# Patient Record
Sex: Male | Born: 1970 | Race: White | Hispanic: No | Marital: Single | Smoking: Never smoker
Health system: Southern US, Community
[De-identification: ages and names within clinical notes are randomized; demographics above are authoritative.]

## PROBLEM LIST (undated history)

## (undated) DIAGNOSIS — E559 Vitamin D deficiency, unspecified: Secondary | ICD-10-CM

## (undated) DIAGNOSIS — T4145XA Adverse effect of unspecified anesthetic, initial encounter: Secondary | ICD-10-CM

## (undated) DIAGNOSIS — R351 Nocturia: Secondary | ICD-10-CM

## (undated) DIAGNOSIS — M1990 Unspecified osteoarthritis, unspecified site: Secondary | ICD-10-CM

## (undated) DIAGNOSIS — G4700 Insomnia, unspecified: Secondary | ICD-10-CM

## (undated) DIAGNOSIS — K589 Irritable bowel syndrome without diarrhea: Secondary | ICD-10-CM

## (undated) DIAGNOSIS — F54 Psychological and behavioral factors associated with disorders or diseases classified elsewhere: Secondary | ICD-10-CM

## (undated) DIAGNOSIS — I10 Essential (primary) hypertension: Secondary | ICD-10-CM

## (undated) DIAGNOSIS — F329 Major depressive disorder, single episode, unspecified: Secondary | ICD-10-CM

## (undated) DIAGNOSIS — Z9109 Other allergy status, other than to drugs and biological substances: Secondary | ICD-10-CM

## (undated) DIAGNOSIS — T8859XA Other complications of anesthesia, initial encounter: Secondary | ICD-10-CM

## (undated) DIAGNOSIS — F32A Depression, unspecified: Secondary | ICD-10-CM

## (undated) DIAGNOSIS — R631 Polydipsia: Secondary | ICD-10-CM

## (undated) DIAGNOSIS — R51 Headache: Secondary | ICD-10-CM

## (undated) DIAGNOSIS — R531 Weakness: Secondary | ICD-10-CM

## (undated) HISTORY — DX: Psychological and behavioral factors associated with disorders or diseases classified elsewhere: F54

## (undated) HISTORY — PX: COLONOSCOPY: SHX174

## (undated) HISTORY — DX: Polydipsia: R63.1

## (undated) HISTORY — DX: Irritable bowel syndrome, unspecified: K58.9

---

## 1999-08-12 HISTORY — PX: WISDOM TOOTH EXTRACTION: SHX21

## 2001-08-24 ENCOUNTER — Emergency Department (HOSPITAL_COMMUNITY): Admission: EM | Admit: 2001-08-24 | Discharge: 2001-08-24 | Payer: Self-pay | Admitting: Emergency Medicine

## 2005-08-18 ENCOUNTER — Ambulatory Visit: Payer: Self-pay | Admitting: Internal Medicine

## 2005-08-31 ENCOUNTER — Ambulatory Visit: Payer: Self-pay | Admitting: Gastroenterology

## 2005-09-28 ENCOUNTER — Ambulatory Visit: Payer: Self-pay | Admitting: Gastroenterology

## 2006-03-21 ENCOUNTER — Ambulatory Visit: Payer: Self-pay | Admitting: Internal Medicine

## 2006-11-01 ENCOUNTER — Emergency Department (HOSPITAL_COMMUNITY): Admission: EM | Admit: 2006-11-01 | Discharge: 2006-11-01 | Payer: Self-pay | Admitting: Emergency Medicine

## 2007-12-27 ENCOUNTER — Ambulatory Visit: Payer: Self-pay | Admitting: Internal Medicine

## 2008-01-01 LAB — CONVERTED CEMR LAB
Basophils Absolute: 0 10*3/uL (ref 0.0–0.1)
Bilirubin, Direct: 0.1 mg/dL (ref 0.0–0.3)
CO2: 29 meq/L (ref 19–32)
Creatinine, Ser: 0.9 mg/dL (ref 0.4–1.5)
Eosinophils Absolute: 0.1 10*3/uL (ref 0.0–0.6)
Eosinophils Relative: 1.9 % (ref 0.0–5.0)
GFR calc Af Amer: 123 mL/min
Glucose, Bld: 106 mg/dL — ABNORMAL HIGH (ref 70–99)
HCT: 42.1 % (ref 39.0–52.0)
Hemoglobin: 15.1 g/dL (ref 13.0–17.0)
Iron: 96 ug/dL (ref 42–165)
Lipase: 22 units/L (ref 11.0–59.0)
Lymphocytes Relative: 32.1 % (ref 12.0–46.0)
MCHC: 36 g/dL (ref 30.0–36.0)
MCV: 90.7 fL (ref 78.0–100.0)
Monocytes Absolute: 0.6 10*3/uL (ref 0.2–0.7)
Neutro Abs: 3 10*3/uL (ref 1.4–7.7)
Neutrophils Relative %: 54.9 % (ref 43.0–77.0)
Potassium: 4.6 meq/L (ref 3.5–5.1)
Total Bilirubin: 0.7 mg/dL (ref 0.3–1.2)
Total Protein: 7.4 g/dL (ref 6.0–8.3)

## 2008-03-03 ENCOUNTER — Encounter: Payer: Self-pay | Admitting: Internal Medicine

## 2010-05-11 ENCOUNTER — Ambulatory Visit: Payer: Self-pay | Admitting: Internal Medicine

## 2010-05-11 DIAGNOSIS — R350 Frequency of micturition: Secondary | ICD-10-CM | POA: Insufficient documentation

## 2010-05-11 DIAGNOSIS — R109 Unspecified abdominal pain: Secondary | ICD-10-CM | POA: Insufficient documentation

## 2010-05-11 LAB — CONVERTED CEMR LAB
Blood in Urine, dipstick: NEGATIVE
Ketones, urine, test strip: NEGATIVE
Nitrite: NEGATIVE
Specific Gravity, Urine: 1.01
Urobilinogen, UA: 0.2
WBC Urine, dipstick: NEGATIVE

## 2010-05-12 LAB — CONVERTED CEMR LAB
Basophils Absolute: 0 10*3/uL (ref 0.0–0.1)
Basophils Relative: 0.3 % (ref 0.0–3.0)
Eosinophils Absolute: 0.1 10*3/uL (ref 0.0–0.7)
Hemoglobin: 15.2 g/dL (ref 13.0–17.0)
Lymphocytes Relative: 27.3 % (ref 12.0–46.0)
MCHC: 34.6 g/dL (ref 30.0–36.0)
MCV: 92.8 fL (ref 78.0–100.0)
Monocytes Absolute: 0.6 10*3/uL (ref 0.1–1.0)
Neutro Abs: 5.4 10*3/uL (ref 1.4–7.7)
RBC: 4.75 M/uL (ref 4.22–5.81)
RDW: 13.4 % (ref 11.5–14.6)

## 2010-08-10 ENCOUNTER — Encounter (INDEPENDENT_AMBULATORY_CARE_PROVIDER_SITE_OTHER): Payer: Self-pay | Admitting: *Deleted

## 2010-09-05 ENCOUNTER — Ambulatory Visit: Payer: Self-pay | Admitting: Internal Medicine

## 2010-09-05 DIAGNOSIS — L255 Unspecified contact dermatitis due to plants, except food: Secondary | ICD-10-CM | POA: Insufficient documentation

## 2011-01-10 NOTE — Assessment & Plan Note (Signed)
Summary: DR PAZ PT---POISON IVY 2-3 WEEKS///SP[facility]   Vital Signs:  Patient profile:   40 year old male Height:      66 inches Weight:      198.4 pounds BMI:     32.14 Temp:     98.5 degrees F oral Pulse rate:   72 / minute Resp:     13 per minute BP sitting:   118 / 76  (left arm) Cuff size:   large  Vitals Entered By: Shonna Chock CMA (September 05, 2010 1:34 PM) CC: ? 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  History of Present Illness: Poison ivy since 2 weeks ago after yardwork LLE. Rash has progressed despite  topical hydrocortisone OTC & OTC "wash". Groin rash in past 3 days ; PMH of "jock itch". No PMH of DM.  Current Medications (verified): 1)  None  Allergies (verified): No Known Drug Allergies  Review of Systems General:  Denies chills, fever, and sweats. Resp:  Denies shortness of breath and wheezing.  Physical Exam  General:  in no acute distress; alert,appropriate and cooperative throughout examination Lungs:  Normal respiratory effort, chest expands symmetrically. Lungs are clear to auscultation, no crackles or wheezes. Heart:  Normal rate and regular rhythm. S1 and S2 normal without gallop, murmur, click, rub or other extra sounds. Pulses:  R and L dorsalis pedis and posterior tibial pulses are full and equal bilaterally Extremities:  No clubbing, cyanosis, edema. Skin:  13X 5 cm irregular rash LLE above & posterior to ankle . ?Candida  R groin . Scattered papules . Scattered folliculitis lesions over abdomen Cervical Nodes:  No lymphadenopathy noted Axillary Nodes:  No palpable lymphadenopathy Psych:  memory intact for recent and remote, normally interactive, and good eye contact.     Impression & Recommendations:  Problem # 1:  RHUS DERMATITIS (ICD-692.6)  LLE but folliculitis lesions abdomen & ? Candida R groin  His updated medication list for this problem includes:    Mometasone Furoate 0.1 % Oint (Mometasone furoate) .Marland Kitchen... Apply two times a day to rash   Prednisone (pak) 10 Mg Tabs (Prednisone) .Marland Kitchen... As per pack ( 6 day)  Complete Medication List: 1)  Hydroxyzine Pamoate 25 Mg Caps (Hydroxyzine pamoate) .Marland Kitchen.. 1 every 6 hrs as needed itching 2)  Mometasone Furoate 0.1 % Oint (Mometasone furoate) .... Apply two times a day to rash 3)  Prednisone (pak) 10 Mg Tabs (Prednisone) .... As per pack ( 6 day)  Patient Instructions: 1)  Use OTC  Lamisil two times a day & blow dry for groin rash. Prescriptions: PREDNISONE (PAK) 10 MG TABS (PREDNISONE) as per pack ( 6 day)  #1 x 0   Entered and Authorized by:   Marga Melnick MD   Signed by:   Marga Melnick MD on 09/05/2010   Method used:   Print then Give to Patient   RxID:   [id number] MOMETASONE FUROATE 0.1 % OINT (MOMETASONE FUROATE) apply two times a day to rash  #60 grams x 0   Entered and Authorized by:   Marga Melnick MD   Signed by:   Marga Melnick MD on 09/05/2010   Method used:   Print then Give to Patient   RxID:   [id number] HYDROXYZINE PAMOATE 25 MG CAPS (HYDROXYZINE PAMOATE) 1 every 6 hrs as needed itching  #30 x 0   Entered and Authorized by:   Marga Melnick MD   Signed by:   Marga Melnick MD on 09/05/2010  Method used:   Print then Give to Patient   RxID:   [id number]

## 2011-01-10 NOTE — Letter (Signed)
Summary: Colonoscopy Letter  [facility]  [address] [place], [place] [address]   Phone: [phone]  Fax: [phone]      August 10, 2010 MRN: [id number]   [facility] [address] [place], [place]  [address]   Dear Mr. Reamy,   According to your medical record, it is time for you to schedule a Colonoscopy. The American Cancer Society recommends this procedure as a method to detect early colon cancer. Patients with a family history of colon cancer, or a personal history of colon polyps or inflammatory bowel disease are at increased risk.  This letter has beeen generated based on the recommendations made at the time of your procedure. If you feel that in your particular situation this may no longer apply, please contact our office.  Please call our office at [phone] to schedule this appointment or to update your records at your earliest convenience.  Thank you for cooperating with [place] to provide you with the very best care possible.   Sincerely,  Judie Petit T. Russella Dar, M.D.  [facility] [phone]

## 2011-01-10 NOTE — Assessment & Plan Note (Signed)
Summary: possible kidney problems//lch   Vital Signs:  Patient profile:   40 year old male Weight:      200.4 pounds Temp:     97.9 degrees F oral Pulse rate:   68 / minute Resp:     16 per minute BP sitting:   130 / 88  (left arm) Cuff size:   large  Vitals Entered By: Shonna Chock (May 11, 2010 1:21 PM) CC: 1.) Frequent Urination  2.) IBS Concerns   3.) Pain in rib area, Abdominal pain Comments REVIEWED MED LIST, PATIENT AGREED DOSE AND INSTRUCTION CORRECT    CC:  1.) Frequent Urination  2.) IBS Concerns   3.) Pain in rib area and Abdominal pain.  History of Present Illness:  Abdominal Pain      This is a 40 year old man who presents with Abdominal pain, acute on background of IBS.  The patient reports  loose stool w/o frank diarrhea, but denies nausea, vomiting, constipation ( until he took Immodium), melena, hematochezia, anorexia, and hematemesis.  The location of the pain is left posterior flank  / sidet.  The pain is described as intermittent and dull.  The patient denies the following symptoms: fever, weight loss, dysuria, chest pain, jaundice, and dark urine.  The pain was ?  worse with fruit .  The pain is better with fasting. Probiotics have helped but he stopped due to cost. No constellation of headache, chest pain, flushing & diarrhea.  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 ENT:  Denies difficulty swallowing and hoarseness. GI:  Denies excessive appetite, gas, indigestion, and loss of appetite. GU:  Denies discharge, hematuria, incontinence, and urinary frequency; Nocturia 1x. MS:  Complains of low  back pain and mid back pain; He sees [facility] monthly. Derm:  Denies lesion(s) and rash. Neuro:  Denies brief paralysis, numbness, tingling, and weakness; No radicualr pain. Psych:  Complains of anxiety; denies depression, easily angered, easily tearful, and irritability; Job stresses; OTC helped.  Physical Exam  General:  in no acute distress; alert,appropriate and cooperative throughout examination Eyes:  No corneal or conjunctival inflammation noted.No icterus Mouth:  Oral mucosa and oropharynx without lesions or exudates.  Teeth in good repair. No pharyngeal erythema.   Lungs:  Normal respiratory effort, chest expands symmetrically. Lungs are clear to auscultation, no crackles or wheezes. Heart:  Normal rate and regular rhythm. S1 and S2 normal without gallop, murmur, click, rub or other extra sounds. Abdomen:  Bowel sounds positive,abdomen soft and non-tender without masses, organomegaly or hernias noted. Msk:  Slight L flank tenderness to percussion Extremities:  No clubbing, cyanosis, edema. Boss dorsum of R hand ("from abrasion repeatedly"). Neg SLR Neurologic:  alert & oriented X3, strength normal in all extremities, and DTRs symmetrical and normal.   Skin:  Intact without suspicious lesions or rashes Cervical Nodes:  No lymphadenopathy noted Axillary Nodes:  No palpable lymphadenopathy Psych:  memory intact for recent and remote, normally interactive, and good eye contact.     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 1 under tongue every 6 hrs as needed for flank or abdominal pain  Patient Instructions: 1)  Monitor for  possible Gluten intolerance. Continue Probiotic once daily until bowels are normal. Prescriptions: HYOSCYAMINE SULFATE 0.125 MG SUBL (HYOSCYAMINE SULFATE) 1 under tongue every 6 hrs as needed for flank or abdominal pain  #30 x 5   Entered and Authorized by:   Marga Melnick MD   Signed by:   Marga Melnick MD on 05/11/2010   Method used:   Print then Give to Patient   RxID:   [id number]   Laboratory Results   Urine Tests    Routine Urinalysis   Color: yellow Appearance: Clear Glucose: negative   (Normal Range: Negative) Bilirubin: negative   (Normal Range: Negative) Ketone: negative   (Normal Range: Negative) Spec. Gravity: 1.010   (Normal Range: 1.003-1.035) Blood: negative   (Normal Range: Negative) pH: 6.5   (Normal Range: 5.0-8.0) Protein: negative   (Normal Range: Negative) Urobilinogen: 0.2   (Normal Range: 0-1) Nitrite: negative   (Normal Range: Negative) Leukocyte Esterace: negative   (Normal Range: Negative)

## 2011-01-26 ENCOUNTER — Encounter: Payer: Self-pay | Admitting: Internal Medicine

## 2011-02-03 ENCOUNTER — Encounter: Payer: Self-pay | Admitting: Internal Medicine

## 2011-02-21 NOTE — Letter (Signed)
Summary: [facility]  [facility]   Imported By: Maryln Gottron 02/14/2011 14:08:37  _____________________________________________________________________  External Attachment:    Type:   Image     Comment:   External Document

## 2011-03-13 ENCOUNTER — Ambulatory Visit (INDEPENDENT_AMBULATORY_CARE_PROVIDER_SITE_OTHER): Payer: Self-pay | Admitting: Internal Medicine

## 2011-03-13 ENCOUNTER — Encounter: Payer: Self-pay | Admitting: Internal Medicine

## 2011-03-13 DIAGNOSIS — L259 Unspecified contact dermatitis, unspecified cause: Secondary | ICD-10-CM

## 2011-03-13 MED ORDER — PREDNISONE 10 MG PO TABS: ORAL_TABLET | ORAL | Status: AC

## 2011-03-13 MED ORDER — HYDROCORTISONE 2.5 % EX CREA: TOPICAL_CREAM | Freq: Two times a day (BID) | CUTANEOUS | Status: DC

## 2011-03-13 NOTE — Assessment & Plan Note (Signed)
Symptoms consistent with contact dermatitis, recommend to wash all his clothing and clean everything that was in contact with poison ivy. see prescriptions Will call if no better

## 2011-03-13 NOTE — Patient Instructions (Signed)
Call if no better in few days

## 2011-03-13 NOTE — Progress Notes (Signed)
  Subjective:    Patient ID: Edwin Vaughan, male    DOB: 01-12-1971, 40 y.o.   MRN: [id number]  HPI Was in contact  with poison ivy 4 weeks ago, since then having a  on and off pruritic rash Has used a steroid cream with some help.  Past Medical History  Diagnosis Date  . IBS (irritable bowel syndrome)    Past Surgical History  Procedure Date  . Wisdom tooth extraction    History   Social History  . Marital Status: Single    Spouse Name: N/A    Number of Children: 0  . Years of Education: N/A   Occupational History  . Not on file.   Social History Main Topics  . Smoking status: Never Smoker   . Smokeless tobacco: Not on file  . Alcohol Use: 0.0 oz/week     drinks rarely   . Drug Use: Not on file  . Sexually Active: Not on file   Other Topics Concern  . Not on file   Social History Narrative   No job at present,  Single,  No children       Review of Systems No fevers, nor trauma chest, feels well otherwise    Objective:   Physical Exam Alert oriented in no apparent distress Lungs, clear to auscultation bilaterally Cardiovascular, RRR with no murmur Skin, has several minute  lesions, some slightly elevated, red, not scaly, mostly in the abdomen, back and legs. There is no lesions between the fingers or in the wrists.       Assessment & Plan:

## 2011-04-14 ENCOUNTER — Other Ambulatory Visit: Payer: Self-pay | Admitting: *Deleted

## 2011-04-14 MED ORDER — HYDROCORTISONE 2.5 % EX CREA: TOPICAL_CREAM | Freq: Two times a day (BID) | CUTANEOUS | Status: AC

## 2011-04-14 NOTE — Telephone Encounter (Signed)
Per Dr.Paz.

## 2011-04-28 NOTE — Assessment & Plan Note (Signed)
[facility]                                   ON-CALL NOTE   NAME:Edwin Vaughan, Edwin Vaughan                         MRN:          [id number]  DATE:11/01/2006                            DOB:          1971-08-02    DATE OF INTERACTION:  November 01, 2006, at 7:42 a.m.   The caller was Joycelyn Schmid, phone number [phone].   OBJECTIVE:  Message on the machine was difficulty breathing.  Got no  answer with multiple attempts at the phone number.  Had a machine answer the  phone calls.  Left a message on the machine to call me back, which they  never did.  Primary care Richard Holz is Dr. Drue Novel, home office is [place].     Arta Silence, MD  Electronically Signed    RNS/MedQ  DD: 11/02/2006  DT: 11/02/2006  Job #: [id number]

## 2012-03-18 ENCOUNTER — Ambulatory Visit (INDEPENDENT_AMBULATORY_CARE_PROVIDER_SITE_OTHER): Payer: BC Managed Care – PPO | Admitting: Internal Medicine

## 2012-03-18 ENCOUNTER — Encounter: Payer: Self-pay | Admitting: Internal Medicine

## 2012-03-18 VITALS — BP 122/80 | HR 72 | Temp 98.6°F | Wt 183.8 lb

## 2012-03-18 DIAGNOSIS — L255 Unspecified contact dermatitis due to plants, except food: Secondary | ICD-10-CM

## 2012-03-18 DIAGNOSIS — L237 Allergic contact dermatitis due to plants, except food: Secondary | ICD-10-CM

## 2012-03-18 DIAGNOSIS — L719 Rosacea, unspecified: Secondary | ICD-10-CM

## 2012-03-18 MED ORDER — METRONIDAZOLE 0.75 % EX GEL: Freq: Two times a day (BID) | CUTANEOUS | Status: DC

## 2012-03-18 MED ORDER — MOMETASONE FUROATE 0.1 % EX OINT: TOPICAL_OINTMENT | Freq: Two times a day (BID) | CUTANEOUS | Status: DC

## 2012-03-18 NOTE — Progress Notes (Signed)
  Subjective:    Patient ID: Edwin Vaughan, male    DOB: 01-25-1971, 41 y.o.   MRN: [id number]  HPI He was weeding 7-10 days ago &  was exposed to poison ivy. It involved in his extremities and groin area. He's been using over-the-counter topical agents with marginal response    Review of Systems  His acne since his teens. He questions a possible relationship of that rash to irritable bowel syndrome. Probiotics have not helped this. He is using nutritional program focusing on avoiding dairy and sugars.     Objective:   Physical Exam   He is healthy and well-nourished  There is no lymphadenopathy about the neck or axilla  He appears to have classic acne rosacea across the nose and maxillary areas.  There is erythema of the scrotum but no significant visible lesions.  He is scattered contact dermatitis lesions over the extremities; the greatest collection is over the left biceps and right upper leg        Assessment & Plan:  #1 contact dermatitis from poison ivy  #2  acne rosacea  Plan: See orders and recommendations

## 2012-03-18 NOTE — Patient Instructions (Signed)
Bathe with  liquid hypoallergenic , not soap bar soap. Do not apply the steroid ointment to the face.

## 2012-03-22 ENCOUNTER — Telehealth: Payer: Self-pay | Admitting: *Deleted

## 2012-03-22 MED ORDER — PREDNISONE 20 MG PO TABS: 10.0000 mg | ORAL_TABLET | Freq: Three times a day (TID) | ORAL | Status: AC

## 2012-03-22 NOTE — Telephone Encounter (Signed)
Prednisone 20 mg one half 3 times a day dispense  # 9

## 2012-03-22 NOTE — Telephone Encounter (Signed)
Patient was seen in office 04.08.13 and given topical TX for poison ivy; states that topical will not work and request Oral Rx for itching as given in past. Please advise.

## 2012-03-22 NOTE — Telephone Encounter (Signed)
Discuss with patient, Rx sent. 

## 2012-07-01 ENCOUNTER — Ambulatory Visit (INDEPENDENT_AMBULATORY_CARE_PROVIDER_SITE_OTHER): Payer: BC Managed Care – PPO | Admitting: Internal Medicine

## 2012-07-01 VITALS — BP 118/76 | HR 69 | Temp 98.4°F | Wt 189.0 lb

## 2012-07-01 DIAGNOSIS — R221 Localized swelling, mass and lump, neck: Secondary | ICD-10-CM | POA: Insufficient documentation

## 2012-07-01 DIAGNOSIS — R220 Localized swelling, mass and lump, head: Secondary | ICD-10-CM

## 2012-07-01 NOTE — Assessment & Plan Note (Signed)
1.5 cm right-sided mass of the neck, most likely a benign  lymphadenopathy however this is going on for 6 months and is not  decreasing in size. Plan: ENT referral. Biopsy?

## 2012-07-01 NOTE — Progress Notes (Signed)
  Subjective:    Patient ID: Edwin Vaughan, male    DOB: Nov 19, 1971, 41 y.o.   MRN: [id number]  HPI Acute visit Found a mass at the right side of the neck few months ago, since then the mass has not changed in size. He is a slightly tender. No discharge.  Past medical history IBS Rosacea  Past surgical history Wisdom tooth extraction  Review of Systems Denies any fever, chills, weight loss or night sweats. No other "knots" found    Objective:   Physical Exam  Constitutional: He appears well-developed. No distress.  Neck:    Skin: He is not diaphoretic.   axillary area without mass or knots       Assessment & Plan:

## 2012-07-16 ENCOUNTER — Ambulatory Visit (INDEPENDENT_AMBULATORY_CARE_PROVIDER_SITE_OTHER): Payer: BC Managed Care – PPO

## 2012-07-16 DIAGNOSIS — Z23 Encounter for immunization: Secondary | ICD-10-CM

## 2012-08-29 ENCOUNTER — Encounter: Payer: Self-pay | Admitting: Gastroenterology

## 2012-12-11 LAB — HM COLONOSCOPY: HM COLON: NORMAL

## 2013-06-14 ENCOUNTER — Ambulatory Visit (INDEPENDENT_AMBULATORY_CARE_PROVIDER_SITE_OTHER): Payer: BC Managed Care – PPO | Admitting: Family Medicine

## 2013-06-14 VITALS — BP 142/94 | HR 101 | Temp 97.9°F | Resp 18 | Ht 66.0 in | Wt 182.0 lb

## 2013-06-14 DIAGNOSIS — R030 Elevated blood-pressure reading, without diagnosis of hypertension: Secondary | ICD-10-CM

## 2013-06-14 DIAGNOSIS — R42 Dizziness and giddiness: Secondary | ICD-10-CM

## 2013-06-14 DIAGNOSIS — R51 Headache: Secondary | ICD-10-CM

## 2013-06-14 DIAGNOSIS — R5381 Other malaise: Secondary | ICD-10-CM

## 2013-06-14 DIAGNOSIS — I10 Essential (primary) hypertension: Secondary | ICD-10-CM

## 2013-06-14 LAB — POCT CBC
HCT, POC: 50.1 % (ref 43.5–53.7)
Hemoglobin: 16.2 g/dL (ref 14.1–18.1)
Lymph, poc: 1.5 (ref 0.6–3.4)
MCH, POC: 31.5 pg — AB (ref 27–31.2)
MCHC: 32.3 g/dL (ref 31.8–35.4)
POC Granulocyte: 4.7 (ref 2–6.9)
POC LYMPH PERCENT: 22.3 %L (ref 10–50)
POC MID %: 6.7 %M (ref 0–12)
RDW, POC: 14.1 %
WBC: 6.6 10*3/uL (ref 4.6–10.2)

## 2013-06-14 LAB — POCT URINALYSIS DIPSTICK
Bilirubin, UA: NEGATIVE
Ketones, UA: NEGATIVE
Protein, UA: NEGATIVE
pH, UA: 6

## 2013-06-14 MED ORDER — HYDROCHLOROTHIAZIDE 12.5 MG PO CAPS: 12.5000 mg | ORAL_CAPSULE | Freq: Every day | ORAL | Status: DC

## 2013-06-14 NOTE — Patient Instructions (Signed)
Make sure you drink plenty of fluids, and eating enough calories as this may be contributing to dizziness. If not improved dizziness with new medication and increasing fluids and calories - Return to the clinic or follow up with your primary provider. Keep your appointment with your primary provider. You should receive a call or letter about your lab results within the next week to 10 days.  Return to the clinic or go to the nearest emergency room if any of your symptoms worsen or new symptoms occur.

## 2013-06-14 NOTE — Progress Notes (Signed)
Subjective:    Patient ID: Edwin Vaughan, male    DOB: 25-Apr-1971, 42 y.o.   MRN: [id number]  HPI Edwin Vaughan is a 42 y.o. male  Elevated blood pressure past few days. No personal hx of HTN, but mom has HTN and hx of CVA.  Noticed initially late last week - headaches (hx of these in past with neck/back pain in past).  Checked BP at [facility] 4 days - 127/93. Other outside BP's on his machine: 149/95, 159/96, 203/105, 146/94, 150/89, 163/102, 150/91, 144/93.    BP has been ok at primary office - Dr. Drue Novel. Has appt there in 3 days.  Feeling more tired, so here sooner. BP's on Epic - 118/76, 122/80, 138/74. Yoga at times, no other regular exercise.   Not eating much past few days - headaches at times and fatigue, sometimes dizzy - but has been eating less. Noted dizziness past few days.  No slurred speech. No focal weakness. Feels like balance a little off with dizziness, but again not eating much. Has been drinking fluids, but have been less with eating less. Ate some steak and cabbage yesterday. nothing today except water.  Hx of irritable bowel, so has to adjust his diet.  Fasts at times for this.  Has seen GI - did colonoscopy 2 years ago, ok by his report.  rx meds made sx's worse - unknown name.  Just treats with diet and supplements.  Garlic, oil of oregano, fish oil, quercetin - past year. Probiotic, enzymes for digestion.   Occasional upper L chest pain - past few years - no recent changes, once per month. No recent chest pain.  Hx of intermittent anxiety - relationship issues past few months - some increased sx's recently.  Slight depressed feeling with anxiety, no suicidal symptoms. On antidepressants in past - did not feel these were helpful.   Past Medical History  Diagnosis Date  . IBS (irritable bowel syndrome)   . Allergy    Past Surgical History  Procedure Laterality Date  . Wisdom tooth extraction     No Known Allergies Prior to Admission medications   Medication Sig  Start Date End Date Taking? Authorizing Provider  hydrochlorothiazide (MICROZIDE) 12.5 MG capsule Take 1 capsule (12.5 mg total) by mouth daily. 06/14/13   Shade Flood, MD   History   Social History  . Marital Status: Single    Spouse Name: N/A    Number of Children: 0  . Years of Education: N/A   Occupational History  . Not on file.   Social History Main Topics  . Smoking status: Never Smoker   . Smokeless tobacco: Not on file  . Alcohol Use: 0.0 oz/week     Comment: drinks rarely   . Drug Use: No  . Sexually Active: Not on file   Other Topics Concern  . Not on file   Social History Narrative   No job at present,  Single,  No children      Review of Systems  Constitutional: Negative for fatigue and unexpected weight change.  Eyes: Negative for visual disturbance.  Respiratory: Negative for cough, chest tightness and shortness of breath.   Cardiovascular: Positive for chest pain (in past.  none currently. ). Negative for palpitations and leg swelling.  Gastrointestinal: Negative for abdominal pain and blood in stool.  Skin: Negative for rash.  Neurological: Positive for dizziness, light-headedness and headaches. Negative for syncope and weakness.   Otherwise as above.  Objective:   Physical Exam  Vitals reviewed. Constitutional: He is oriented to person, place, and time. He appears well-developed and well-nourished.  HENT:  Head: Normocephalic and atraumatic.  Eyes: EOM are normal. Pupils are equal, round, and reactive to light.  Neck: No JVD present. Carotid bruit is not present. No thyromegaly present.  Cardiovascular: Normal rate, regular rhythm and normal heart sounds.   No murmur heard. Pulmonary/Chest: Effort normal and breath sounds normal. He has no rales.  Abdominal: Normal appearance and bowel sounds are normal. He exhibits no pulsatile midline mass. There is no tenderness. There is no CVA tenderness.  Musculoskeletal: He exhibits no edema.   Neurological: He is alert and oriented to person, place, and time. He has normal strength. He displays a negative Romberg sign. Coordination and gait normal.  Nonfocal, no pronator drift.   Skin: Skin is warm and dry.  Psychiatric: He has a normal mood and affect. His behavior is normal.    Manual BP with regular cuff R arm. 150/92.   EKG: SR, ? Early repol, no acute findings.   Results for orders placed in visit on 06/14/13  POCT CBC      Result Value Range   WBC 6.6  4.6 - 10.2 K/uL   Lymph, poc 1.5  0.6 - 3.4   POC LYMPH PERCENT 22.3  10 - 50 %L   MID (cbc) 0.4  0 - 0.9   POC MID % 6.7  0 - 12 %M   POC Granulocyte 4.7  2 - 6.9   Granulocyte percent 71.0  37 - 80 %G   RBC 5.15  4.69 - 6.13 M/uL   Hemoglobin 16.2  14.1 - 18.1 g/dL   HCT, POC 16.1  09.6 - 53.7 %   MCV 97.2 (*) 80 - 97 fL   MCH, POC 31.5 (*) 27 - 31.2 pg   MCHC 32.3  31.8 - 35.4 g/dL   RDW, POC 04.5     Platelet Count, POC 339  142 - 424 K/uL   MPV 8.2  0 - 99.8 fL  POCT URINALYSIS DIPSTICK      Result Value Range   Color, UA yellow     Clarity, UA clear     Glucose, UA neg     Bilirubin, UA neg     Ketones, UA neg     Spec Grav, UA 1.010     Blood, UA trace-lysed     pH, UA 6.0     Protein, UA neg     Urobilinogen, UA 0.2     Nitrite, UA neg     Leukocytes, UA Negative          Assessment & Plan:  Edwin Vaughan is a 42 y.o. male Dizziness, Other malaise and fatigue - Plan: POCT CBC, POCT urinalysis dipstick, Basic metabolic panel, TSH, EKG 12-Lead.  Reassuring EKG, CBC, and UA does not indicate volume depletion at present. May be multifactorial with HA and elevated BP's, nutritional with decreased food recently, and stress/anxiety component. Will check lytes, TSH, instructed to increase food intake, maintain hydration, rtc if not improving and to follow up with PCP. Also recommended discussing stress/anxiety/depression sx's with PCP, and effective coping strategies/stress reduction as did not find  antidepressants helpful in the past. CBT also option if he were interested   Headache, with new dx HTN (hypertension) - Plan: hydrochlorothiazide (MICROZIDE) 12.5 MG capsule.  reassuring exam, nonfocal neuro exam. Start hctz 12.5mg  qd, check outside BP's and follow  up with PCP.  TSH, BMP pending. rtc precautions discussed.   Meds ordered this encounter  Medications  . hydrochlorothiazide (MICROZIDE) 12.5 MG capsule    Sig: Take 1 capsule (12.5 mg total) by mouth daily.    Dispense:  30 capsule    Refill:  1    Patient Instructions  Make sure you drink plenty of fluids, and eating enough calories as this may be contributing to dizziness. If not improved dizziness with new medication and increasing fluids and calories - Return to the clinic or follow up with your primary provider. Keep your appointment with your primary provider. You should receive a call or letter about your lab results within the next week to 10 days.  Return to the clinic or go to the nearest emergency room if any of your symptoms worsen or new symptoms occur.

## 2013-06-15 LAB — BASIC METABOLIC PANEL
BUN: 7 mg/dL (ref 6–23)
CO2: 29 mEq/L (ref 19–32)
Calcium: 10 mg/dL (ref 8.4–10.5)
Chloride: 98 mEq/L (ref 96–112)
Creat: 0.81 mg/dL (ref 0.50–1.35)
Glucose, Bld: 96 mg/dL (ref 70–99)

## 2013-06-17 ENCOUNTER — Ambulatory Visit: Payer: BC Managed Care – PPO | Admitting: Internal Medicine

## 2013-06-17 ENCOUNTER — Encounter (HOSPITAL_COMMUNITY): Payer: Self-pay | Admitting: Physical Medicine and Rehabilitation

## 2013-06-17 ENCOUNTER — Emergency Department (HOSPITAL_COMMUNITY): Payer: BC Managed Care – PPO

## 2013-06-17 ENCOUNTER — Inpatient Hospital Stay (HOSPITAL_COMMUNITY)
Admission: EM | Admit: 2013-06-17 | Discharge: 2013-06-18 | DRG: 300 | Disposition: A | Discharge status: Home / Self Care | Payer: BC Managed Care – PPO | Attending: Internal Medicine | Admitting: Internal Medicine

## 2013-06-17 DIAGNOSIS — R824 Acetonuria: Secondary | ICD-10-CM | POA: Diagnosis present

## 2013-06-17 DIAGNOSIS — E871 Hypo-osmolality and hyponatremia: Secondary | ICD-10-CM | POA: Diagnosis present

## 2013-06-17 DIAGNOSIS — R51 Headache: Secondary | ICD-10-CM | POA: Diagnosis present

## 2013-06-17 DIAGNOSIS — E236 Other disorders of pituitary gland: Principal | ICD-10-CM | POA: Diagnosis present

## 2013-06-17 DIAGNOSIS — R4182 Altered mental status, unspecified: Secondary | ICD-10-CM | POA: Diagnosis present

## 2013-06-17 DIAGNOSIS — E861 Hypovolemia: Secondary | ICD-10-CM | POA: Diagnosis present

## 2013-06-17 DIAGNOSIS — R42 Dizziness and giddiness: Secondary | ICD-10-CM | POA: Diagnosis present

## 2013-06-17 DIAGNOSIS — G4700 Insomnia, unspecified: Secondary | ICD-10-CM | POA: Diagnosis present

## 2013-06-17 DIAGNOSIS — R5383 Other fatigue: Secondary | ICD-10-CM

## 2013-06-17 DIAGNOSIS — F411 Generalized anxiety disorder: Secondary | ICD-10-CM | POA: Diagnosis present

## 2013-06-17 DIAGNOSIS — M129 Arthropathy, unspecified: Secondary | ICD-10-CM | POA: Diagnosis present

## 2013-06-17 DIAGNOSIS — I10 Essential (primary) hypertension: Secondary | ICD-10-CM

## 2013-06-17 DIAGNOSIS — K589 Irritable bowel syndrome without diarrhea: Secondary | ICD-10-CM | POA: Diagnosis present

## 2013-06-17 HISTORY — DX: Unspecified osteoarthritis, unspecified site: M19.90

## 2013-06-17 HISTORY — DX: Headache: R51

## 2013-06-17 HISTORY — DX: Other allergy status, other than to drugs and biological substances: Z91.09

## 2013-06-17 HISTORY — DX: Essential (primary) hypertension: I10

## 2013-06-17 LAB — BASIC METABOLIC PANEL
CO2: 25 mEq/L (ref 19–32)
Calcium: 8.8 mg/dL (ref 8.4–10.5)
Chloride: 93 mEq/L — ABNORMAL LOW (ref 96–112)
Glucose, Bld: 96 mg/dL (ref 70–99)
Sodium: 127 mEq/L — ABNORMAL LOW (ref 135–145)

## 2013-06-17 LAB — CBC WITH DIFFERENTIAL/PLATELET
Basophils Absolute: 0 10*3/uL (ref 0.0–0.1)
Basophils Relative: 0 % (ref 0–1)
Eosinophils Absolute: 0 10*3/uL (ref 0.0–0.7)
Eosinophils Relative: 0 % (ref 0–5)
HCT: 42.9 % (ref 39.0–52.0)
MCH: 31.5 pg (ref 26.0–34.0)
MCHC: 36.8 g/dL — ABNORMAL HIGH (ref 30.0–36.0)
MCV: 85.5 fL (ref 78.0–100.0)
Monocytes Absolute: 0.7 10*3/uL (ref 0.1–1.0)
RDW: 12.5 % (ref 11.5–15.5)

## 2013-06-17 LAB — COMPREHENSIVE METABOLIC PANEL
AST: 26 U/L (ref 0–37)
Albumin: 4.4 g/dL (ref 3.5–5.2)
BUN: 8 mg/dL (ref 6–23)
Calcium: 9.6 mg/dL (ref 8.4–10.5)
Creatinine, Ser: 0.69 mg/dL (ref 0.50–1.35)

## 2013-06-17 LAB — POCT I-STAT, CHEM 8
BUN: 7 mg/dL (ref 6–23)
Calcium, Ion: 1.12 mmol/L (ref 1.12–1.23)
TCO2: 26 mmol/L (ref 0–100)

## 2013-06-17 LAB — URINALYSIS, ROUTINE W REFLEX MICROSCOPIC
Bilirubin Urine: NEGATIVE
Glucose, UA: NEGATIVE mg/dL
Hgb urine dipstick: NEGATIVE
Ketones, ur: 40 mg/dL — AB
Leukocytes, UA: NEGATIVE
Protein, ur: NEGATIVE mg/dL

## 2013-06-17 MED ORDER — SODIUM CHLORIDE 0.9 % IV SOLN
INTRAVENOUS | Status: DC
  Administered 2013-06-17 – 2013-06-18 (×3): via INTRAVENOUS

## 2013-06-17 MED ORDER — ACETAMINOPHEN 325 MG PO TABS: 650.0000 mg | ORAL_TABLET | Freq: Four times a day (QID) | ORAL | Status: DC | PRN

## 2013-06-17 MED ORDER — DIAZEPAM 5 MG PO TABS
5.0000 mg | ORAL_TABLET | Freq: Once | ORAL | Status: AC
  Administered 2013-06-17: 5 mg via ORAL
  Filled 2013-06-17: qty 1

## 2013-06-17 MED ORDER — ACETAMINOPHEN 650 MG RE SUPP: 650.0000 mg | Freq: Four times a day (QID) | RECTAL | Status: DC | PRN

## 2013-06-17 MED ORDER — ENOXAPARIN SODIUM 40 MG/0.4ML ~~LOC~~ SOLN
40.0000 mg | SUBCUTANEOUS | Status: DC
  Administered 2013-06-17: 40 mg via SUBCUTANEOUS
  Filled 2013-06-17 (×2): qty 0.4

## 2013-06-17 MED ORDER — SODIUM CHLORIDE 0.9 % IV BOLUS (SEPSIS)
500.0000 mL | Freq: Once | INTRAVENOUS | Status: AC
  Administered 2013-06-17: 500 mL via INTRAVENOUS

## 2013-06-17 NOTE — ED Notes (Signed)
Pt resting quietly at the time. States some relief from anxiety with valium. Vital signs stable. No signs of distress noted.

## 2013-06-17 NOTE — H&P (Signed)
Date: 06/17/2013               Patient Name:  Edwin Vaughan MRN: [id number]  DOB: [birth year] Age / Sex: 42 y.o., male   PCP: Wanda Plump, MD         Medical Service: Internal Medicine Teaching Service         Attending Physician: Dr. Rocco Serene, MD    First Contact: Dr. Darci Needle Pager: [phone]  Second Contact: Dr. Manson Passey Pager: [phone]       After Hours (After 5p/  First Contact Pager: [phone]  weekends / holidays): Second Contact Pager: [phone]   Chief Complaint: fatigue, hyponatremia  History of Present Illness:  This is a 42yo man with h/o IBS and newly diagnosed HTN who presents with c/o HA, lightheadedness, fatigue, and "brain fogginess" x 3 days. Patient c/o having an 8/10 intermittent bilateral posterior HA that he describes as "burning" x 2 weeks. He denies any associated symptoms such as photophobia, phonophobia, aura. He does not have history of similar HA. Patient states that he was having this HA on Saturday and had taken his BP at [facility], which was elevated to the 140s systolic, so he went to and urgent care center. At the urgent care center, patient's sodium was 134 and he was started on 12.5mg  HCTZ daily, he has taken three doses since this time. Patient reports that he has not slept well recently and notes he has had increasing fatigue, lightheadedness, and has had trouble keeping his balance over the past couple of days. He also feels as though he is more confused and that his brain feels "foggy." He reports having 1 episode of nonbloody emesis on Saturday night. Patient has been taking his blood pressure at home recently and BP has ranged from 140-160 systolic. He has had decreased PO over the past few days because he felt as though this would decrease his blood pressure.  In the ED, patient received CMP that revealed Na of 121 with repeat of 119, Cl 86 with repeat of 80; CBC was normal. UA showed ketones of 40, but was unremarkable otherwise. Head CT was wnl. He  received valium 5mg  once for anxiety.    Meds: Medications Prior to Admission  Medication Sig Dispense Refill  . GARLIC PO Take 1 capsule by mouth 2 (two) times daily.      . hydrochlorothiazide (MICROZIDE) 12.5 MG capsule Take 1 capsule (12.5 mg total) by mouth daily.  30 capsule  1  . OIL OF OREGANO PO Take 1 capsule by mouth 2 (two) times daily.      Marland Kitchen OVER THE COUNTER MEDICATION Take 1 tablet by mouth daily. Digest Spectrum for IBS      . OVER THE COUNTER MEDICATION Take 1 tablet by mouth daily. ACL with pepsin for IBS      . QUERCETIN PO Take 1 tablet by mouth daily.        Allergies: Allergies as of 06/17/2013 - Review Complete 06/17/2013  Allergen Reaction Noted  . Shellfish allergy Anaphylaxis 06/17/2013   Past Medical History  Diagnosis Date  . IBS (irritable bowel syndrome)   . Allergy    Past Surgical History  Procedure Laterality Date  . Wisdom tooth extraction     Family History  Problem Relation Age of Onset  . Hypertension Mother   . Stroke Mother   . Cancer Father    History   Social History  . Marital Status: Single  Spouse Name: N/A    Number of Children: 0  . Years of Education: N/A   Occupational History  . Not on file.   Social History Main Topics  . Smoking status: Never Smoker   . Smokeless tobacco: Not on file  . Alcohol Use: 0.0 oz/week     Comment: drinks rarely   . Drug Use: No  . Sexually Active: Not on file   Other Topics Concern  . Not on file   Social History Narrative   No job at present,  Single,  No children    Review of Systems: General: see HPI Skin: no rash HEENT: no HA currently, no blurry vision, hearing changes, sore throat Pulm: no dyspnea, coughing, wheezing CV: no chest pain, palpitations, shortness of breath Abd: no abdominal pain, nausea/vomiting, diarrhea/constipation GU: no dysuria, hematuria, polyuria Ext: no arthralgias, myalgias Neuro: See HPI  Physical Exam: Blood pressure 115/55, pulse 81,  temperature 98.2 F (36.8 C), temperature source Oral, resp. rate 20, weight 181 lb 3.5 oz (82.2 kg), SpO2 96.00%. General: alert, cooperative, appears anxious HEENT: pupils equal round and reactive to light, vision grossly intact, oropharynx clear and non-erythematous  Neck: supple, no lymphadenopathy Lungs: clear to ascultation bilaterally, normal work of respiration, no wheezes, rales, ronchi Heart: regular rate and rhythm, no murmurs, gallops, or rubs Abdomen: soft, non-tender, non-distended, normal bowel sounds Extremities:  no cyanosis, clubbing, or edema Neurologic: alert & oriented X3, cranial nerves II-XII intact, strength 5/5 throughout,  sensation intact to light touch, finger-nose intact  Lab results: Basic Metabolic Panel:  Recent Labs  28/41/32 1057 06/17/13 1559  NA 121* 119*  K 4.6 4.2  CL 86* 80*  CO2  --  24  GLUCOSE 116* 87  BUN 7 8  CREATININE 0.80 0.69  CALCIUM  --  9.6   Liver Function Tests:  Recent Labs  06/17/13 1559  AST 26  ALT 20  ALKPHOS 64  BILITOT 0.8  PROT 7.9  ALBUMIN 4.4   CBC:  Recent Labs  06/17/13 1057 06/17/13 1337  WBC  --  6.1  NEUTROABS  --  4.1  HGB 17.3* 15.8  HCT 51.0 42.9  MCV  --  85.5  PLT  --  292   Urinalysis:  Recent Labs  06/17/13 1145  COLORURINE YELLOW  LABSPEC 1.008  PHURINE 6.0  GLUCOSEU NEGATIVE  HGBUR NEGATIVE  BILIRUBINUR NEGATIVE  KETONESUR 40*  PROTEINUR NEGATIVE  UROBILINOGEN 0.2  NITRITE NEGATIVE  LEUKOCYTESUR NEGATIVE    Imaging results:  Ct Head Wo Contrast  06/17/2013   *RADIOLOGY REPORT*  Clinical Data: 42 year old male with persistent headache.  Pain radiating from the occiput to the frontal region with mild dizziness.  Hypertension.  CT HEAD WITHOUT CONTRAST  Technique:  Contiguous axial images were obtained from the base of the skull through the vertex without contrast.  Comparison: None.  Findings: Visualized paranasal sinuses and mastoids are clear.  No acute osseous  abnormality identified.  Visualized orbits and scalp soft tissues are within normal limits.  Normal cerebral volume.  No midline shift, ventriculomegaly, mass effect, evidence of mass lesion, intracranial hemorrhage or evidence of cortically based acute infarction.  Gray-white matter differentiation is within normal limits throughout the brain.  No suspicious intracranial vascular hyperdensity.  IMPRESSION: Normal noncontrast CT appearance of the brain.   Original Report Authenticated By: Erskine Speed, M.D.   Other results: EKG: no EKG done, but last EKG on 7/5 was normal.  Assessment & Plan by Problem:  This is a 42 yo with h/o IBS and newly diagnosed essential HTN started on HCTZ three days ago who presents with fatigue, confusion, decreased PO intake and hyponatremia.   # Hyponatremia: Patient has new onset of hyponatremia after starting HCTZ three days ago with increased fatigue, lightheadedness, and confusion. Patient has also had poor PO intake over the past few days; therefore, this is likely hypovolemic hyponatremia. Will initiate fluid resuscitation with the intent of slowly increasing patient's sodium level. Causes of hyponatremia include increase in water intake (primary polydipsia) or from increased release of ADH induced by reduced effective arterial blood volume, such as in SIADH, thiazide diuretics, adrenal insufficiency, or hypothyroidism. Patient is not hypotensive at this time, so do not favor adrenal insufficiency at this time He had a TSH checked on 7/5 which was 1.2 (wnl), so hypothyroidism is unlikely in this patient. SIADH is possible since patient appears euvolemic clinically, however, given the h/o decreased PO intake and recent use of HCTZ, will treat as hypovolemic hyponatremia initially.  - IVF- NS @ 125/hr, received 500cc NS bolus in ED-  NS should slowly raises the serum sodium by approximately 1 meq/L for every liter of fluid infused; this will remove the hypovolemia and  therefore decrease secretion of ADH, which is likely exacerbating the hyponatremia. -other options to consider if fluid resuscitation does not resolve the hyponatremia are: ADH antagonist, increased dietary salt or salt tablets -f/u CBC and BMet tomorrow AM  # Elevated anion gap: Patient's AG is 15, no acidosis present. Ketones present on UA. This likely represents a starvation ketoacidosis since the patient endorses decreased PO intake over the past few days.   # VTE: lovenox  # Diet: regular  Code status: Full  Dispo: Disposition is deferred at this time, awaiting improvement of current medical problems. Anticipated discharge in approximately 1-2 day(s).   The patient does have a current PCP Wanda Plump, MD) and does not need an [facility] hospital follow-up appointment after discharge.  The patient does not have transportation limitations that hinder transportation to clinic appointments.  Signed: Windell Hummingbird, MD 06/17/2013, 7:24 PM

## 2013-06-17 NOTE — ED Notes (Signed)
ED resident at bedside.

## 2013-06-17 NOTE — ED Provider Notes (Signed)
History    CSN: [id number] Arrival date & time 06/17/13  4098  First MD Initiated Contact with Patient 06/17/13 0901     Chief Complaint  Patient presents with  . Hypertension    HPI  Pt is a 42 yo Caucasian male with pmh of IBS and newly diagnosed essential hypertension who was recently started on HCTZ 3 days ago who presents with high blood pressure, headache, dizziness, and mental status changes. He reports starting HCTZ 12.5mg  daily on 7/5 and has taken 3 doses. His [facility] levels were 134 [facility].  He checks his bp at home and reports after starting medication it is still elevated in 140-160 systolic. He reports "brain fogginess"  and that he has not been feeling well since last night and did not sleep much. Also with aching 8/10 HA located at back of head without aura. One episode of NBNB vomiting on Saturday night.  No SOB, CP, extremity weakness, parasthesias, facial droop, slurring of speech, or vision changes.  Also reports fatigue, palpitations, decreased PO intake, and increased urination s/p diuretic use. Admits to consuming a lot of salty foods for past few weeks. He has had increased anxiety lately and is worried he has a stroke.           Past Medical History  Diagnosis Date  . IBS (irritable bowel syndrome)   . Hypertension     "just started RX this past weekend" (06/17/2013)  . Headache(784.0)     "most days recently related to my blood pressure" (06/17/2013)  . Arthritis     "lower back" (06/17/2013)  . Chronic lower back pain     "bulging disc; see chiropractor once/month" (06/17/2013)  . Environmental allergies     "pretty much anything that grows, I'm allergic to it" (06/17/2013)   Past Surgical History  Procedure Laterality Date  . Wisdom tooth extraction  2000's   Family History  Problem Relation Age of Onset  . Hypertension Mother   . Stroke Mother   . Cancer Father    History  Substance Use Topics  . Smoking status: Never Smoker   .  Smokeless tobacco: Never Used  . Alcohol Use: 0.0 oz/week     Comment: 06/17/2013 "1-2 drinks/yr"    Review of Systems  Constitutional: Positive for appetite change and fatigue. Negative for fever, chills, diaphoresis and unexpected weight change.  Eyes: Negative.   Respiratory: Negative.   Cardiovascular: Positive for palpitations. Negative for chest pain and leg swelling.  Gastrointestinal: Positive for vomiting (NBNB emesis x 1  3 days ago). Negative for diarrhea, constipation, abdominal distention and anal bleeding.       IBS  Endocrine: Negative.   Genitourinary:       Urine increased   Musculoskeletal:       Neck pain   Allergic/Immunologic: Positive for food allergies.  Neurological: Positive for dizziness and headaches. Negative for syncope.       Balance problem  Psychiatric/Behavioral: Positive for confusion. The patient is nervous/anxious.        Decreased sleep  All other systems reviewed and are negative.    Allergies  Shellfish allergy  Home Medications   No current outpatient prescriptions on file. BP 125/71  Pulse 80  Temp(Src) 97.4 F (36.3 C) (Oral)  Resp 18  Ht 5\' 6"  (1.676 m)  Wt 181 lb 3.5 oz (82.2 kg)  BMI 29.26 kg/m2  SpO2 100% Physical Exam  Constitutional: He is oriented to person,  place, and time. He appears well-developed and well-nourished. No distress.  anxious   HENT:  Head: Normocephalic and atraumatic.  Eyes: EOM are normal. Pupils are equal, round, and reactive to light.  Neck: Normal range of motion. Neck supple.  Cardiovascular: Regular rhythm and normal heart sounds.   Tachycardic   Pulmonary/Chest: Effort normal and breath sounds normal. No respiratory distress.  Abdominal: Soft. Bowel sounds are normal. He exhibits no distension. There is no tenderness.  Musculoskeletal: Normal range of motion.  Neurological: He is alert and oriented to person, place, and time. No cranial nerve deficit.  No speech disturbance Normal  extremity strength and sensation No facial droop  No pronator drift Normal gait   Skin: Skin is warm and dry. He is not diaphoretic. No pallor.    ED Course  Procedures (including critical care time) Labs Reviewed  URINALYSIS, ROUTINE W REFLEX MICROSCOPIC - Abnormal; Notable for the following:    Ketones, ur 40 (*)    All other components within normal limits  CBC WITH DIFFERENTIAL - Abnormal; Notable for the following:    MCHC 36.8 (*)    All other components within normal limits  COMPREHENSIVE METABOLIC PANEL - Abnormal; Notable for the following:    Sodium 119 (*)    Chloride 80 (*)    All other components within normal limits  BASIC METABOLIC PANEL - Abnormal; Notable for the following:    Sodium 127 (*)    Chloride 93 (*)    All other components within normal limits  POCT I-STAT, CHEM 8 - Abnormal; Notable for the following:    Sodium 121 (*)    Chloride 86 (*)    Glucose, Bld 116 (*)    Hemoglobin 17.3 (*)    All other components within normal limits  CBC  BASIC METABOLIC PANEL  OSMOLALITY, URINE  SODIUM, URINE, RANDOM  CREATININE, URINE, RANDOM  UREA NITROGEN, URINE  BASIC METABOLIC PANEL  BASIC METABOLIC PANEL   Ct Head Wo Contrast  06/17/2013   *RADIOLOGY REPORT*  Clinical Data: 42 year old male with persistent headache.  Pain radiating from the occiput to the frontal region with mild dizziness.  Hypertension.  CT HEAD WITHOUT CONTRAST  Technique:  Contiguous axial images were obtained from the base of the skull through the vertex without contrast.  Comparison: None.  Findings: Visualized paranasal sinuses and mastoids are clear.  No acute osseous abnormality identified.  Visualized orbits and scalp soft tissues are within normal limits.  Normal cerebral volume.  No midline shift, ventriculomegaly, mass effect, evidence of mass lesion, intracranial hemorrhage or evidence of cortically based acute infarction.  Gray-white matter differentiation is within normal limits  throughout the brain.  No suspicious intracranial vascular hyperdensity.  IMPRESSION: Normal noncontrast CT appearance of the brain.   Original Report Authenticated By: Erskine Speed, M.D.   1. Hyponatremia     MDM  Assessment: 42 yo Caucasian male with newly diagnosed essential hypertension started on HCTZ 3 days ago who presents with high blood pressure, headache, dizziness, and mental status changes.    Plan:  -Obtain chem-8 panel ---> hyponatremia (Na 121) ---> Administer 0.5L bolus NS and 148ml/hr NS to correct hypovolemia  -Obtain CBC w/ diff ----> WNL -Obtain UA---> ketonuria most likely due to decreased PO intake  -Obtain CT Head w/o contrast (pt very anxious and requested scan to r/o stroke)--> no evidence of mass effect, intracranial hemorrhage, or infarction -Administer PO Valium 5mg  for anxiety   Disposition: Inpatient medicine for treatment  and management of hyponatremia ---> hyponatremia most likely hypotonic hypovolemic hyponatremia due to diuretic use and decreased PO intake. No severe neurological deficits or seizures for indication of hypertonic saline bolus infusion. Given NS bolus and maintenance fluid to correct hypovolemia. Potassium WNL. Pt reports he is somewhat better after IV fluids and is currently in stable condition and ready for transfer.            Otis Brace, MD 06/17/13 1546  Otis Brace, MD 06/18/13 [id number]

## 2013-06-17 NOTE — ED Provider Notes (Signed)
I saw and evaluated the patient, reviewed the resident's note and I agree with the findings and plan.  Patient with increased blood pressure here as well as anxiety. Mild headache and some dizziness without focal findings.  Neurological exam stable. Heart regular lungs clear. Assessment plan patient to have a head CT in check renal function. Will give Valium  Toy Baker, MD 06/17/13 1018

## 2013-06-17 NOTE — Progress Notes (Signed)
06/17/2013 patient came from emergency room to 6700 at 1754, he is alert, oriented and ambulatory. Patient ambulated to bed without any difficulty. He stated fell at home while running across his yard, he feet gave way. Patient have a old bruise on left hip, sacrum is fine and red areas on right chest. Patient is not on telemetry. [facility] RN.

## 2013-06-17 NOTE — ED Notes (Signed)
Dr. Freida Busman at bedside. Updating patient on plan of care. Vital signs stable. No signs of distress noted. Denies pain at the time.

## 2013-06-17 NOTE — ED Notes (Signed)
Pt presents to department for evaluation of hypertension, dizziness and headache. Ongoing x2 weeks. States he was recently seen at [facility] and was prescribed HCTZ. Pt states he has been taking as directed, but continues to feel dizzy and lightheaded. Denies pain at the time. Respirations unlabored. Speaking complete sentences. Pt is alert and oriented x4.

## 2013-06-18 DIAGNOSIS — I10 Essential (primary) hypertension: Secondary | ICD-10-CM

## 2013-06-18 DIAGNOSIS — F411 Generalized anxiety disorder: Secondary | ICD-10-CM

## 2013-06-18 LAB — BASIC METABOLIC PANEL
BUN: 9 mg/dL (ref 6–23)
CO2: 26 mEq/L (ref 19–32)
GFR calc Af Amer: >90 mL/min (ref >90)
GFR calc non Af Amer: >90 mL/min (ref >90)
GFR calc non Af Amer: >90 mL/min (ref >90)
Glucose, Bld: 110 mg/dL — ABNORMAL HIGH (ref 70–99)
Glucose, Bld: 85 mg/dL (ref 70–99)
Potassium: 3.8 mEq/L (ref 3.5–5.1)
Potassium: 4.7 mEq/L (ref 3.5–5.1)
Sodium: 130 mEq/L — ABNORMAL LOW (ref 135–145)
Sodium: 131 mEq/L — ABNORMAL LOW (ref 135–145)

## 2013-06-18 LAB — SODIUM, URINE, RANDOM: Sodium, Ur: <10 mEq/L

## 2013-06-18 LAB — CBC
Hemoglobin: 14.4 g/dL (ref 13.0–17.0)
MCH: 31.6 pg (ref 26.0–34.0)
MCV: 87 fL (ref 78.0–100.0)
RBC: 4.55 MIL/uL (ref 4.22–5.81)
WBC: 5.1 10*3/uL (ref 4.0–10.5)

## 2013-06-18 LAB — CREATININE, URINE, RANDOM: Creatinine, Urine: 50.48 mg/dL

## 2013-06-18 MED ORDER — PAROXETINE HCL 20 MG PO TABS: 20.0000 mg | ORAL_TABLET | Freq: Every day | ORAL | Status: DC

## 2013-06-18 MED ORDER — PSYLLIUM 95 % PO PACK
1.0000 | PACK | Freq: Every day | ORAL | Status: DC
  Administered 2013-06-18: 1 via ORAL
  Filled 2013-06-18: qty 1

## 2013-06-18 MED ORDER — PAROXETINE HCL 20 MG PO TABS
20.0000 mg | ORAL_TABLET | Freq: Every day | ORAL | Status: DC
  Administered 2013-06-18: 20 mg via ORAL
  Filled 2013-06-18: qty 1

## 2013-06-18 NOTE — H&P (Signed)
I saw and evaluated the patient. I reviewed the resident's note and confirmed the resident's findings.  I agree with the assessment and plan as documented in the resident's note.  Briefly, [name] is a 42 yo man with a history of anxiety, irritable bowel syndrome, and possibly essential hypertension who presents with vague complaints of imbalance, fatigue, insomnia, and "brain feeling foggy" after starting HCTZ 12.5 mg PO QAM  for stage I hypertension.  He has also purposely decreased his oral intake in hopes of improving his blood pressure.  In the ED he was found to be hyponatremic with a sodium of 121.  He was admitted to the Internal Medicine Teaching Service for further assessment and care.  Evaluation suggested a hypovolemic hyponatremia secondary to the HCTZ and the decreased oral intake.  He was also found to be overly anxious, and when this was explored some it was discovered that he had been "depressed" as a teenager and treated with Prozac, had symptoms consisted with Panic Attacks on a few occasions, and had symptoms consistent with generalized anxiety.  His mild hypertension was not an issue during the hospitalization after receving therapy for his anxiety, despite discontinuing the HCTZ and hydrating with normal saline.  Examination is unremarkable.  I am concerned he has generalized anxiety and may benefit from SSRI therapy.  His blood pressures should be monitored after 3-4 weeks of SSRI therapy to see if they remain elevated.  If they do, HCTZ should be avoided as he has developed hypovolemic hyponatremia with this therapy (albiet in July during a 90+ degree heat wave).  If antihypertensive pharmacologic therapy is required in the future, he probably should be treated with an agent other than HCTZ.  His sodium is 130 this AM and his mental status is normal.  I agree with discharge home today with follow-up in Dr. Drue Novel' office to assess the success of the Paxil 20 mg PO QD in controlling his  generalized anxiety.

## 2013-06-18 NOTE — ED Provider Notes (Signed)
I saw and evaluated the patient, reviewed the resident's note and I agree with the findings and plan.  Toy Baker, MD 06/18/13 [phone]

## 2013-06-18 NOTE — Progress Notes (Signed)
Subjective: Patient reports that he slept poorly last night secondary to anxiety. Patient's sodium increased to 127 overnight (at 2200), so his NS infusion was decreased from 175cc/hr to 75cc/hr. The repeat Na level at 0600 this AM was found to be 130, so his NS infusion was stopped immediately. The patient has been having anxiety regarding his health recently and worries about his blood pressure, that me may have had a stroke or permanent brain damage from the hypertension. His blood pressure over night was 125-140/85-90. Counseled patient that his anxiety may be the cause or a least might be contributing to the increased blood pressure readings he has had at home. Discussed with patient that he might benefit from treatment with an SSRI for both his anxiety and his IBS. Patient is amenable to starting an SSRI and agrees to follow up with his PCP for this. Patient denies HA, but reports feeling a little "tingly all over."   Objective: Vital signs in last 24 hours: Filed Vitals:   06/17/13 1817 06/17/13 1819 06/17/13 2113 06/18/13 0459  BP: 128/85 125/90 140/90 125/71  Pulse: 83 89 87 80  Temp:   98.4 F (36.9 C) 97.4 F (36.3 C)  TempSrc:   Oral Oral  Resp: 20 20 18 18   Height:   5\' 6"  (1.676 m)   Weight:   181 lb 3.5 oz (82.2 kg)   SpO2: 98% 99% 100% 100%    Intake/Output Summary (Last 24 hours) at 06/18/13 1027 Last data filed at 06/18/13 0843  Gross per 24 hour  Intake    720 ml  Output      0 ml  Net    720 ml   Physical Exam:  Blood pressure 115/55, pulse 81, temperature 98.2 F (36.8 C), temperature source Oral, resp. rate 20, weight 181 lb 3.5 oz (82.2 kg), SpO2 96.00%.  General: alert, cooperative, appears anxious HEENT: pupils equal round and reactive to light, vision grossly intact, oropharynx clear and non-erythematous  Neck: supple, no lymphadenopathy Lungs: clear to ascultation bilaterally, normal work of respiration, no wheezes, rales, ronchi Heart: regular rate and  rhythm, no murmurs, gallops, or rubs Abdomen: soft, non-tender, non-distended, normal bowel sounds  Extremities: no cyanosis, clubbing, or edema Neurologic: alert & oriented X3, cranial nerves II-XII intact, strength 5/5 throughout, sensation intact to light touch  Lab Results: Basic Metabolic Panel:  Recent Labs Lab 06/17/13 2221 06/18/13 0610  NA 127* 130*  K 3.8 3.8  CL 93* 97  CO2 25 26  GLUCOSE 96 110*  BUN 12 9  CREATININE 0.72 0.78  CALCIUM 8.8 8.9   Liver Function Tests:  Recent Labs Lab 06/17/13 1559  AST 26  ALT 20  ALKPHOS 64  BILITOT 0.8  PROT 7.9  ALBUMIN 4.4   CBC:  Recent Labs Lab 06/17/13 1337 06/18/13 0610  WBC 6.1 5.1  NEUTROABS 4.1  --   HGB 15.8 14.4  HCT 42.9 39.6  MCV 85.5 87.0  PLT 292 247   Thyroid Function Tests:  Recent Labs Lab 06/14/13 1705  TSH 1.203   Urinalysis:  Recent Labs Lab 06/14/13 1706 06/17/13 1145  COLORURINE  --  YELLOW  LABSPEC  --  1.008  PHURINE  --  6.0  GLUCOSEU  --  NEGATIVE  HGBUR  --  NEGATIVE  BILIRUBINUR neg NEGATIVE  KETONESUR  --  40*  PROTEINUR  --  NEGATIVE  UROBILINOGEN 0.2 0.2  NITRITE neg NEGATIVE  LEUKOCYTESUR Negative NEGATIVE   Micro Results: No  results found for this or any previous visit (from the past 240 hour(s)). Studies/Results: Ct Head Wo Contrast  06/17/2013   *RADIOLOGY REPORT*  Clinical Data: 42 year old male with persistent headache.  Pain radiating from the occiput to the frontal region with mild dizziness.  Hypertension.  CT HEAD WITHOUT CONTRAST  Technique:  Contiguous axial images were obtained from the base of the skull through the vertex without contrast.  Comparison: None.  Findings: Visualized paranasal sinuses and mastoids are clear.  No acute osseous abnormality identified.  Visualized orbits and scalp soft tissues are within normal limits.  Normal cerebral volume.  No midline shift, ventriculomegaly, mass effect, evidence of mass lesion, intracranial hemorrhage  or evidence of cortically based acute infarction.  Gray-white matter differentiation is within normal limits throughout the brain.  No suspicious intracranial vascular hyperdensity.  IMPRESSION: Normal noncontrast CT appearance of the brain.   Original Report Authenticated By: Erskine Speed, M.D.   Medications: I have reviewed the patient's current medications. Scheduled Meds: . enoxaparin (LOVENOX) injection  40 mg Subcutaneous Q24H  . PARoxetine  20 mg Oral Daily  . psyllium  1 packet Oral Daily     PRN Meds:.acetaminophen, acetaminophen  Assessment/Plan:  This is a 42 yo with h/o IBS and newly diagnosed essential HTN started on HCTZ three days ago who presents with fatigue, confusion, decreased PO intake and hyponatremia.   # Hyponatremia: Patient has new onset of hyponatremia after starting HCTZ three days ago with increased fatigue, lightheadedness, and confusion. Patient has also had poor PO intake over the past few days; therefore, this is likely hypovolemic hyponatremia. Patient is asymptomatic this morning. - IVF- received 500cc NS bolus in ED- NS was originally started at 125 cc/hr, but the sodium decreased after a few hours of this intervention, so NS was subsequently increased to 175cc/hr; however, the sodium quickly increased from 119 to 127 last night, so the fluids were decreased to 75cc/hr by the night team. This morning, a repeat NA level was 130, so his NS infusion was stopped immediately.   # Anxiety- This morning the patient endorses having some chronic anxiety, which interferes with his sleep. He has had a few episodes of feeling panic and impending doom in the past. Patient's anxiety is likely contributing to the increase in blood pressure he has observed over the past few weeks. -add Paxil 20mg  daily today with close follow up with his PCP, Dr. Drue Novel   #HTN- Discontinued HCTZ. Patient's HTN might be exacerbated by his anxiety. Will initiate treatment for anxiety Patient will  follow up with PCP for management of HTN.  # Elevated anion gap: Patient's AG is 15, no acidosis present. Ketones present on UA. This likely represents a starvation ketoacidosis since the patient endorses decreased PO intake over the past few days.   # VTE: lovenox   # Diet: regular   Code status: Full  Dispo: Discharge to home today.  The patient does have a current PCP Wanda Plump, MD) and does not need an [facility] hospital follow-up appointment after discharge.  The patient does not have transportation limitations that hinder transportation to clinic appointments.  .Services Needed at time of discharge: Y = Yes, Blank = No PT:   OT:   RN:   Equipment:   Other:     LOS: 1 day   Windell Hummingbird, MD 06/18/2013, 10:27 AM

## 2013-06-18 NOTE — Progress Notes (Signed)
Pt. Got d/c orders and follow appointment.Prescription was call to the pharmacy.IV was removed.Pt. Ready to d/c home

## 2013-06-19 LAB — UREA NITROGEN, URINE: Urea Nitrogen, Ur: 225 mg/dL

## 2013-06-19 NOTE — Discharge Summary (Signed)
Name: Edwin Vaughan MRN: [id number] DOB: Jun 10, 1971 42 y.o. PCP: Edwin Plump, MD  Date of Admission: 06/17/2013  8:59 AM Date of Discharge: 06/19/2013 Attending Physician: Dr. Josem Kaufmann  Discharge Diagnosis: Principal Problem:   Hyponatremia- 2/2 volume depletion and HCTZ use Active Problems:   Hypertension   Anxiety  Discharge Medications:   Medication List    STOP taking these medications       hydrochlorothiazide 12.5 MG capsule  Commonly known as:  MICROZIDE      TAKE these medications       GARLIC PO  Take 1 capsule by mouth 2 (two) times daily.     OIL OF OREGANO PO  Take 1 capsule by mouth 2 (two) times daily.     OVER THE COUNTER MEDICATION  Take 1 tablet by mouth daily. Digest Spectrum for IBS     OVER THE COUNTER MEDICATION  Take 1 tablet by mouth daily. ACL with pepsin for IBS     PARoxetine 20 MG tablet  Commonly known as:  PAXIL  Take 1 tablet (20 mg total) by mouth daily.     psyllium 58.6 % powder  Commonly known as:  METAMUCIL  Take 1 packet by mouth 3 (three) times daily.     QUERCETIN PO  Take 1 tablet by mouth daily.       Disposition and follow-up:   Edwin Vaughan was discharged from [facility] in Stable condition.  At the hospital follow up visit please address:  1.  Essential hypertension- Patient was normotensive during this admission, so work up of this diagnosis would be appropriate. There is likely a component of anxiety that is contributing to his elevated blood pressure. Please avoid thiazide diuretics.  2. Anxiety- Patient endorses having significant anxiety. Discharged patient with Paxil. Please follow up on whether or not this medication is improving his anxiety.  2.  Labs / imaging needed at time of follow-up: none  3.  Pending labs/ test needing follow-up: none  Follow-up Appointments:     Follow-up Information   Follow up with Edwin Ora, MD On 07/09/2013. (11:00 AM)    Contact information:   [address] [employer] [address] [place] [address] [phone]       Discharge Instructions: Discharge Orders   Future Appointments Provider Department Dept Phone   07/09/2013 11:00 AM Edwin Plump, MD [facility] at  [place] [phone]   Future Orders Complete By Expires     Call MD for:  extreme fatigue  As directed     Call MD for:  persistant dizziness or light-headedness  As directed     Call MD for:  persistant nausea and vomiting  As directed     Call MD for:  severe uncontrolled pain  As directed     Diet - low sodium heart healthy  As directed     Increase activity slowly  As directed       Procedures Performed:  Ct Head Wo Contrast  06/17/2013   *RADIOLOGY REPORT*  Clinical Data: 42 year old male with persistent headache.  Pain radiating from the occiput to the frontal region with mild dizziness.  Hypertension.  CT HEAD WITHOUT CONTRAST  Technique:  Contiguous axial images were obtained from the base of the skull through the vertex without contrast.  Comparison: None.  Findings: Visualized paranasal sinuses and mastoids are clear.  No acute osseous abnormality identified.  Visualized orbits and scalp soft tissues are within  normal limits.  Normal cerebral volume.  No midline shift, ventriculomegaly, mass effect, evidence of mass lesion, intracranial hemorrhage or evidence of cortically based acute infarction.  Gray-white matter differentiation is within normal limits throughout the brain.  No suspicious intracranial vascular hyperdensity.  IMPRESSION: Normal noncontrast CT appearance of the brain.   Original Report Authenticated By: Erskine Speed, M.D.   Admission HPI: This is a 42yo man with h/o IBS and newly diagnosed essential HTN who presents with c/o HA, lightheadedness, fatigue, and "brain fogginess" x 3 days. Patient c/o having an 8/10 intermittent bilateral posterior HA that he describes as "burning" x 2 weeks. He denies any associated symptoms such as  photophobia, phonophobia, aura. He does not have history of similar HA. Patient states that he was having this HA on Saturday and had taken his BP at [facility], which was elevated to the 140s systolic, so he went to and urgent care center. At the urgent care center, patient's sodium was 134 and he was started on 12.5mg  HCTZ daily, he has taken three doses since this time. Patient reports that he has not slept well recently and notes he has had increasing fatigue, lightheadedness, and has had trouble keeping his balance over the past couple of days. He also feels as though he is more confused and that his brain feels "foggy." He reports having 1 episode of nonbloody emesis on Saturday night. Patient has been taking his blood pressure at home recently and BP has ranged from 140-160 systolic. He has had decreased PO over the past few days because he felt as though this would decrease his blood pressure. In the ED, patient received CMP that revealed Na of 121 with repeat of 119; CBC was normal. UA showed ketones of 40, but was unremarkable otherwise. Head CT was wnl. He received valium 5mg  once for anxiety.   Hospital Course by problem list:   1. Hyponatremia- Patient presented with symptoms of fatigue, confusion, N/V, unsteady gait, and HA with sodium level of 121 in the ED, thought to be hypovolemic hyponatremia 2/2 diuretic use and decreased PO intake. Neurological exam was completely normal. A 500cc normal saline bolus was given and NS infusion was started at 125cc/hr. Patient's repeat Na a few hours later was 119, so his NS infusion was increased to 175cc/hr. Repeat Na a few hours later showed Na of 127, so his fluids were decreased to 75cc/hr. A subsequent Na level the next morning was 130, so his fluids were stopped immediately. Patient's symptoms improved during his admission. At the time of discharge he continued to have a normal neurological exam. Patient was instructed to discontinue taking the HCTZ and to  follow up with his PCP for his blood pressure management.   2. Anxiety- Patient appeared anxious at time of admission and he admitted to having increased anxiety regarding his health recently. He has been taking his blood pressure at home (140s-160s systolic at home) and since the readings were elevated he was worried that he had permanent brain damage or a stroke. Patient also reported having some previous episodes of panic with the feeling of "impending doom" years ago. Discussed with patient that starting an SSRI could help his anxiety as well as his IBS symptoms. Patient was amenable to starting this medication and was discharged on paxil 20mg  daily. He will follow up with his PCP regarding this new medication.  Discharge Vitals:   BP 131/82  Pulse 85  Temp(Src) 98 F (36.7 C) (Oral)  Resp  18  Ht 5\' 6"  (1.676 m)  Wt 181 lb 3.5 oz (82.2 kg)  BMI 29.26 kg/m2  SpO2 100%  Discharge Labs:  Results for orders placed during the hospital encounter of 06/17/13 (from the past 24 hour(s))  BASIC METABOLIC PANEL     Status: Abnormal   Collection Time    06/18/13  3:52 PM      Result Value Range   Sodium 131 (*) 135 - 145 mEq/L   Potassium 4.7  3.5 - 5.1 mEq/L   Chloride 98  96 - 112 mEq/L   CO2 27  19 - 32 mEq/L   Glucose, Bld 85  70 - 99 mg/dL   BUN 11  6 - 23 mg/dL   Creatinine, Ser 8.29  0.50 - 1.35 mg/dL   Calcium 9.0  8.4 - 56.2 mg/dL   GFR calc non Af Amer >90  >90 mL/min   GFR calc Af Amer >90  >90 mL/min  OSMOLALITY, URINE     Status: Abnormal   Collection Time    06/18/13  6:45 PM      Result Value Range   Osmolality, Ur 195 (*) 390 - 1090 mOsm/kg  UREA NITROGEN, URINE     Status: None   Collection Time    06/18/13  6:45 PM      Result Value Range   Urea Nitrogen, Ur 225      Signed: Windell Hummingbird, MD 06/19/2013, 11:24 AM   Time Spent on Discharge: 45 minutes Services Ordered on Discharge: none Equipment Ordered on Discharge: none

## 2013-06-21 ENCOUNTER — Encounter (HOSPITAL_COMMUNITY): Payer: Self-pay | Admitting: Emergency Medicine

## 2013-06-21 ENCOUNTER — Encounter (HOSPITAL_COMMUNITY): Payer: Self-pay | Admitting: *Deleted

## 2013-06-21 ENCOUNTER — Emergency Department (HOSPITAL_COMMUNITY)
Admission: EM | Admit: 2013-06-21 | Discharge: 2013-06-21 | Disposition: A | Discharge status: Home / Self Care | Payer: BC Managed Care – PPO | Source: Home / Self Care | Attending: Emergency Medicine | Admitting: Emergency Medicine

## 2013-06-21 ENCOUNTER — Inpatient Hospital Stay (HOSPITAL_COMMUNITY)
Admission: EM | Admit: 2013-06-21 | Discharge: 2013-06-24 | DRG: 297 | Disposition: A | Discharge status: Home / Self Care | Payer: BC Managed Care – PPO | Attending: Emergency Medicine | Admitting: Emergency Medicine

## 2013-06-21 DIAGNOSIS — K589 Irritable bowel syndrome without diarrhea: Secondary | ICD-10-CM | POA: Diagnosis present

## 2013-06-21 DIAGNOSIS — Z8719 Personal history of other diseases of the digestive system: Secondary | ICD-10-CM

## 2013-06-21 DIAGNOSIS — I10 Essential (primary) hypertension: Secondary | ICD-10-CM | POA: Diagnosis present

## 2013-06-21 DIAGNOSIS — M545 Low back pain, unspecified: Secondary | ICD-10-CM | POA: Diagnosis present

## 2013-06-21 DIAGNOSIS — E871 Hypo-osmolality and hyponatremia: Secondary | ICD-10-CM

## 2013-06-21 DIAGNOSIS — F3289 Other specified depressive episodes: Secondary | ICD-10-CM | POA: Diagnosis present

## 2013-06-21 DIAGNOSIS — R631 Polydipsia: Secondary | ICD-10-CM | POA: Diagnosis present

## 2013-06-21 DIAGNOSIS — R221 Localized swelling, mass and lump, neck: Secondary | ICD-10-CM

## 2013-06-21 DIAGNOSIS — R4789 Other speech disturbances: Secondary | ICD-10-CM | POA: Diagnosis present

## 2013-06-21 DIAGNOSIS — R030 Elevated blood-pressure reading, without diagnosis of hypertension: Secondary | ICD-10-CM | POA: Diagnosis present

## 2013-06-21 DIAGNOSIS — M129 Arthropathy, unspecified: Secondary | ICD-10-CM | POA: Diagnosis present

## 2013-06-21 DIAGNOSIS — F419 Anxiety disorder, unspecified: Secondary | ICD-10-CM

## 2013-06-21 DIAGNOSIS — F489 Nonpsychotic mental disorder, unspecified: Secondary | ICD-10-CM | POA: Diagnosis present

## 2013-06-21 DIAGNOSIS — F329 Major depressive disorder, single episode, unspecified: Secondary | ICD-10-CM | POA: Diagnosis present

## 2013-06-21 DIAGNOSIS — G4700 Insomnia, unspecified: Secondary | ICD-10-CM | POA: Diagnosis present

## 2013-06-21 DIAGNOSIS — F54 Psychological and behavioral factors associated with disorders or diseases classified elsewhere: Secondary | ICD-10-CM

## 2013-06-21 DIAGNOSIS — G8929 Other chronic pain: Secondary | ICD-10-CM | POA: Diagnosis present

## 2013-06-21 DIAGNOSIS — R51 Headache: Secondary | ICD-10-CM | POA: Diagnosis present

## 2013-06-21 DIAGNOSIS — Z79899 Other long term (current) drug therapy: Secondary | ICD-10-CM

## 2013-06-21 DIAGNOSIS — F411 Generalized anxiety disorder: Secondary | ICD-10-CM

## 2013-06-21 DIAGNOSIS — L259 Unspecified contact dermatitis, unspecified cause: Secondary | ICD-10-CM

## 2013-06-21 LAB — CBC
HCT: 42 % (ref 39.0–52.0)
Hemoglobin: 15.5 g/dL (ref 13.0–17.0)
WBC: 8.2 10*3/uL (ref 4.0–10.5)

## 2013-06-21 LAB — RAPID URINE DRUG SCREEN, HOSP PERFORMED
Amphetamines: NOT DETECTED
Barbiturates: NOT DETECTED

## 2013-06-21 LAB — COMPREHENSIVE METABOLIC PANEL
ALT: 18 U/L (ref 0–53)
Albumin: 4.3 g/dL (ref 3.5–5.2)
Alkaline Phosphatase: 66 U/L (ref 39–117)
BUN: 6 mg/dL (ref 6–23)
Chloride: 85 mEq/L — ABNORMAL LOW (ref 96–112)
GFR calc Af Amer: >90 mL/min (ref >90)
Glucose, Bld: 117 mg/dL — ABNORMAL HIGH (ref 70–99)
Potassium: 3.5 mEq/L (ref 3.5–5.1)
Sodium: 121 mEq/L — ABNORMAL LOW (ref 135–145)
Total Bilirubin: 0.7 mg/dL (ref 0.3–1.2)

## 2013-06-21 LAB — POCT I-STAT, CHEM 8
Chloride: 93 mEq/L — ABNORMAL LOW (ref 96–112)
HCT: 48 % (ref 39.0–52.0)
Potassium: 3.5 mEq/L (ref 3.5–5.1)
Sodium: 129 mEq/L — ABNORMAL LOW (ref 135–145)

## 2013-06-21 LAB — ETHANOL: Alcohol, Ethyl (B): <11 mg/dL (ref 0–11)

## 2013-06-21 MED ORDER — ONDANSETRON HCL 4 MG PO TABS: 4.0000 mg | ORAL_TABLET | Freq: Four times a day (QID) | ORAL | Status: DC | PRN

## 2013-06-21 MED ORDER — SODIUM CHLORIDE 0.9 % IV SOLN: Freq: Once | INTRAVENOUS | Status: DC

## 2013-06-21 MED ORDER — ONDANSETRON HCL 4 MG/2ML IJ SOLN: 4.0000 mg | Freq: Three times a day (TID) | INTRAMUSCULAR | Status: DC | PRN

## 2013-06-21 MED ORDER — OXYCODONE HCL 5 MG PO TABS: 5.0000 mg | ORAL_TABLET | ORAL | Status: DC | PRN

## 2013-06-21 MED ORDER — HYDROMORPHONE HCL PF 1 MG/ML IJ SOLN: 0.5000 mg | INTRAMUSCULAR | Status: DC | PRN

## 2013-06-21 MED ORDER — ALUM & MAG HYDROXIDE-SIMETH 200-200-20 MG/5ML PO SUSP: 30.0000 mL | Freq: Four times a day (QID) | ORAL | Status: DC | PRN

## 2013-06-21 MED ORDER — SODIUM CHLORIDE 0.9 % IV BOLUS (SEPSIS)
1000.0000 mL | Freq: Once | INTRAVENOUS | Status: AC
  Administered 2013-06-21: 1000 mL via INTRAVENOUS

## 2013-06-21 MED ORDER — LORAZEPAM 1 MG PO TABS
1.0000 mg | ORAL_TABLET | Freq: Once | ORAL | Status: AC
  Administered 2013-06-21: 1 mg via ORAL
  Filled 2013-06-21: qty 1

## 2013-06-21 MED ORDER — SODIUM CHLORIDE 0.9 % IV SOLN: INTRAVENOUS | Status: DC

## 2013-06-21 MED ORDER — LORAZEPAM 2 MG/ML IJ SOLN: 1.0000 mg | Freq: Four times a day (QID) | INTRAMUSCULAR | Status: DC | PRN

## 2013-06-21 MED ORDER — ZOLPIDEM TARTRATE 5 MG PO TABS
5.0000 mg | ORAL_TABLET | Freq: Every evening | ORAL | Status: DC | PRN
  Administered 2013-06-22 – 2013-06-23 (×2): 5 mg via ORAL
  Filled 2013-06-21 (×2): qty 1

## 2013-06-21 MED ORDER — ONDANSETRON HCL 4 MG/2ML IJ SOLN: 4.0000 mg | Freq: Four times a day (QID) | INTRAMUSCULAR | Status: DC | PRN

## 2013-06-21 MED ORDER — ACETAMINOPHEN 325 MG PO TABS: 650.0000 mg | ORAL_TABLET | Freq: Four times a day (QID) | ORAL | Status: DC | PRN

## 2013-06-21 MED ORDER — LORAZEPAM 2 MG/ML IJ SOLN
1.0000 mg | Freq: Once | INTRAMUSCULAR | Status: AC
  Administered 2013-06-21: 1 mg via INTRAVENOUS
  Filled 2013-06-21: qty 1

## 2013-06-21 MED ORDER — ACETAMINOPHEN 650 MG RE SUPP: 650.0000 mg | Freq: Four times a day (QID) | RECTAL | Status: DC | PRN

## 2013-06-21 MED ORDER — DIAZEPAM 5 MG PO TABS: 5.0000 mg | ORAL_TABLET | Freq: Two times a day (BID) | ORAL | Status: DC | PRN

## 2013-06-21 NOTE — ED Notes (Addendum)
Pt ambulated to bathroom. Pt states that he still feels like he cannot sleep. Pt state that his heart does not feel like it is irregular anymore. EDP at bedside to discuss plan of care.

## 2013-06-21 NOTE — ED Provider Notes (Signed)
History    CSN: [id number] Arrival date & time 06/21/13  5621  First MD Initiated Contact with Patient 06/21/13 0258     Chief Complaint  Patient presents with  . Hypertension  . Irregular Heart Beat   HPI Edwin Vaughan is a 42 y.o. male with a past medical history significant for anxiety as well as hypertension was recently in the hospital for hyponatremia secondary to hydrochlorothiazide presents for rapid heartbeat and elevated blood pressures. Patient says he has not slept very well for last few days. He says he feels intermittently weak on the right side, he also had this complaint when he was admitted and it was achieved into the low sodium at the time. Patient was started on Paxil while in the hospital and is continued that however he's had no relief of his anxiety. Patient says "I feel tense all the time." He is most concerned about his occasional rapid heartbeat and elevated blood pressure, the symptoms and the constant, is not taking any other blood pressure medicines just Paxil. Denies any chest pain or shortness of breath, no abdominal pain, nausea, vomiting, diarrhea, rash, arthralgias or myalgias that are new.     Past Medical History  Diagnosis Date  . IBS (irritable bowel syndrome)   . Hypertension     "just started RX this past weekend" (06/17/2013)  . Headache(784.0)     "most days recently related to my blood pressure" (06/17/2013)  . Arthritis     "lower back" (06/17/2013)  . Chronic lower back pain     "bulging disc; see chiropractor once/month" (06/17/2013)  . Environmental allergies     "pretty much anything that grows, I'm allergic to it" (06/17/2013)   Past Surgical History  Procedure Laterality Date  . Wisdom tooth extraction  2000's   Family History  Problem Relation Age of Onset  . Hypertension Mother   . Stroke Mother   . Cancer Father    History  Substance Use Topics  . Smoking status: Never Smoker   . Smokeless tobacco: Never Used  . Alcohol Use: 0.0  oz/week     Comment: 06/17/2013 "1-2 drinks/yr"    Review of Systems At least 10pt or greater review of systems completed and are negative except where specified in the HPI.  Allergies  Shellfish allergy  Home Medications   Current Outpatient Rx  Name  Route  Sig  Dispense  Refill  . GARLIC PO   Oral   Take 1 capsule by mouth 2 (two) times daily.         . OIL OF OREGANO PO   Oral   Take 1 capsule by mouth 2 (two) times daily.         Marland Kitchen OVER THE COUNTER MEDICATION   Oral   Take 1 tablet by mouth daily. Digest Spectrum for IBS         . OVER THE COUNTER MEDICATION   Oral   Take 1 tablet by mouth daily. ACL with pepsin for IBS         . PARoxetine (PAXIL) 20 MG tablet   Oral   Take 1 tablet (20 mg total) by mouth daily.   30 tablet   1   . psyllium (METAMUCIL) 58.6 % powder   Oral   Take 1 packet by mouth 3 (three) times daily.         Marland Kitchen QUERCETIN PO   Oral   Take 1 tablet by mouth daily.  BP 144/100  Pulse 98  Temp(Src) 98 F (36.7 C) (Oral)  Resp 21  SpO2 100% Physical Exam  Nursing notes reviewed.  Electronic medical record reviewed. VITAL SIGNS:   Filed Vitals:   06/21/13 0246 06/21/13 0300 06/21/13 0400 06/21/13 0430  BP: 163/114 144/100 150/98 124/74  Pulse: 112 98 76 70  Temp: 98 F (36.7 C)     TempSrc: Oral     Resp: 16 21 22 12   SpO2: 99% 100% 99% 99%   CONSTITUTIONAL: Awake, oriented, appears non-toxic HENT: Atraumatic, normocephalic, oral mucosa pink and moist, airway patent. Nares patent without drainage. External ears normal. EYES: Conjunctiva clear, EOMI, PERRLA NECK: Trachea midline, non-tender, supple CARDIOVASCULAR: Tachycardia, Normal rhythm, No murmurs, rubs, gallops PULMONARY/CHEST: Clear to auscultation, no rhonchi, wheezes, or rales. Symmetrical breath sounds. Non-tender. ABDOMINAL: Non-distended, soft, non-tender - no rebound or guarding.  BS normal. NEUROLOGIC: Non-focal, moving all four extremities, no  gross sensory or motor deficits. EXTREMITIES: No clubbing, cyanosis, or edema SKIN: Warm, Dry, No erythema, No rash  ED Course  Procedures (including critical care time)  Date: 06/21/2013  Rate: 104  Rhythm: Sinus tachycardia  QRS Axis: normal  Intervals: normal  ST/T Wave abnormalities: normal  Conduction Disutrbances: none  Narrative Interpretation: Rate faster from prior tracing, sinus tachycardia   Labs Reviewed  POCT I-STAT, CHEM 8 - Abnormal; Notable for the following:    Sodium 129 (*)    Chloride 93 (*)    Glucose, Bld 118 (*)    Calcium, Ion 1.09 (*)    All other components within normal limits   No results found. 1. Anxiety   2. Hypertension   3. Hyponatremia     MDM  Patient with anxiety and insomnia. I do not think this patient has neurologic emergency at this time, do not think he's got acute coronary syndrome, checked EKG secondary to tachycardia and it showed sinus tachycardia, gave patient some Ativan and fluid bolus. Patient is mildly hyponatremic, he's not been eating very well secondary to his anxiety. Patient's only been on Paxil for a few days, we'll send him home with some Valium. Patient's blood pressure, heart rate improved dramatically with 1 mg of Ativan, in addition patient felt much better. Patient will follow up with Dr. York Grice, MD 06/21/13 [phone]

## 2013-06-21 NOTE — ED Notes (Signed)
Pt c/o anxiety and insomnia x 3 weeks. Pt c/o increased life stressors, seen and treated by doctor for same complaints. Pt denies relief from rx'd meds for anxiety and insomnia. Pt voiced SI w/o plan to EMS. Pt pleasant and cooperative in triage.

## 2013-06-21 NOTE — ED Notes (Addendum)
Pt states that he was seen on Sat 06/14/13 at urgent care for high BP. Pt states was given HCTZ for BP and on Tuesday was seen and admitting for hyponatremia. Pt states was released on Wed and has given paxil for his nerves but he has been unable to sleep due to anxiety and worried about  BP and is having heart palpations as well. Pt alert and oriented able to follow commands and move extremities no neuro deficits.

## 2013-06-21 NOTE — ED Provider Notes (Addendum)
History    CSN: [id number] Arrival date & time 06/21/13  1947  First MD Initiated Contact with Patient 06/21/13 2050     Chief Complaint  Patient presents with  . Medical Clearance   (Consider location/radiation/quality/duration/timing/severity/associated sxs/prior Treatment) HPI Comments: Patient presents with 3 weeks of anxiety and insomnia. He reports increased life stressors. A recent admission for hyponatremia. He was seen last night for the same symptoms. Reports taking Paxil and I am still not being able to sleep. He feels the Paxil and I'm not working he feels tense all the time. He denies any chest pain, shortness of breath abdominal pain, nausea, vomiting or diarrhea. He states he's been drinking a large amount of water. He denies any suicidal ideation contrary to triage note. He denies any homicidal ideation or hallucinations. His request is to "get some sleep".  The history is provided by the patient.   Past Medical History  Diagnosis Date  . IBS (irritable bowel syndrome)   . Hypertension     "just started RX this past weekend" (06/17/2013)  . Headache(784.0)     "most days recently related to my blood pressure" (06/17/2013)  . Arthritis     "lower back" (06/17/2013)  . Chronic lower back pain     "bulging disc; see chiropractor once/month" (06/17/2013)  . Environmental allergies     "pretty much anything that grows, I'm allergic to it" (06/17/2013)   Past Surgical History  Procedure Laterality Date  . Wisdom tooth extraction  2000's   Family History  Problem Relation Age of Onset  . Hypertension Mother   . Stroke Mother   . Cancer Father    History  Substance Use Topics  . Smoking status: Never Smoker   . Smokeless tobacco: Never Used  . Alcohol Use: 0.0 oz/week     Comment: 06/17/2013 "1-2 drinks/yr"    Review of Systems  Constitutional: Negative for fever, activity change and appetite change.  HENT: Negative for congestion and rhinorrhea.   Respiratory:  Negative for cough, chest tightness and shortness of breath.   Cardiovascular: Positive for palpitations. Negative for chest pain.  Gastrointestinal: Negative for nausea, vomiting and abdominal pain.  Genitourinary: Negative for dysuria.  Neurological: Negative for dizziness and headaches.  Psychiatric/Behavioral: Positive for sleep disturbance. Negative for suicidal ideas. The patient is nervous/anxious and is hyperactive.   A complete 10 system review of systems was obtained and all systems are negative except as noted in the HPI and PMH.    Allergies  Shellfish allergy  Home Medications   Current Outpatient Rx  Name  Route  Sig  Dispense  Refill  . diazepam (VALIUM) 5 MG tablet   Oral   Take 1 tablet (5 mg total) by mouth every 12 (twelve) hours as needed for anxiety or sleep.   17 tablet   0   . GARLIC PO   Oral   Take 1 capsule by mouth 2 (two) times daily.         . OIL OF OREGANO PO   Oral   Take 1 capsule by mouth 2 (two) times daily.         Marland Kitchen OVER THE COUNTER MEDICATION   Oral   Take 1 tablet by mouth 3 (three) times daily before meals. Digest Spectrum for IBS         . OVER THE COUNTER MEDICATION   Oral   Take 1 tablet by mouth 3 (three) times daily before meals. HCL with pepsin for  IBS         . PARoxetine (PAXIL) 20 MG tablet   Oral   Take 1 tablet (20 mg total) by mouth daily.   30 tablet   1   . psyllium (METAMUCIL) 58.6 % powder   Oral   Take 1 packet by mouth 3 (three) times daily before meals.          Marland Kitchen QUERCETIN PO   Oral   Take 1 tablet by mouth daily.          BP 151/92  Pulse 87  Temp(Src) 98.1 F (36.7 C) (Oral)  Resp 16  SpO2 100% Physical Exam  Constitutional: He is oriented to person, place, and time. He appears well-developed and well-nourished. No distress.  anxious  HENT:  Head: Normocephalic and atraumatic.  Mouth/Throat: Oropharynx is clear and moist. No oropharyngeal exudate.  Eyes: Conjunctivae and EOM are  normal. Pupils are equal, round, and reactive to light.  Neck: Normal range of motion. Neck supple.  Cardiovascular: Normal rate, regular rhythm and normal heart sounds.   No murmur heard. Pulmonary/Chest: Effort normal and breath sounds normal. No respiratory distress.  Abdominal: Soft. There is no tenderness. There is no rebound and no guarding.  Musculoskeletal: Normal range of motion. He exhibits no edema and no tenderness.  Neurological: He is alert and oriented to person, place, and time. No cranial nerve deficit. He exhibits normal muscle tone. Coordination normal.  CN 2-12 intact, no ataxia on finger to nose, no nystagmus, 5/5 strength throughout, no pronator drift, Romberg negative, normal gait.   Skin: Skin is warm.    ED Course  Procedures (including critical care time) Labs Reviewed  CBC - Abnormal; Notable for the following:    MCHC 36.9 (*)    All other components within normal limits  COMPREHENSIVE METABOLIC PANEL - Abnormal; Notable for the following:    Sodium 121 (*)    Chloride 85 (*)    Glucose, Bld 117 (*)    All other components within normal limits  ETHANOL  URINE RAPID DRUG SCREEN (HOSP PERFORMED)  TSH  T4, FREE  BASIC METABOLIC PANEL  CBC   No results found. 1. Hyponatremia     MDM  Patient complains of anxiety and insomnia. Recent admission for hyponatremia. Unrelieved by Paxil and Valium. Denies any suicidal ideation.  He appears anxious but has an otherwise normal neuro exam. Sodium today is 121. At 3 AM last night it was 129. Patient admits to increased by mouth intake. His hydrochlorothiazide diuretics were stopped during his last admission.  Denies any chest pain or shortness of breath.   Patient will need admission again for recurrent hyponatremia. Question psychogenic polydypsia as patient admits to drinking a lot of water.  Glynn Octave, MD 06/21/13 2317  Glynn Octave, MD 06/22/13 [phone]

## 2013-06-22 ENCOUNTER — Encounter (HOSPITAL_COMMUNITY): Payer: Self-pay | Admitting: Unknown Physician Specialty

## 2013-06-22 DIAGNOSIS — K589 Irritable bowel syndrome without diarrhea: Secondary | ICD-10-CM

## 2013-06-22 DIAGNOSIS — R030 Elevated blood-pressure reading, without diagnosis of hypertension: Secondary | ICD-10-CM

## 2013-06-22 DIAGNOSIS — F54 Psychological and behavioral factors associated with disorders or diseases classified elsewhere: Secondary | ICD-10-CM

## 2013-06-22 DIAGNOSIS — R631 Polydipsia: Secondary | ICD-10-CM

## 2013-06-22 HISTORY — DX: Polydipsia: R63.1

## 2013-06-22 HISTORY — DX: Psychological and behavioral factors associated with disorders or diseases classified elsewhere: F54

## 2013-06-22 LAB — CBC
HCT: 40.6 % (ref 39.0–52.0)
MCHC: 36.9 g/dL — ABNORMAL HIGH (ref 30.0–36.0)
Platelets: 315 10*3/uL (ref 150–400)
RDW: 12.6 % (ref 11.5–15.5)
WBC: 5.6 10*3/uL (ref 4.0–10.5)

## 2013-06-22 LAB — TSH: TSH: 1.465 u[IU]/mL (ref 0.350–4.500)

## 2013-06-22 LAB — BASIC METABOLIC PANEL
BUN: 6 mg/dL (ref 6–23)
CO2: 25 mEq/L (ref 19–32)
Calcium: 9.2 mg/dL (ref 8.4–10.5)
Chloride: 91 mEq/L — ABNORMAL LOW (ref 96–112)
Creatinine, Ser: 0.58 mg/dL (ref 0.50–1.35)
GFR calc Af Amer: >90 mL/min (ref >90)
GFR calc non Af Amer: >90 mL/min (ref >90)
Glucose, Bld: 119 mg/dL — ABNORMAL HIGH (ref 70–99)
Potassium: 5.8 mEq/L — ABNORMAL HIGH (ref 3.5–5.1)
Sodium: 124 mEq/L — ABNORMAL LOW (ref 135–145)

## 2013-06-22 LAB — NA AND K (SODIUM & POTASSIUM), RAND UR
Potassium Urine: 3 mEq/L
Sodium, Ur: 23 mEq/L

## 2013-06-22 LAB — OSMOLALITY, URINE: Osmolality, Ur: 101 mOsm/kg — ABNORMAL LOW (ref 390–1090)

## 2013-06-22 LAB — T4, FREE: Free T4: 1.27 ng/dL (ref 0.80–1.80)

## 2013-06-22 MED ORDER — SODIUM POLYSTYRENE SULFONATE 15 GM/60ML PO SUSP
30.0000 g | Freq: Once | ORAL | Status: AC
  Administered 2013-06-22: 30 g via ORAL
  Filled 2013-06-22: qty 120

## 2013-06-22 MED ORDER — BACID PO TABS
2.0000 | ORAL_TABLET | Freq: Three times a day (TID) | ORAL | Status: DC
  Administered 2013-06-22 (×3): 2 via ORAL
  Filled 2013-06-22 (×7): qty 2

## 2013-06-22 MED ORDER — HYDRALAZINE HCL 20 MG/ML IJ SOLN
5.0000 mg | Freq: Four times a day (QID) | INTRAMUSCULAR | Status: DC | PRN
  Administered 2013-06-22: 5 mg via INTRAVENOUS
  Filled 2013-06-22: qty 0.25

## 2013-06-22 MED ORDER — PSYLLIUM 95 % PO PACK
1.0000 | PACK | Freq: Every day | ORAL | Status: DC
  Administered 2013-06-22 – 2013-06-24 (×3): 1 via ORAL
  Filled 2013-06-22 (×3): qty 1

## 2013-06-22 NOTE — ED Notes (Signed)
Pt report has been called for tele psych and papers have been faxed.

## 2013-06-22 NOTE — [facility] Counselor (Signed)
Dr. Lanell Matar telepsych recommends discharge with follow up with community mental health provider. Copy of telepsych consult placed in pt's chart.  Evette Cristal, [place] Assessment Counselor

## 2013-06-22 NOTE — H&P (Addendum)
[facility] History and Physical  ZYEN TRIGGS ZOX:[id number] DOB: 12/30/1970 DOA: 06/21/2013  Referring physician:  EDP PCP: [name], MD  Specialists:   Chief Complaint:  Anxiety and Can not Sleep  HPI: Edwin Vaughan is a 42 y.o. male who presents to the ED with complaints of increased anxiety and difficulty sleeping for the past 3 weeks.  He reports increased stressors and a recent relationship breakup.   He also has had concern in regard to his blood pressures and was recently diagnosed with HTN and placed on HCTZ, which resulted in hyponatremia and dehydration in which he was hospitalized from 07/08 until 07/10  and the HCTZ was discontinued.   He was started on Paroxetine during his hospitalization for his anxiety depression and insomnia but he reports he did not have any relief, and he returned to the ED on 07/11 and was evaluated and given an Rx for Diazepam which he reports still has not given him any relief.   He returned this evening on 07/12 due to continued symptoms and he arrived by EMS, and on the survey of questions for interview he was asked if he was suicidal and he told the paramedics yes, as a fleeting thought, but he had no real plan.   So, he was evaluated for possible [facility] admission, but was found to have a sodium level of 121, whereas earlier in the day his sodium level had been 129.     When interviewed, the patient reports feeling increased thirst and states that he drinks 8 (0.5 liter)  Bottles of water daily or more because of his thirst.   He reports that water and coffee are the only liquids he can drink due to his IBS, and he reports that with his IBS, he has periods of diarrhea as well as periods of constipation.  He has a backpack full of  (0.5 liter) bottles of water.   He was referred for medical admission due to his hyponatremia.      Review of Systems: He reports having headaches, muscle weakness, diarrhea, constipation and polyuria.  The patient denies  anorexia, fever, chills, weight loss, vision loss, diplopia, dizziness, decreased hearing, rhinitis, hoarseness, chest pain, syncope, dyspnea on exertion, peripheral edema, balance deficits, cough, hemoptysis, abdominal pain, nausea, vomiting, n, hematemesis, melena, hematochezia, severe indigestion/heartburn, dysuria, hematuria, incontinence, suspicious skin lesions, transient blindness, difficulty walking, depression, unusual weight change, abnormal bleeding, enlarged lymph nodes, angioedema, and breast masses.    Past Medical History  Diagnosis Date  . IBS (irritable bowel syndrome)   . Hypertension     "just started RX this past weekend" (06/17/2013)  . Headache(784.0)     "most days recently related to my blood pressure" (06/17/2013)  . Arthritis     "lower back" (06/17/2013)  . Chronic lower back pain     "bulging disc; see chiropractor once/month" (06/17/2013)  . Environmental allergies     "pretty much anything that grows, I'm allergic to it" (06/17/2013)    Past Surgical History  Procedure Laterality Date  . Wisdom tooth extraction  2000's    Prior to Admission medications   Medication Sig Start Date End Date Taking? Authorizing Provider  diazepam (VALIUM) 5 MG tablet Take 1 tablet (5 mg total) by mouth every 12 (twelve) hours as needed for anxiety or sleep. 06/21/13  Yes John-Adam Bonk, MD  GARLIC PO Take 1 capsule by mouth 2 (two) times daily.   Yes Historical Provider, MD  OIL OF OREGANO PO  Take 1 capsule by mouth 2 (two) times daily.   Yes Historical Provider, MD  OVER THE COUNTER MEDICATION Take 1 tablet by mouth 3 (three) times daily before meals. Digest Spectrum for IBS   Yes Historical Provider, MD  OVER THE COUNTER MEDICATION Take 1 tablet by mouth 3 (three) times daily before meals. HCL with pepsin for IBS   Yes Historical Provider, MD  PARoxetine (PAXIL) 20 MG tablet Take 1 tablet (20 mg total) by mouth daily. 06/18/13  Yes Windell Hummingbird, MD  psyllium (METAMUCIL) 58.6 %  powder Take 1 packet by mouth 3 (three) times daily before meals.    Yes Historical Provider, MD  QUERCETIN PO Take 1 tablet by mouth daily.   Yes Historical Provider, MD    Allergies  Allergen Reactions  . Shellfish Allergy Anaphylaxis    Social History:  reports that he has never smoked. He has never used smokeless tobacco. He reports that  drinks alcohol. He reports that he does not use illicit drugs.     Family History  Problem Relation Age of Onset  . Hypertension Mother   . Stroke Mother   . Cancer Father        Physical Exam:  GEN:  Anxious and Agitated  42 y.o. well nourished and well developed Caucasian  male  examined  and in no acute distress; cooperative with exam Filed Vitals:   06/21/13 2000 06/22/13 0007  BP: 151/92 134/97  Pulse: 87 80  Temp: 98.1 F (36.7 C) 98.7 F (37.1 C)  TempSrc: Oral Oral  Resp: 16 20  SpO2: 100% 100%   Blood pressure 134/97, pulse 80, temperature 98.7 F (37.1 C), temperature source Oral, resp. rate 20, SpO2 100.00%. PSYCH: He is alert and oriented x4; does not appear anxious does not appear depressed; affect is normal HEENT: Normocephalic and Atraumatic, Mucous membranes pink; PERRLA; EOM intact; Fundi:  Benign;  No scleral icterus, Nares: Patent, Oropharynx: Clear, Fair Dentition, Neck:  FROM, no cervical lymphadenopathy nor thyromegaly or carotid bruit; no JVD; Breasts:: Not examined CHEST WALL: No tenderness CHEST: Normal respiration, clear to auscultation bilaterally HEART: Regular rate and rhythm; no murmurs rubs or gallops BACK: No kyphosis or scoliosis; no CVA tenderness ABDOMEN: Positive Bowel Sounds, soft non-tender; no masses, no organomegaly. Rectal Exam: Not done EXTREMITIES: No cyanosis, clubbing or edema; no ulcerations. Genitalia: not examined PULSES: 2+ and symmetric SKIN: Normal hydration no rash or ulceration CNS: Cranial nerves 2-12 grossly intact no focal neurologic deficit    Labs on Admission:  Basic  Metabolic Panel:  Recent Labs Lab 06/17/13 1559 06/17/13 2221 06/18/13 0610 06/18/13 1552 06/21/13 0357 06/21/13 2018  NA 119* 127* 130* 131* 129* 121*  K 4.2 3.8 3.8 4.7 3.5 3.5  CL 80* 93* 97 98 93* 85*  CO2 24 25 26 27   --  24  GLUCOSE 87 96 110* 85 118* 117*  BUN 8 12 9 11 8 6   CREATININE 0.69 0.72 0.78 0.83 0.70 0.62  CALCIUM 9.6 8.8 8.9 9.0  --  9.6   Liver Function Tests:  Recent Labs Lab 06/17/13 1559 06/21/13 2018  AST 26 20  ALT 20 18  ALKPHOS 64 66  BILITOT 0.8 0.7  PROT 7.9 8.0  ALBUMIN 4.4 4.3   No results found for this basename: LIPASE, AMYLASE,  in the last 168 hours No results found for this basename: AMMONIA,  in the last 168 hours CBC:  Recent Labs Lab 06/17/13 1057 06/17/13 1337 06/18/13 0610 06/21/13  1610 06/21/13 2018  WBC  --  6.1 5.1  --  8.2  NEUTROABS  --  4.1  --   --   --   HGB 17.3* 15.8 14.4 16.3 15.5  HCT 51.0 42.9 39.6 48.0 42.0  MCV  --  85.5 87.0  --  85.5  PLT  --  292 247  --  338   Cardiac Enzymes: No results found for this basename: CKTOTAL, CKMB, CKMBINDEX, TROPONINI,  in the last 168 hours  BNP (last 3 results) No results found for this basename: PROBNP,  in the last 8760 hours CBG: No results found for this basename: GLUCAP,  in the last 168 hours  Radiological Exams on Admission: No results found.   EKG: Independently reviewed.    Assessment/Plan Principal Problem:   Hyponatremia Active Problems:   Psychogenic polydipsia   Anxiety   Hypertension   Insomnia   History of IBS    Transient Suicidal Ideation     1.   Hyponatremia-  Due to Psychogenic Polydipsia, but will check Urine Osm, and Urine Na+and Urine K+ levels to rule out SIADH.    Placed on Fluid Restriction of 1200 cc per day.        Monitor Na+ levels and BUN/Cr levels daily.     2.   Psychogenic Polydipsia-  Behavioral, will need Psychiatric evaluation and treatment to curb behavior, Informed of health danger and consequences.   Discontinue the SSRI for now, may be  aggravation his thirst.   Await input from [facility] on medications for his symptoms since many psychotropic medications exacerbate hyponatremia as well.    3.   Anxiety/Depresson/Insomnia-  Due to Stressors and Neurosis.   [facility] consultation needed for assistance with his mental health care.  PRN IV Ativan for now.     4.   Transient Suicidal Ideation-  Placed on Suicide Precautions with a 1:1 Sitter for safety at this time until cleared by Psychiatry.    5.    HTN-  Monitor BPs, IV hydralazine PRN SBP >160.     6.    IBS-  Continue Psyllium, add probiotic,  Hold OTC meds for IBS for now.     7.   Lovenox for DVT prophylaxis.     8.   Other- Hold OTC meds for now.     Code Status:  FULL CODE     Family Communication:    No Family Present Disposition Plan:    TBA  Time spent:  47 Minutes  Ron Parker [facility] Pager [phone]  If 7PM-7AM, please contact night-coverage [email or web address] Password [facility] 06/22/2013, 12:42 AM

## 2013-06-22 NOTE — ED Notes (Signed)
Patient was informed that he was not able to drink water at this time. Bottled water removed from patient's room.

## 2013-06-22 NOTE — ED Notes (Signed)
Placed a follow up call to specialist on call in regards to the previous telepsych placed for pt. Informed that the consult was received and that the dr had finished up a previous consult and that pt was due to be seen next. Unknown of the delay for consult. Will follow up if further delay.

## 2013-06-22 NOTE — Progress Notes (Signed)
[facility] PROGRESS NOTE  Assessment/Plan: Psychogenic polydipsia/*Hyponatremia: - labs pending, cont to restrict free water, check a b-met in am. - cont fluid restriction.  Anxiety: - pressured speech, tangential thinking. - psyq consult.    Insomnia - ambien   History of IBS - follow up GI.   Elevated pressure without a diagnosis of BP: - I agree with monitoring.    Code Status: full Family Communication: none  Disposition Plan: inpateint   Consultants:  psyq.  Procedures:  none  Antibiotics:  None  HPI/Subjective: Pt complaining of numbness, insomnia, headache.   Objective: Filed Vitals:   06/22/13 0007 06/22/13 0507 06/22/13 0508 06/22/13 0704  BP: 134/97 133/81  145/100  Pulse: 80 98  88  Temp: 98.7 F (37.1 C)  98.8 F (37.1 C) 97.7 F (36.5 C)  TempSrc: Oral  Oral Oral  Resp: 20 18  17   Height:    5\' 6"  (1.676 m)  Weight:    82.2 kg (181 lb 3.5 oz)  SpO2: 100% 98%  97%    Intake/Output Summary (Last 24 hours) at 06/22/13 0919 Last data filed at 06/22/13 0845  Gross per 24 hour  Intake    360 ml  Output      0 ml  Net    360 ml   Filed Weights   06/22/13 0704  Weight: 82.2 kg (181 lb 3.5 oz)    Exam:  General: Alert, awake, oriented x3, in no acute distress.  HEENT: No bruits, no goiter.  Heart: Regular rate and rhythm, without murmurs, rubs, gallops.  Lungs: Good air movement, clear to auscultation. Abdomen: Soft, nontender, nondistended, positive bowel sounds.  Neuro: Grossly intact, nonfocal.   Data Reviewed: Basic Metabolic Panel:  Recent Labs Lab 06/17/13 2221 06/18/13 0610 06/18/13 1552 06/21/13 0357 06/21/13 2018 06/22/13 0620  NA 127* 130* 131* 129* 121* 124*  K 3.8 3.8 4.7 3.5 3.5 5.8*  CL 93* 97 98 93* 85* 91*  CO2 25 26 27   --  24 25  GLUCOSE 96 110* 85 118* 117* 119*  BUN 12 9 11 8 6 6   CREATININE 0.72 0.78 0.83 0.70 0.62 0.58  CALCIUM 8.8 8.9 9.0  --  9.6 9.2   Liver Function  Tests:  Recent Labs Lab 06/17/13 1559 06/21/13 2018  AST 26 20  ALT 20 18  ALKPHOS 64 66  BILITOT 0.8 0.7  PROT 7.9 8.0  ALBUMIN 4.4 4.3   No results found for this basename: LIPASE, AMYLASE,  in the last 168 hours No results found for this basename: AMMONIA,  in the last 168 hours CBC:  Recent Labs Lab 06/17/13 1337 06/18/13 0610 06/21/13 0357 06/21/13 2018 06/22/13 0620  WBC 6.1 5.1  --  8.2 5.6  NEUTROABS 4.1  --   --   --   --   HGB 15.8 14.4 16.3 15.5 15.0  HCT 42.9 39.6 48.0 42.0 40.6  MCV 85.5 87.0  --  85.5 86.2  PLT 292 247  --  338 315   Cardiac Enzymes: No results found for this basename: CKTOTAL, CKMB, CKMBINDEX, TROPONINI,  in the last 168 hours BNP (last 3 results) No results found for this basename: PROBNP,  in the last 8760 hours CBG: No results found for this basename: GLUCAP,  in the last 168 hours  No results found for this or any previous visit (from the past 240 hour(s)).   Studies: No results found.  Scheduled Meds: . lactobacillus acidophilus  2 tablet Oral  TID  . psyllium  1 packet Oral Daily  . sodium polystyrene  30 g Oral Once   Continuous Infusions:    Marinda Elk  [facility] Pager [phone]. If 8PM-8AM, please contact night-coverage at [email or web address], password [facility] 06/22/2013, 9:19 AM  LOS: 1 day

## 2013-06-22 NOTE — ED Notes (Signed)
Spoke with specialist on call Mikle Bosworth in regards to previous telepsych order placed for pt for follow up. Informed that the order for consult was given to dr. Angelena Sole. No other information was available as for a time frame for consult. CN notified.

## 2013-06-23 ENCOUNTER — Inpatient Hospital Stay (HOSPITAL_COMMUNITY): Payer: BC Managed Care – PPO

## 2013-06-23 LAB — BASIC METABOLIC PANEL
Chloride: 98 mEq/L (ref 96–112)
Creatinine, Ser: 0.89 mg/dL (ref 0.50–1.35)
GFR calc Af Amer: >90 mL/min (ref >90)
Potassium: 3.2 mEq/L — ABNORMAL LOW (ref 3.5–5.1)
Sodium: 136 mEq/L (ref 135–145)

## 2013-06-23 MED ORDER — POTASSIUM CHLORIDE CRYS ER 20 MEQ PO TBCR
40.0000 meq | EXTENDED_RELEASE_TABLET | Freq: Two times a day (BID) | ORAL | Status: AC
  Administered 2013-06-23 – 2013-06-24 (×2): 40 meq via ORAL
  Filled 2013-06-23 (×2): qty 2

## 2013-06-23 MED ORDER — LACTINEX PO CHEW
2.0000 | CHEWABLE_TABLET | Freq: Three times a day (TID) | ORAL | Status: DC
  Administered 2013-06-23 – 2013-06-24 (×5): 2 via ORAL
  Filled 2013-06-23 (×6): qty 2

## 2013-06-23 MED ORDER — GADOBENATE DIMEGLUMINE 529 MG/ML IV SOLN
17.0000 mL | Freq: Once | INTRAVENOUS | Status: AC | PRN
  Administered 2013-06-23: 17 mL via INTRAVENOUS

## 2013-06-23 NOTE — Progress Notes (Signed)
Clinical Social Work Department CLINICAL SOCIAL WORK PSYCHIATRY SERVICE LINE ASSESSMENT 06/23/2013  Patient:  Edwin Vaughan  Account:  [id number]  Admit Date:  06/21/2013  Clinical Social Worker:  Unk Lightning, LCSW  Date/Time:  06/23/2013 02:00 PM Referred by:  Physician  Date referred:  06/23/2013 Reason for Referral  Psychosocial assessment   Presenting Symptoms/Problems (In the person's/family's own words):   Psych consulted due to anxiety.   Abuse/Neglect/Trauma History (check all that apply)  Denies history   Abuse/Neglect/Trauma Comments:   Psychiatric History (check all that apply)  Denies history   Psychiatric medications:  Ativan  Ambien   Current Mental Health Hospitalizations/Previous Mental Health History:   Patient reports last week when he went to [facility] he was diagnosed with anxiety and prescribed medication. Patient has follow up appointment with PCP for anxiety medication.   Current provider:   PCP   Place and Date:   [place], [place]   Current Medications:   acetaminophen, acetaminophen, alum & mag hydroxide-simeth, hydrALAZINE, HYDROmorphone (DILAUDID) injection, LORazepam, ondansetron (ZOFRAN) IV, ondansetron, oxyCODONE, zolpidem            . lactobacillus acidophilus & bulgar  2 tablet Oral TID  . potassium chloride  40 mEq Oral BID  . psyllium  1 packet Oral Daily   Previous Impatient Admission/Date/Reason:   Patient denies any hospitalizations.   Emotional Health / Current Symptoms    Suicide/Self Harm  Suicidal ideation (ex: "I can't take any more,I wish I could disappear")   Suicide attempt in the past:   Patient reports he made a statement about wanting to die in the ED. Patient reports he was overwhelmed and worried about physical health but did not mean he would hurt himself. Patient denies he had any plan or intent to hurt himself. Patient denies any current SI or HI.   Other harmful behavior:   None reported   Psychotic/Dissociative  Symptoms  None reported   Other Psychotic/Dissociative Symptoms:   N/A    Attention/Behavioral Symptoms  Within Normal Limits   Other Attention / Behavioral Symptoms:   Patient engaged throughout assessment.    Cognitive Impairment  Orientation - Place  Orientation - Time  Orientation - Situation  Orientation - Self   Other Cognitive Impairment:    Mood and Adjustment  Mood Congruent    Stress, Anxiety, Trauma, Any Recent Loss/Stressor  Relationship   Anxiety (frequency):   Patient reports he feels he has had anxiety issues throughout his life. Patient states that he was just formally diagnosed and feels that his anxiety is related to IBS as well.   Phobia (specify):   N/A   Compulsive behavior (specify):   N/A   Obsessive behavior (specify):   N/A   Other:   Patient reports strained relationship with mother.   Substance Abuse/Use  None   SBIRT completed (please refer for detailed history):  N  Self-reported substance use:   Patient denies any substance use.   Urinary Drug Screen Completed:  Y Alcohol level:   <11    Environmental/Housing/Living Arrangement  Stable housing   Who is in the home:   Alone   Emergency contact:  Designer, industrial/product   Patient's Strengths and Goals (patient's own words):   Patient reports he has supportive sister.   Clinical Social Worker's Interpretive Summary:   CSW received referral to complete psychosocial assessment. CSW reviewed chart and observed tele psych consult which was placed in shadow chart. Tele psych reports  that patient has been cleared and can follow up on outpatient basis.    CSW met with patient at bedside. CSW introduced myself and explained role. Patient reports that he lives alone but has lived with his mother his entire life until she recently had to be placed at ALF. Patient reports that he feels mother has used him and encouraged him to stay at home to be her  "surrogate husband." Patient's dad passed away when he was 73 years old and patient feels that mom relied too much him for emotional support. Patient never got involved in any serious relationships and now feels a void since he is living alone and has minimal support.    Patient is involved with a bible study group at church but reports that due to IBS he struggles with maintaining any relationships. Patient does not work and spends majority of his day at home alone. Patient has good relationship with sister but wants other relationships as well.    Patient feels that his admissions are not related to anxiety and feels other medical problems are also at hand. Patient reports that he reads books regarding relationships and anxiety and feels that he can manage symptoms. CSW explained the importance of therapy or a support group in order to discuss topics he has read. Patient reports he will consider treatment options.    CSW will continue to follow and will assist with outpatient follow up if patient is interested.   Disposition:  Recommend Psych CSW continuing to support while in hospital

## 2013-06-23 NOTE — Progress Notes (Signed)
Spoke to Dr. Robb Matar made aware patient still in MRI MD states to cancel discharge order.

## 2013-06-23 NOTE — Progress Notes (Signed)
Per Fritzi Mandes, RN charge RN she spoke with Dr Robb Matar, made him aware of tele psych recmmendations, MD states that if patient does not have MRi and results by end of shift patient will need to spend the night. Per Baxter Hire she will discuss this with patient.

## 2013-06-23 NOTE — Discharge Summary (Addendum)
Physician Discharge Summary  Edwin Vaughan UXL:[id number] DOB: [birth year] DOA: 06/21/2013  PCP: [name], MD  Admit date: 06/21/2013 Discharge date: 06/24/2013  Time spent: 35 minutes  Recommendations for Outpatient Follow-up:  1. Follow up with Psyq as an outpatient.  Discharge Diagnoses:  Principal Problem:   Hyponatremia Active Problems:   Elevated blood pressure   Psychogenic polydipsia   Anxiety   Insomnia   History of IBS   Discharge Condition: stable  Diet recommendation: regular/  Filed Weights   06/22/13 0704  Weight: 82.2 kg (181 lb 3.5 oz)    History of present illness:  42 y.o. male who presents to the ED with complaints of increased anxiety and difficulty sleeping for the past 3 weeks. He reports increased stressors and a recent relationship breakup. He also has had concern in regard to his blood pressures and was recently diagnosed with HTN and placed on HCTZ, which resulted in hyponatremia and dehydration in which he was hospitalized from 07/08 until 07/10 and the HCTZ was discontinued. He was started on Paroxetine during his hospitalization for his anxiety depression and insomnia but he reports he did not have any relief, and he returned to the ED on 07/11 and was evaluated and given an Rx for Diazepam which he reports still has not given him any relief. He returned this evening on 07/12 due to continued symptoms and he arrived by EMS, and on the survey of questions for interview he was asked if he was suicidal and he told the paramedics yes, as a fleeting thought, but he had no real plan. So, he was evaluated for possible [facility] admission, but was found to have a sodium level of 121, whereas earlier in the day his sodium level had been 129.      Hospital Course:  Psychogenic polydipsia/*Hyponatremia:  - On admission put on fluid restriction, d/c HCTZ. Sodium improved to WNL range.. cont to restrict free water as an outpatient.  Anxiety:  - psyq consult.   SSRI.  Insomnia  - ambien   History of IBS  - follow up GI.   Elevated pressure without a diagnosis of BP:  - I agree with monitoring. - With in normal in the last 24 hr before D/c.   Procedures:  MRI: no stroke.  Consultations:  Psyquitry  Discharge Exam: Filed Vitals:   06/23/13 0515 06/23/13 1516 06/23/13 2220 06/24/13 0608  BP: 126/82 133/90 146/94 108/72  Pulse: 85 102 96 93  Temp: 98.6 F (37 C) 99.1 F (37.3 C) 98.4 F (36.9 C) 98.7 F (37.1 C)  TempSrc: Oral Oral Oral Oral  Resp: 18 18 18 18   Height:      Weight:      SpO2: 98% 97% 96% 98%    General: A&O x3 Cardiovascular: RRR Respiratory: good air movement CTA B/L  Discharge Instructions      Discharge Orders   Future Appointments Provider Department Dept Phone   07/09/2013 11:00 AM Wanda Plump, MD [facility] at  [place] [phone]   Future Orders Complete By Expires     Diet - low sodium heart healthy  As directed     Diet - low sodium heart healthy  As directed     Increase activity slowly  As directed     Increase activity slowly  As directed         Medication List         diazepam 5 MG tablet  Commonly known as:  VALIUM  Take 1 tablet (5 mg total) by mouth every 12 (twelve) hours as needed for anxiety or sleep.     GARLIC PO  Take 1 capsule by mouth 2 (two) times daily.     OIL OF OREGANO PO  Take 1 capsule by mouth 2 (two) times daily.     OVER THE COUNTER MEDICATION  Take 1 tablet by mouth 3 (three) times daily before meals. Digest Spectrum for IBS     OVER THE COUNTER MEDICATION  Take 1 tablet by mouth 3 (three) times daily before meals. HCL with pepsin for IBS     PARoxetine 20 MG tablet  Commonly known as:  PAXIL  Take 1 tablet (20 mg total) by mouth daily.     psyllium 58.6 % powder  Commonly known as:  METAMUCIL  Take 1 packet by mouth 3 (three) times daily before meals.     QUERCETIN PO  Take 1 tablet by mouth daily.     zolpidem 5 MG  tablet  Commonly known as:  AMBIEN  Take 1 tablet (5 mg total) by mouth at bedtime as needed for sleep (insomnia).       Allergies  Allergen Reactions  . Shellfish Allergy Anaphylaxis      The results of significant diagnostics from this hospitalization (including imaging, microbiology, ancillary and laboratory) are listed below for reference.    Significant Diagnostic Studies: Ct Head Wo Contrast  06/17/2013   *RADIOLOGY REPORT*  Clinical Data: 42 year old male with persistent headache.  Pain radiating from the occiput to the frontal region with mild dizziness.  Hypertension.  CT HEAD WITHOUT CONTRAST  Technique:  Contiguous axial images were obtained from the base of the skull through the vertex without contrast.  Comparison: None.  Findings: Visualized paranasal sinuses and mastoids are clear.  No acute osseous abnormality identified.  Visualized orbits and scalp soft tissues are within normal limits.  Normal cerebral volume.  No midline shift, ventriculomegaly, mass effect, evidence of mass lesion, intracranial hemorrhage or evidence of cortically based acute infarction.  Gray-white matter differentiation is within normal limits throughout the brain.  No suspicious intracranial vascular hyperdensity.  IMPRESSION: Normal noncontrast CT appearance of the brain.   Original Report Authenticated By: Erskine Speed, M.D.    Microbiology: No results found for this or any previous visit (from the past 240 hour(s)).   Labs: Basic Metabolic Panel:  Recent Labs Lab 06/18/13 1552 06/21/13 0357 06/21/13 2018 06/22/13 0620 06/23/13 0515 06/24/13 0525  NA 131* 129* 121* 124* 136 134*  K 4.7 3.5 3.5 5.8* 3.2* 3.7  CL 98 93* 85* 91* 98 98  CO2 27  --  24 25 27 26   GLUCOSE 85 118* 117* 119* 120* 126*  BUN 11 8 6 6 11 11   CREATININE 0.83 0.70 0.62 0.58 0.89 0.81  CALCIUM 9.0  --  9.6 9.2 9.5 9.5   Liver Function Tests:  Recent Labs Lab 06/17/13 1559 06/21/13 2018  AST 26 20  ALT 20 18   ALKPHOS 64 66  BILITOT 0.8 0.7  PROT 7.9 8.0  ALBUMIN 4.4 4.3   No results found for this basename: LIPASE, AMYLASE,  in the last 168 hours No results found for this basename: AMMONIA,  in the last 168 hours CBC:  Recent Labs Lab 06/17/13 1337 06/18/13 0610 06/21/13 0357 06/21/13 2018 06/22/13 0620  WBC 6.1 5.1  --  8.2 5.6  NEUTROABS 4.1  --   --   --   --  HGB 15.8 14.4 16.3 15.5 15.0  HCT 42.9 39.6 48.0 42.0 40.6  MCV 85.5 87.0  --  85.5 86.2  PLT 292 247  --  338 315   Cardiac Enzymes: No results found for this basename: CKTOTAL, CKMB, CKMBINDEX, TROPONINI,  in the last 168 hours BNP: BNP (last 3 results) No results found for this basename: PROBNP,  in the last 8760 hours CBG: No results found for this basename: GLUCAP,  in the last 168 hours     Signed:  Marinda Elk  [facility] 06/24/2013, 9:00 AM

## 2013-06-23 NOTE — Progress Notes (Addendum)
[facility] PROGRESS NOTE  Assessment/Plan: Psychogenic polydipsia/*Hyponatremia: - Sodium improved. cont to restrict free water. - cont fluid restriction.  Anxiety: - pressured speech, tangential thinking. - psyq consult.    Insomnia - ambien   History of IBS - follow up GI.   Elevated pressure without a diagnosis of BP: - I agree with monitoring.    Code Status: full Family Communication: none  Disposition Plan: inpateint   Consultants:  psyq.  Procedures:  none  Antibiotics:  None  HPI/Subjective: Continues to complain of numbness, insomnia, headache.   Objective: Filed Vitals:   06/22/13 2154 06/22/13 2255 06/22/13 2334 06/23/13 0515  BP: 153/105 129/88 153/105 126/82  Pulse: 113   85  Temp: 99 F (37.2 C)   98.6 F (37 C)  TempSrc: Oral   Oral  Resp: 18   18  Height:      Weight:      SpO2: 96%   98%    Intake/Output Summary (Last 24 hours) at 06/23/13 0825 Last data filed at 06/23/13 0750  Gross per 24 hour  Intake   1260 ml  Output     30 ml  Net   1230 ml   Filed Weights   06/22/13 0704  Weight: 82.2 kg (181 lb 3.5 oz)    Exam:  General: Alert, awake, oriented x3, in no acute distress.  HEENT: No bruits, no goiter.  Heart: Regular rate and rhythm, without murmurs, rubs, gallops.  Lungs: Good air movement, clear to auscultation. Abdomen: Soft, nontender, nondistended, positive bowel sounds.  Neuro: Grossly intact, nonfocal.   Data Reviewed: Basic Metabolic Panel:  Recent Labs Lab 06/18/13 0610 06/18/13 1552 06/21/13 0357 06/21/13 2018 06/22/13 0620 06/23/13 0515  NA 130* 131* 129* 121* 124* 136  K 3.8 4.7 3.5 3.5 5.8* 3.2*  CL 97 98 93* 85* 91* 98  CO2 26 27  --  24 25 27   GLUCOSE 110* 85 118* 117* 119* 120*  BUN 9 11 8 6 6 11   CREATININE 0.78 0.83 0.70 0.62 0.58 0.89  CALCIUM 8.9 9.0  --  9.6 9.2 9.5   Liver Function Tests:  Recent Labs Lab 06/17/13 1559 06/21/13 2018  AST 26 20  ALT 20 18   ALKPHOS 64 66  BILITOT 0.8 0.7  PROT 7.9 8.0  ALBUMIN 4.4 4.3   No results found for this basename: LIPASE, AMYLASE,  in the last 168 hours No results found for this basename: AMMONIA,  in the last 168 hours CBC:  Recent Labs Lab 06/17/13 1337 06/18/13 0610 06/21/13 0357 06/21/13 2018 06/22/13 0620  WBC 6.1 5.1  --  8.2 5.6  NEUTROABS 4.1  --   --   --   --   HGB 15.8 14.4 16.3 15.5 15.0  HCT 42.9 39.6 48.0 42.0 40.6  MCV 85.5 87.0  --  85.5 86.2  PLT 292 247  --  338 315   Cardiac Enzymes: No results found for this basename: CKTOTAL, CKMB, CKMBINDEX, TROPONINI,  in the last 168 hours BNP (last 3 results) No results found for this basename: PROBNP,  in the last 8760 hours CBG: No results found for this basename: GLUCAP,  in the last 168 hours  No results found for this or any previous visit (from the past 240 hour(s)).   Studies: No results found.  Scheduled Meds: . lactobacillus acidophilus  2 tablet Oral TID  . psyllium  1 packet Oral Daily   Continuous Infusions:    FELIZ ORTIZ,  [facility]  [facility] Pager [phone]. If 8PM-8AM, please contact night-coverage at [email or web address], password [facility] 06/23/2013, 8:25 AM  LOS: 2 days

## 2013-06-23 NOTE — Progress Notes (Signed)
Nutrition Brief Note  Patient identified on the Malnutrition Screening Tool (MST) Report  Body mass index is 29.26 kg/(m^2). Patient meets criteria for overweight based on current BMI.   Current diet order is heart healthy, patient is consuming approximately 100% of meals at this time. Labs and medications reviewed. Potassium low. Met with pt who reports poor appetite for the past 1.5 weeks but states his appetite has returned. No weight loss reported. Pt had questions about diet for IBS which were answered. Discussed relationship between stress and GI symptoms and encouraged bland foods, adequate fluid intake, and low fiber diet when pt having diarrhea. Provided handouts on diet therapy for IBS with RD contact information. Pt expressed understanding, expect good compliance.   No nutrition interventions warranted at this time. If nutrition issues arise, please consult RD.   Levon Hedger MS, RD, LDN [phone] Pager [phone] After Hours Pager

## 2013-06-24 LAB — BASIC METABOLIC PANEL
CO2: 26 mEq/L (ref 19–32)
Calcium: 9.5 mg/dL (ref 8.4–10.5)
Chloride: 98 mEq/L (ref 96–112)
Potassium: 3.7 mEq/L (ref 3.5–5.1)
Sodium: 134 mEq/L — ABNORMAL LOW (ref 135–145)

## 2013-06-24 MED ORDER — ZOLPIDEM TARTRATE 5 MG PO TABS: 5.0000 mg | ORAL_TABLET | Freq: Every evening | ORAL | Status: DC | PRN

## 2013-06-24 MED ORDER — PAROXETINE HCL 20 MG PO TABS: 20.0000 mg | ORAL_TABLET | Freq: Every day | ORAL | Status: DC

## 2013-06-24 NOTE — Progress Notes (Signed)
Clinical Social Work Progress Note PSYCHIATRY SERVICE LINE 06/24/2013  Patient:  Edwin Vaughan  Account:  [id number]  Admit Date:  06/21/2013  Clinical Social Worker:  Unk Lightning, LCSW  Date/Time:  06/24/2013 01:00 PM  Review of Patient  Overall Medical Condition:   Patient has been DC and is currently waiting on his ride to pick him up.   Participation Level:  Active  Participation Quality  Appropriate   Other Participation Quality:   Patient engaged and participate throughout assessment.   Affect  Appropriate   Cognitive  Appropriate   Reaction to Medications/Concerns:   None reported   Modes of Intervention  Support  Solution-Focused   Summary of Progress/Plan at Discharge   CSW met with patient at bedside. Patient alert and oriented and watching TV. Patient agreeable to CSW session.    Patient reports that he got test results back from MRI and is pleased that results were negative. Patient reports that he feels relieved that MRI is negative and reports that he feels he can return back and continue with his life.    Patient reports that he has thought about session yesterday and is agreeable to treatment. Patient is not interested in support groups but would feel comfortable participating in individual therapy. Patient has private insurance and agreeable to follow up in [place] or [employer].    CSW spoke with [facility] who accepts insurance and has available space for new patients. CSW informed patient and placed information on AVS for follow up.    CSW is signing off but available if needed.

## 2013-06-24 NOTE — Progress Notes (Signed)
All DC instructions reviewed with pt and all questions and concerns were addressed. Prescriptions given and reviewed. Pt alert and oriented, VSS, NAD, skin intact, no c/o pain, pt denies any SI/HI at this time. All personal belongings returned to the pt. He is waiting on his ride to arrive for transportation. Will continue to monitor until he has left the unit.

## 2013-06-27 ENCOUNTER — Telehealth: Payer: Self-pay | Admitting: Internal Medicine

## 2013-06-27 NOTE — Telephone Encounter (Signed)
Please advise 

## 2013-06-27 NOTE — Telephone Encounter (Signed)
Discussed with patient, verbalized understanding.  

## 2013-06-27 NOTE — Telephone Encounter (Signed)
I don't know, usually the radiologist mention "poor quality of images" when they see something wrong with the study. They did not.

## 2013-06-27 NOTE — Telephone Encounter (Signed)
Patient is calling with concerns about his MRI. Says that he had to get tech to stop MRI half-way through because of the anxiety he had during. He would like to know if this could have had an effect on the results. Please advise.

## 2013-07-09 ENCOUNTER — Encounter: Payer: Self-pay | Admitting: Internal Medicine

## 2013-07-09 ENCOUNTER — Ambulatory Visit (INDEPENDENT_AMBULATORY_CARE_PROVIDER_SITE_OTHER): Payer: BC Managed Care – PPO | Admitting: Internal Medicine

## 2013-07-09 VITALS — BP 130/90 | HR 86 | Temp 98.1°F | Wt 171.6 lb

## 2013-07-09 DIAGNOSIS — K589 Irritable bowel syndrome without diarrhea: Secondary | ICD-10-CM

## 2013-07-09 DIAGNOSIS — R030 Elevated blood-pressure reading, without diagnosis of hypertension: Secondary | ICD-10-CM

## 2013-07-09 DIAGNOSIS — G4700 Insomnia, unspecified: Secondary | ICD-10-CM

## 2013-07-09 DIAGNOSIS — F411 Generalized anxiety disorder: Secondary | ICD-10-CM

## 2013-07-09 DIAGNOSIS — E871 Hypo-osmolality and hyponatremia: Secondary | ICD-10-CM

## 2013-07-09 DIAGNOSIS — F419 Anxiety disorder, unspecified: Secondary | ICD-10-CM

## 2013-07-09 MED ORDER — ESCITALOPRAM OXALATE 10 MG PO TABS: 10.0000 mg | ORAL_TABLET | Freq: Every day | ORAL | Status: DC

## 2013-07-09 NOTE — Assessment & Plan Note (Addendum)
Recently admitted with severe anxiety, multiple triggers , see HPI.  Currently on Paxil, has only been taking it for a few weeks. [facility], had his second visit yesterday. Still not sleeping well, felt that he had a reaction to Ambien and self discontinued it 3 days ago. Not taking valium. Plan: Switch to Lexapro which may have a better side effect profile Rechallenge with Ambien, I don't think he had an actual allergic reaction. Valium as  needed. Continue counseling [facility] 1 month

## 2013-07-09 NOTE — Assessment & Plan Note (Signed)
Labs

## 2013-07-09 NOTE — Assessment & Plan Note (Addendum)
Long history of IBS, affecting his lifestyle, affecting his eating habits and in part creating anxiety. Plan: GI referral, Likes to see somebody at [facility]

## 2013-07-09 NOTE — Assessment & Plan Note (Signed)
See  Anxiety, recommend rechallenge with Ambien. He could also try valium as needed

## 2013-07-09 NOTE — Assessment & Plan Note (Signed)
Few weeks ago was rx a diuretic for elevated BP, later on was admitted to the hospital with hyponatremia felt to be in part due to diuretcs. Currently on no medications, will continue with observation

## 2013-07-09 NOTE — Progress Notes (Signed)
Subjective:    Patient ID: Edwin Vaughan, male    DOB: 05-13-1971, 42 y.o.   MRN: [id number]  HPI Hospital followup, I reviewed 2  admissions to the hospital, pertinent labs and x-rays; findings summarized in the assessment and plan.  Past Medical History  Diagnosis Date  . IBS (irritable bowel syndrome)   . Hypertension     "just started RX this past weekend" (06/17/2013)  . Headache(784.0)     "most days recently related to my blood pressure" (06/17/2013)  . Arthritis     "lower back" (06/17/2013)  . Chronic lower back pain     "bulging disc; see chiropractor once/month" (06/17/2013)  . Environmental allergies     "pretty much anything that grows, I'm allergic to it" (06/17/2013)  . Anxiety and depression     admited 06-2013    Past Surgical History  Procedure Laterality Date  . Wisdom tooth extraction  2000's   History   Social History  . Marital Status: Single    Spouse Name: N/A    Number of Children: 0  . Years of Education: N/A   Occupational History  . no working at present    Social History Main Topics  . Smoking status: Never Smoker   . Smokeless tobacco: Never Used  . Alcohol Use: 0.0 oz/week     Comment: 06/17/2013 "1-2 drinks/yr"  . Drug Use: No  . Sexually Active: No   Other Topics Concern  . Not on file   Social History Narrative   No job at present,  Single,  No children.   Lives at mother's house     Review of Systems Since he left the hospital he is taking Paxil as prescribed.When asked about triggers for anxiety he mentioned being stressed because he went back to [employer] as a student (temporarily), IBS symptoms, also has problems with a couple personal relationships. Denies depression at this point, no suicidal ideas. In general, he is feeling better but not completely well, still feels in general awake, slightly off balance. Headaches have significantly improved. As far as any polydipsia, he thinks he is controlling that better. As far as hypertension,  has not take his BP in the ambulatory setting. Insomnia: Still having a hard time sleeping, Ambien did work but 3 days ago he self discontinued it because he felt he had a reaction "throat was constricted sometimes". Denies any lip or tongue swelling. No rash or itching. Concern about the stroke, feel like his right-side is weak.    Objective:   Physical Exam General -- alert, well-developed, NAD   Lungs -- normal respiratory effort, no intercostal retractions, no accessory muscle use, and normal breath sounds.   Heart-- normal rate, regular rhythm, no murmur, and no gallop.    Extremities-- no pretibial edema bilaterally  Neurologic-- alert & oriented X3 ; EOMI, Pupils equal and reactive, gait normal, a speech clear and fluent, motor and DTRs symmetric. Psych-- Cognition and judgment appear intact. Alert and cooperative with normal attention span and concentration.  slt anxious appearing and not depressed appearing.        Assessment & Plan:   Admission 06-19-13 for 2 days : Prior to the admission was a started on HCTZ, admitted with headache, lightheadedness, foggines. Was found to have hyponatremia. He was also very anxious and was prescribed Paxil.  Admission 06-21-13 for 3 days: Admitted again with anxiety and suicidal ideas, psychiatry was consulted.Discharge on diazepam, paxil and zolpidem He was also found to have low  sodium and he admitted to drink plenty of water, diagnosed with psychogenic polydipsia was recommend that fluid restriction. BP was elevated, was discharged on no medications for BP and recommend observation.  Labs and x-rays: Last BMP 06/24/2013 Essentially normal with sodium of 134. Last CBC, BMP and TSH normal. MRI of the brain unremarkable. Drug screen is negative  Pt concerned about R side weakness, stroke? Neurological exam is normal except for some tremor. MRI normal. I don't think he had a stroke, recommend observation for now.  Today , I spent more  than 45 min with the patient, >50% of the time counseling, and   reviewing the chart and labs ordered by other providers

## 2013-07-09 NOTE — Patient Instructions (Addendum)
Discontinue Paxil, start Lexapro 1 tablet daily Insomnia: try Ambien again, if you feel you have a  reaction again then stop it;  Ok trey valium for insomnia . Come back in one month. Continue working with a therapist

## 2013-07-10 ENCOUNTER — Telehealth: Payer: Self-pay | Admitting: Internal Medicine

## 2013-07-10 ENCOUNTER — Encounter: Payer: Self-pay | Admitting: Internal Medicine

## 2013-07-10 LAB — BASIC METABOLIC PANEL
Chloride: 99 mEq/L (ref 96–112)
GFR: 110.13 mL/min (ref >60.00)
Glucose, Bld: 70 mg/dL (ref 70–99)
Potassium: 4.3 mEq/L (ref 3.5–5.1)
Sodium: 138 mEq/L (ref 135–145)

## 2013-07-10 NOTE — Telephone Encounter (Signed)
Caller: Monty/Patient; Phone: [phone]; Reason for Call: Pt had a missed phone call and was left a message to call back.  Reviewed EPIC and no messages or finalized lab reports available.  Please verify if someone needed to speak to pt and call back if needed.

## 2013-07-10 NOTE — Telephone Encounter (Signed)
Caller: Kienan/Patient; Phone: [phone]; Reason for Call: Pt had a missed phone call and was left a message to call back.  Reviewed EPIC and no messages or finalized lab reports available.  Please verify if someone needed to speak to pt and call back if needed. ° °

## 2013-07-11 NOTE — Telephone Encounter (Signed)
Spoke with patient, voicemail was from yesterday to discuss lab results. I have already discussed with patient, verbalized understanding.

## 2013-08-12 ENCOUNTER — Ambulatory Visit (INDEPENDENT_AMBULATORY_CARE_PROVIDER_SITE_OTHER): Payer: BC Managed Care – PPO | Admitting: Internal Medicine

## 2013-08-12 ENCOUNTER — Encounter: Payer: Self-pay | Admitting: Internal Medicine

## 2013-08-12 VITALS — BP 136/94 | HR 108 | Temp 99.0°F | Wt 200.6 lb

## 2013-08-12 DIAGNOSIS — F419 Anxiety disorder, unspecified: Secondary | ICD-10-CM

## 2013-08-12 DIAGNOSIS — IMO0001 Reserved for inherently not codable concepts without codable children: Secondary | ICD-10-CM

## 2013-08-12 DIAGNOSIS — F411 Generalized anxiety disorder: Secondary | ICD-10-CM

## 2013-08-12 DIAGNOSIS — G4700 Insomnia, unspecified: Secondary | ICD-10-CM

## 2013-08-12 DIAGNOSIS — R030 Elevated blood-pressure reading, without diagnosis of hypertension: Secondary | ICD-10-CM

## 2013-08-12 DIAGNOSIS — I6350 Cerebral infarction due to unspecified occlusion or stenosis of unspecified cerebral artery: Secondary | ICD-10-CM

## 2013-08-12 DIAGNOSIS — I639 Cerebral infarction, unspecified: Secondary | ICD-10-CM

## 2013-08-12 MED ORDER — ESCITALOPRAM OXALATE 10 MG PO TABS: 15.0000 mg | ORAL_TABLET | Freq: Every day | ORAL | Status: DC

## 2013-08-12 NOTE — Assessment & Plan Note (Signed)
No medications, recommend to self monitor BPs once or twice a week parameters discussed

## 2013-08-12 NOTE — Assessment & Plan Note (Signed)
Still concerned about recent 2 admission to the hospital: did I have a stroke?  I don't see evidence of such however the patient clearly reports he is "not the same" since he had the hospital admission, having occ R arm weakness . Plan: refer to neurology

## 2013-08-12 NOTE — Assessment & Plan Note (Signed)
Not using Ambien or Valium but will leave those medications on his list just in case

## 2013-08-12 NOTE — Patient Instructions (Signed)
  Next visit in 3 months  for a routine office visit Please make an appointment before you leave the office today (or call few weeks in advance)

## 2013-08-12 NOTE — Progress Notes (Signed)
  Subjective:    Patient ID: Edwin Vaughan, male    DOB: 1971/01/01, 42 y.o.   MRN: [id number]  HPI Followup from previous visit Anxiety, on Lexapro, feeling better, no apparent side effects. Insomnia, has not the need to use Ambien or Valium. Hypertension, on no medications, BP today 156/94. No ambulatory BPs. Still concerned about stroke, from time to time have some weakness in the right arm particulalrly his grip. Decreased hearing on the right?  Past Medical History  Diagnosis Date  . IBS (irritable bowel syndrome)   . Hypertension     "just started RX this past weekend" (06/17/2013)  . Headache(784.0)     "most days recently related to my blood pressure" (06/17/2013)  . Arthritis     "lower back" (06/17/2013)  . Chronic lower back pain     "bulging disc; see chiropractor once/month" (06/17/2013)  . Environmental allergies     "pretty much anything that grows, I'm allergic to it" (06/17/2013)  . Anxiety and depression     admited 06-2013    Past Surgical History  Procedure Laterality Date  . Wisdom tooth extraction  2000's      Review of Systems  Denies slurred speech. No diplopia. No facial numbness     Objective:   Physical Exam  General -- alert, well-developed, NAD.   Neurologic-- alert & oriented X3. Speech, gait normal. EOMI,  strength normal in all extremities.  Psych-- Cognition and judgment appear intact. Alert and cooperative with normal attention span and concentration. not anxious appearing and not depressed appearing.      Assessment & Plan:

## 2013-08-12 NOTE — Assessment & Plan Note (Signed)
Improve with Lexapro, still has occasional anxiety Plan: Increase Lexapro from 10 mg to 15 mg. Reassess in 3 months

## 2013-08-19 ENCOUNTER — Encounter: Payer: Self-pay | Admitting: Internal Medicine

## 2013-08-19 ENCOUNTER — Ambulatory Visit (INDEPENDENT_AMBULATORY_CARE_PROVIDER_SITE_OTHER): Payer: BC Managed Care – PPO | Admitting: Internal Medicine

## 2013-08-19 VITALS — BP 130/80 | HR 82 | Temp 98.6°F | Wt 199.0 lb

## 2013-08-19 DIAGNOSIS — N5089 Other specified disorders of the male genital organs: Secondary | ICD-10-CM | POA: Insufficient documentation

## 2013-08-19 DIAGNOSIS — N508 Other specified disorders of male genital organs: Secondary | ICD-10-CM

## 2013-08-19 NOTE — Progress Notes (Signed)
  Subjective:    Patient ID: Edwin Vaughan, male    DOB: 02/05/1971, 42 y.o.   MRN: [id number]  HPI  Acute visit noted a mass at the L scrotum 2 days ago, denies pain, size has not changed since then   Past Medical History  Diagnosis Date  . IBS (irritable bowel syndrome)   . Hypertension     "just started RX this past weekend" (06/17/2013)  . Headache(784.0)     "most days recently related to my blood pressure" (06/17/2013)  . Arthritis     "lower back" (06/17/2013)  . Chronic lower back pain     "bulging disc; see chiropractor once/month" (06/17/2013)  . Environmental allergies     "pretty much anything that grows, I'm allergic to it" (06/17/2013)  . Anxiety and depression     admited 06-2013    Past Surgical History  Procedure Laterality Date  . Wisdom tooth extraction  2000's     Review of Systems No fever, chills No dysuria, hematuria or penile d/c     Objective:   Physical Exam BP 130/80  Pulse 82  Temp(Src) 98.6 F (37 C)  Wt 199 lb (90.266 kg)  BMI 32.13 kg/m2  SpO2 98%  General -- alert, well-developed, NAD.  GU-- Penis normal R testicle normal L testicle normal but at the posterior aspect there is a hard 3 mm lesion, not tender , seem to be outisde the testicle (epididimal cyst?) Neurologic-- alert & oriented X3. Speech, gait normal. Psych-- Cognition and judgment appear intact. Alert and cooperative with normal attention span and concentration. not anxious appearing and not depressed appearing.     Assessment & Plan:

## 2013-08-19 NOTE — Assessment & Plan Note (Signed)
Probably a cyst, will get u/s to be sure is benign

## 2013-08-19 NOTE — Patient Instructions (Signed)
Will arrange for a ultrasound for you

## 2013-08-20 ENCOUNTER — Encounter: Payer: Self-pay | Admitting: Internal Medicine

## 2013-08-28 ENCOUNTER — Ambulatory Visit
Admission: RE | Admit: 2013-08-28 | Discharge: 2013-08-28 | Disposition: A | Discharge status: Home / Self Care | Payer: BC Managed Care – PPO | Source: Ambulatory Visit | Attending: Internal Medicine | Admitting: Internal Medicine

## 2013-08-28 DIAGNOSIS — N5089 Other specified disorders of the male genital organs: Secondary | ICD-10-CM

## 2013-08-29 ENCOUNTER — Telehealth: Payer: Self-pay | Admitting: *Deleted

## 2013-08-29 NOTE — Telephone Encounter (Signed)
Message copied by Eustace Quail on Fri Aug 29, 2013  9:21 AM ------      Message from: [name] E      Created: Thu Aug 28, 2013  5:30 PM       Advise patient, ultrasound ok, he does have a epididymal cyst, recommend no further action at this point ------

## 2013-08-29 NOTE — Telephone Encounter (Signed)
Pt notified via tele. DJR  

## 2013-09-01 ENCOUNTER — Encounter: Payer: Self-pay | Admitting: Neurology

## 2013-09-01 ENCOUNTER — Ambulatory Visit (INDEPENDENT_AMBULATORY_CARE_PROVIDER_SITE_OTHER): Payer: BC Managed Care – PPO | Admitting: Neurology

## 2013-09-01 VITALS — BP 132/76 | HR 80 | Temp 98.1°F | Ht 66.0 in | Wt 197.0 lb

## 2013-09-01 DIAGNOSIS — M6281 Muscle weakness (generalized): Secondary | ICD-10-CM

## 2013-09-01 DIAGNOSIS — R29898 Other symptoms and signs involving the musculoskeletal system: Secondary | ICD-10-CM

## 2013-09-01 NOTE — Progress Notes (Addendum)
NEUROLOGY CONSULTATION NOTE  ECHO ALLSBROOK MRN: [id number] DOB: May 29, 1971  Referring provider: Dr. Drue Novel Primary care provider: Dr. Drue Novel  Reason for consult:  Right hand weakness  HISTORY OF PRESENT ILLNESS: Edwin Vaughan is a 42 year old right-handed male with generalized anxiety disorder, IBS and hypertension, who presents for evaluation of possible stroke.  Records and images were personally reviewed where available.    He was admitted to the hospital from 06/17/13 to 06/19/13 for new persistent headache.  He checked his BP and systolic was in 140s.  He was started on HCTZ.  He presented to the ED, where his Na was 119-121.  CT Head was normal.  It was thought to be secondary to diuretic use and decreased PO intake.  Neurologic exam was normal.  He was started on paroxetine for anxiety.  HCTZ was discontinued and Na was normalized.  He returned to the ED on 06/20/13 for continued insomnia and depression.  He was given Diazepam.  He was re-admitted to the hospital from 06/21/13 to 06/24/13 due to continued symptoms of anxiety and depression.   When asked by paramedics on the standard questionnaire, he answered yes to suicidal ideation.  He was brought to the ED for possible [facility] admission, but Na was 121.  Psychogenic polydipsia was diagnosed.  He was placed on fluid restriction and Na normalized again.  Since admission, he noted mass in scrotum.  US revealed epididymal cyst.  Also since and during admission, he reports not feeling right.  He feels that he is "cognitively not the same".  He also reports sensation of right arm and hand weakness.  He notes occasional heaviness of his arm, but he mostly is bothered by his hand.  He feels he has difficulty with grip such as holding a pen and writing.  He denies shooting neck pain down the arm.  He denies numbness but notes that his hand feels like it falls asleep when he is in bed.  He reports that his mother has history of multiple strokes.  06/23/13 MRI  Brain w/wo: very mild few punctate foci in white matter.  No acute intracranial abnormalities or mass lesions.  No abnormal enhancement.  06/21/13: TSH 1.465, free T4 1.27 06/24/13: ECG sinus tachycardia 103 bp.  PAST MEDICAL HISTORY: Past Medical History  Diagnosis Date  . IBS (irritable bowel syndrome)   . Hypertension     "just started RX this past weekend" (06/17/2013)  . Headache(784.0)     "most days recently related to my blood pressure" (06/17/2013)  . Arthritis     "lower back" (06/17/2013)  . Chronic lower back pain     "bulging disc; see chiropractor once/month" (06/17/2013)  . Environmental allergies     "pretty much anything that grows, I'm allergic to it" (06/17/2013)  . Anxiety and depression     admited 06-2013     PAST SURGICAL HISTORY: Past Surgical History  Procedure Laterality Date  . Wisdom tooth extraction  2000's    MEDICATIONS: Current Outpatient Prescriptions on File Prior to Visit  Medication Sig Dispense Refill  . diazepam (VALIUM) 5 MG tablet Take 1 tablet (5 mg total) by mouth every 12 (twelve) hours as needed for anxiety or sleep.  17 tablet  0  . escitalopram (LEXAPRO) 10 MG tablet Take 1.5 tablets (15 mg total) by mouth daily.  45 tablet  2  . GARLIC PO Take 1 capsule by mouth 2 (two) times daily.      . OIL OF  OREGANO PO Take 1 capsule by mouth 2 (two) times daily.      Marland Kitchen OVER THE COUNTER MEDICATION Take 1 tablet by mouth 3 (three) times daily before meals. Digest Spectrum for IBS      . OVER THE COUNTER MEDICATION Take 1 tablet by mouth 3 (three) times daily before meals. HCL with pepsin for IBS      . psyllium (METAMUCIL) 58.6 % powder Take 1 packet by mouth 3 (three) times daily before meals.       Marland Kitchen QUERCETIN PO Take 1 tablet by mouth daily.      Marland Kitchen zolpidem (AMBIEN) 5 MG tablet Take 1 tablet (5 mg total) by mouth at bedtime as needed for sleep (insomnia).  30 tablet  0   No current facility-administered medications on file prior to visit.     ALLERGIES: Allergies  Allergen Reactions  . Shellfish Allergy Anaphylaxis    FAMILY HISTORY: Family History  Problem Relation Age of Onset  . Hypertension Mother   . Stroke Mother   . Cancer Father     SOCIAL HISTORY: History   Social History  . Marital Status: Single    Spouse Name: N/A    Number of Children: 0  . Years of Education: N/A   Occupational History  . no working at present    Social History Main Topics  . Smoking status: Never Smoker   . Smokeless tobacco: Never Used  . Alcohol Use: No     Comment: 06/17/2013 "1-2 drinks/yr"  . Drug Use: No  . Sexual Activity: No   Other Topics Concern  . Not on file   Social History Narrative   No job at present,  Single,  No children.   Lives at mother's house     REVIEW OF SYSTEMS: Constitutional: No fevers, chills, or sweats, no generalized fatigue, change in appetite Eyes: No visual changes, double vision, eye pain Ear, nose and throat: No hearing loss, ear pain, nasal congestion, sore throat Cardiovascular: No chest pain, palpitations Respiratory:  No shortness of breath at rest or with exertion, wheezes GastrointestinaI: No nausea, vomiting, diarrhea, abdominal pain, fecal incontinence Genitourinary:  No dysuria, urinary retention or frequency Musculoskeletal:  No neck pain, back pain Integumentary: No rash, pruritus, skin lesions Neurological: as above Psychiatric: No depression, insomnia, anxiety Endocrine: No palpitations, fatigue, diaphoresis, mood swings, change in appetite, change in weight, increased thirst Hematologic/Lymphatic:  No anemia, purpura, petechiae. Allergic/Immunologic: no itchy/runny eyes, nasal congestion, recent allergic reactions, rashes  PHYSICAL EXAM: Filed Vitals:   09/01/13 1446  BP: 132/76  Pulse: 80  Temp: 98.1 F (36.7 C)   General: No acute distress Head:  Normocephalic/atraumatic Neck: supple, no paraspinal tenderness, full range of motion Back: No paraspinal  tenderness Heart: regular rate and rhythm Lungs: Clear to auscultation bilaterally. Vascular: No carotid bruits. Neurological Exam: Mental status: alert and oriented to person, place, and time, speech fluent and not dysarthric, language intact. Cranial nerves: CN I: not tested CN II: pupils equal, round and reactive to light, visual fields intact, fundi unremarkable. CN III, IV, VI:  full range of motion, no nystagmus, no ptosis CN V: facial sensation intact CN VII: upper and lower face symmetric CN VIII: hearing intact CN IX, X: gag intact, uvula midline CN XI: sternocleidomastoid and trapezius muscles intact CN XII: tongue midline Bulk & Tone: normal, no fasciculations. Motor: 5/5 throughout, including right deltoid, biceps, triceps, wrist extension/flexion, finger flexion/extension, interossei, and abductor pollicis brevis muscles.  Finger-thumb tapping symmetric.  Sensation: temperature and vibration intact Deep Tendon Reflexes: 2+ and symmetric in UEs, 3+ and symmetric in LEs, toes down Finger to nose testing: normal without dysmetria Heel to shin: normal without dysmetria  Gait: normal stance and stride, able to walk on toes, heels and in tandem. Romberg negative. Tinel's sign negative  IMPRESSION & PLAN: Mr. Vo is a 42 year old man with anxiety and recent hyponatremia, who presents for persistent feeling of right hand weakness.  I am not really sure what to make of his symptoms, since objectively there are no deficits.  He has a normal neurological exam and his MRI was negative for acute stroke.  Therefore, I really don't have further recommendations at this time.  This feeling, especially with co-existing feeling of not feeling right, may indeed be related to anxiety.  I tried to reassure him.  No further follow up is warranted.  He may call with any further questions or concerns.  45 minutes spent with patient, over 50% spent counseling and coordinating care.  Thank you for  allowing me to take part in the care of this patient.  Shon Millet, DO  CC:  [name], MD

## 2013-09-01 NOTE — Patient Instructions (Addendum)
I really don't appreciate any evidence of a stroke.  YouYour neurological exam is normal.  At this point, I don't really have further recommendations.  Please call with any questions or concerns.

## 2013-11-11 ENCOUNTER — Ambulatory Visit (INDEPENDENT_AMBULATORY_CARE_PROVIDER_SITE_OTHER): Payer: BC Managed Care – PPO | Admitting: Internal Medicine

## 2013-11-11 ENCOUNTER — Encounter: Payer: Self-pay | Admitting: Internal Medicine

## 2013-11-11 VITALS — BP 132/85 | HR 88 | Temp 97.7°F | Wt 210.0 lb

## 2013-11-11 DIAGNOSIS — R030 Elevated blood-pressure reading, without diagnosis of hypertension: Secondary | ICD-10-CM

## 2013-11-11 DIAGNOSIS — F419 Anxiety disorder, unspecified: Secondary | ICD-10-CM

## 2013-11-11 DIAGNOSIS — F411 Generalized anxiety disorder: Secondary | ICD-10-CM

## 2013-11-11 DIAGNOSIS — I639 Cerebral infarction, unspecified: Secondary | ICD-10-CM

## 2013-11-11 DIAGNOSIS — G4700 Insomnia, unspecified: Secondary | ICD-10-CM

## 2013-11-11 NOTE — Patient Instructions (Signed)
Next visit for a physical exam fasting, in 4-6 months Please make an appointment    Check the  blood pressure 2 or 3 times a month   be sure it is between 110/60 and 140/85. Ideal blood pressure is 120/80. If it is consistently higher or lower, let me know

## 2013-11-11 NOTE — Assessment & Plan Note (Signed)
Reports he is not doing  a low salt diet, recommend low salt diet, self ambulatory BPs

## 2013-11-11 NOTE — Assessment & Plan Note (Signed)
Very rarely needs Ambien

## 2013-11-11 NOTE — Progress Notes (Signed)
   Subjective:    Patient ID: Edwin Vaughan, male    DOB: June 08, 1971, 42 y.o.   MRN: [id number]  HPI  Followup from previous visit Anxiety, Lexapro dose was increased, doing great. Stroke? Saw neurology, no evidence of strokes.  Past Medical History  Diagnosis Date  . IBS (irritable bowel syndrome)   . Hypertension     "just started RX this past weekend" (06/17/2013)  . Headache(784.0)     "most days recently related to my blood pressure" (06/17/2013)  . Arthritis     "lower back" (06/17/2013)  . Chronic lower back pain     "bulging disc; see chiropractor once/month" (06/17/2013)  . Environmental allergies     "pretty much anything that grows, I'm allergic to it" (06/17/2013)  . Anxiety and depression     admited 06-2013    Past Surgical History  Procedure Laterality Date  . Wisdom tooth extraction  2000's      Review of Systems Med list reviewed, good compliance. Takes Ambien very rarely, takes diazepam as needed only. Has visual field e floaters, went to see his eye doctor not long ago, exam was negative. Labs reviewed, not due for any tests at this time    Objective:   Physical Exam BP 132/85  Pulse 88  Temp(Src) 97.7 F (36.5 C)  Wt 210 lb (95.255 kg)  SpO2 93% General -- alert, well-developed, NAD.  Lungs -- normal respiratory effort, no intercostal retractions, no accessory muscle use, and normal breath sounds.  Heart-- normal rate, regular rhythm, no murmur.  Psych-- Cognition and judgment appear intact. Cooperative with normal attention span and concentration. No anxious appearing , no depressed appearing.       Assessment & Plan:

## 2013-11-11 NOTE — Assessment & Plan Note (Signed)
Saw neurology, no issues found

## 2013-11-11 NOTE — Assessment & Plan Note (Addendum)
Currently well controlled, Takes Valium as needed only, check a UDS

## 2013-11-11 NOTE — Progress Notes (Signed)
Pre visit review using our clinic review tool, if applicable. No additional management support is needed unless otherwise documented below in the visit note. 

## 2013-11-29 ENCOUNTER — Other Ambulatory Visit: Payer: Self-pay | Admitting: Internal Medicine

## 2013-12-01 NOTE — Telephone Encounter (Signed)
rx refilled per protocol. DJR  

## 2013-12-08 ENCOUNTER — Telehealth: Payer: Self-pay | Admitting: *Deleted

## 2013-12-08 NOTE — Telephone Encounter (Signed)
UDS collected 11/12/13 - low risk

## 2013-12-09 ENCOUNTER — Telehealth: Payer: Self-pay | Admitting: *Deleted

## 2013-12-09 NOTE — Telephone Encounter (Signed)
UDS collected 11/12/13 - Low Risk

## 2014-02-23 ENCOUNTER — Telehealth: Payer: Self-pay | Admitting: Internal Medicine

## 2014-02-23 NOTE — Telephone Encounter (Signed)
Patient called to inform dr Drue NovelPaz that his blood pressure has been running a little high. Its been 150/90. Please advise.

## 2014-02-23 NOTE — Telephone Encounter (Signed)
Recommend to watch closely salt intake, a high salt diet and he increase his BP; avoid daily or excessive motrin or similar meds . Continue checking his BPs daily, if they are consistently more than 145/85 please call back ---->  may need to start a medication

## 2014-02-24 MED ORDER — LOSARTAN POTASSIUM 50 MG PO TABS: 50.0000 mg | ORAL_TABLET | Freq: Every day | ORAL | Status: DC

## 2014-02-24 NOTE — Addendum Note (Signed)
Addended by: Willow OraPAZ, Simone Tuckey E on: 02/24/2014 11:10 AM   Modules accepted: Orders

## 2014-02-24 NOTE — Telephone Encounter (Addendum)
I sent a prescription for losartan 50 mg one by mouth daily. Patient to start medication and arrange for a BMP in 2 weeks dx HTN Cont monitoring BPs

## 2014-02-24 NOTE — Telephone Encounter (Signed)
Pt notified . Pt scheduled for labs.  

## 2014-02-24 NOTE — Telephone Encounter (Signed)
Pt states does not take motrin or similar meds also that his diet doesn't consist of high salt . B/p has been averaging 150/90 the past month. Pt willing to take b/p medication or wait another couple weeks . Please advise.

## 2014-03-12 ENCOUNTER — Telehealth: Payer: Self-pay | Admitting: Internal Medicine

## 2014-03-12 ENCOUNTER — Other Ambulatory Visit (INDEPENDENT_AMBULATORY_CARE_PROVIDER_SITE_OTHER): Payer: BC Managed Care – PPO

## 2014-03-12 DIAGNOSIS — I10 Essential (primary) hypertension: Secondary | ICD-10-CM

## 2014-03-12 LAB — BASIC METABOLIC PANEL
BUN: 9 mg/dL (ref 6–23)
CALCIUM: 9 mg/dL (ref 8.4–10.5)
CO2: 22 mEq/L (ref 19–32)
Chloride: 99 mEq/L (ref 96–112)
Creatinine, Ser: 0.7 mg/dL (ref 0.4–1.5)
GFR: 138.58 mL/min (ref >60.00)
GLUCOSE: 101 mg/dL — AB (ref 70–99)
POTASSIUM: 4 meq/L (ref 3.5–5.1)
SODIUM: 131 meq/L — AB (ref 135–145)

## 2014-03-12 NOTE — Telephone Encounter (Signed)
Pt had lab appt this afternoon and stopped by on his way out of the office and told us he has received a jury summons.  He is inquiring if Dr. Drue NovelPaz can write a letter to relieve him of this duty.  Does he need an appt or is this something Dr. Drue NovelPaz can do without scheduling an office visit?  Please advise.

## 2014-03-16 NOTE — Telephone Encounter (Signed)
Pt states doesn't want to be on jury duty due to his irritable bowel syndrome. Pt wants to know if you could still write the letter or if theres anything else he could do.

## 2014-03-16 NOTE — Telephone Encounter (Signed)
I only write letters to disabled people, wheelchair bound, homebound from a stroke or advanced age

## 2014-03-16 NOTE — Telephone Encounter (Signed)
Unable to do (I sign letters for disable people only)

## 2014-03-17 ENCOUNTER — Other Ambulatory Visit: Payer: Self-pay | Admitting: Internal Medicine

## 2014-03-17 NOTE — Telephone Encounter (Signed)
Pt.notified

## 2014-03-24 ENCOUNTER — Encounter: Payer: Self-pay | Admitting: Internal Medicine

## 2014-03-26 ENCOUNTER — Other Ambulatory Visit: Payer: Self-pay | Admitting: Internal Medicine

## 2014-05-30 ENCOUNTER — Emergency Department (HOSPITAL_COMMUNITY)
Admission: EM | Admit: 2014-05-30 | Discharge: 2014-05-31 | Disposition: A | Discharge status: Home / Self Care | Payer: BC Managed Care – PPO | Attending: Emergency Medicine | Admitting: Emergency Medicine

## 2014-05-30 ENCOUNTER — Encounter (HOSPITAL_COMMUNITY): Payer: Self-pay | Admitting: Emergency Medicine

## 2014-05-30 DIAGNOSIS — K589 Irritable bowel syndrome without diarrhea: Secondary | ICD-10-CM | POA: Insufficient documentation

## 2014-05-30 DIAGNOSIS — F3289 Other specified depressive episodes: Secondary | ICD-10-CM | POA: Insufficient documentation

## 2014-05-30 DIAGNOSIS — Z79899 Other long term (current) drug therapy: Secondary | ICD-10-CM | POA: Insufficient documentation

## 2014-05-30 DIAGNOSIS — Y929 Unspecified place or not applicable: Secondary | ICD-10-CM | POA: Insufficient documentation

## 2014-05-30 DIAGNOSIS — W260XXA Contact with knife, initial encounter: Secondary | ICD-10-CM | POA: Insufficient documentation

## 2014-05-30 DIAGNOSIS — S61219A Laceration without foreign body of unspecified finger without damage to nail, initial encounter: Secondary | ICD-10-CM

## 2014-05-30 DIAGNOSIS — Y9389 Activity, other specified: Secondary | ICD-10-CM | POA: Insufficient documentation

## 2014-05-30 DIAGNOSIS — Z23 Encounter for immunization: Secondary | ICD-10-CM | POA: Insufficient documentation

## 2014-05-30 DIAGNOSIS — G8929 Other chronic pain: Secondary | ICD-10-CM | POA: Insufficient documentation

## 2014-05-30 DIAGNOSIS — F329 Major depressive disorder, single episode, unspecified: Secondary | ICD-10-CM | POA: Insufficient documentation

## 2014-05-30 DIAGNOSIS — I10 Essential (primary) hypertension: Secondary | ICD-10-CM | POA: Insufficient documentation

## 2014-05-30 DIAGNOSIS — F411 Generalized anxiety disorder: Secondary | ICD-10-CM | POA: Insufficient documentation

## 2014-05-30 DIAGNOSIS — Z8739 Personal history of other diseases of the musculoskeletal system and connective tissue: Secondary | ICD-10-CM | POA: Insufficient documentation

## 2014-05-30 DIAGNOSIS — W261XXA Contact with sword or dagger, initial encounter: Secondary | ICD-10-CM

## 2014-05-30 DIAGNOSIS — S61209A Unspecified open wound of unspecified finger without damage to nail, initial encounter: Secondary | ICD-10-CM | POA: Insufficient documentation

## 2014-05-30 MED ORDER — TETANUS-DIPHTH-ACELL PERTUSSIS 5-2.5-18.5 LF-MCG/0.5 IM SUSP
0.5000 mL | Freq: Once | INTRAMUSCULAR | Status: AC
  Administered 2014-05-30: 0.5 mL via INTRAMUSCULAR
  Filled 2014-05-30: qty 0.5

## 2014-05-30 NOTE — ED Provider Notes (Signed)
CSN: [id number]     Arrival date & time 05/30/14  2150 History  This chart was scribed for non-physician practitioner working with Glynn OctaveStephen Rancour, MD by Elveria Risingimelie Horne, ED Scribe. This patient was seen in room TR05C/TR05C and the patient's care was started at 11:18 PM.   Chief Complaint  Patient presents with  . Extremity Laceration     The history is provided by the patient. No language interpreter was used.   HPI Comments: Edwin Vaughan is a 43 y.o. male who presents to the Emergency Department with left second finger laceration that occurred tonight. Patient reports cutting his finger with a serrated knife while slicing head of cabbage. Patient denies current pain. Patient in need of updated Tetanus.  Denies numbness or tingling.   Past Medical History  Diagnosis Date  . IBS (irritable bowel syndrome)   . Hypertension     "just started RX this past weekend" (06/17/2013)  . Headache(784.0)     "most days recently related to my blood pressure" (06/17/2013)  . Arthritis     "lower back" (06/17/2013)  . Chronic lower back pain     "bulging disc; see chiropractor once/month" (06/17/2013)  . Environmental allergies     "pretty much anything that grows, I'm allergic to it" (06/17/2013)  . Anxiety and depression     admited 06-2013    Past Surgical History  Procedure Laterality Date  . Wisdom tooth extraction  2000's   Family History  Problem Relation Age of Onset  . Hypertension Mother   . Stroke Mother   . Cancer Father    History  Substance Use Topics  . Smoking status: Never Smoker   . Smokeless tobacco: Never Used  . Alcohol Use: No     Comment: 06/17/2013 "1-2 drinks/yr"    Review of Systems  Constitutional: Negative for fever and chills.  Skin: Positive for wound.  Neurological: Negative for weakness and numbness.      Allergies  Shellfish allergy  Home Medications   Prior to Admission medications   Medication Sig Start Date End Date Taking? Authorizing Provider   diazepam (VALIUM) 5 MG tablet Take 1 tablet (5 mg total) by mouth every 12 (twelve) hours as needed for anxiety or sleep. 06/21/13   John-Adam Bonk, MD  escitalopram (LEXAPRO) 10 MG tablet take 1 and 1/2 tablets by mouth once daily    Wanda PlumpJose E Paz, MD  GARLIC PO Take 1 capsule by mouth 2 (two) times daily.    Historical Provider, MD  losartan (COZAAR) 50 MG tablet take 1 tablet by mouth once daily    Wanda PlumpJose E Paz, MD  OIL OF OREGANO PO Take 1 capsule by mouth 2 (two) times daily.    Historical Provider, MD  OVER THE COUNTER MEDICATION Take 1 tablet by mouth 3 (three) times daily before meals. Digest Spectrum for IBS    Historical Provider, MD  OVER THE COUNTER MEDICATION Take 1 tablet by mouth 3 (three) times daily before meals. HCL with pepsin for IBS    Historical Provider, MD  psyllium (METAMUCIL) 58.6 % powder Take 1 packet by mouth 3 (three) times daily before meals.     Historical Provider, MD  QUERCETIN PO Take 1 tablet by mouth daily.    Historical Provider, MD  zolpidem (AMBIEN) 5 MG tablet Take 1 tablet (5 mg total) by mouth at bedtime as needed for sleep (insomnia). 06/24/13   Marinda ElkAbraham Feliz Ortiz, MD   Triage Vitals: BP 145/105  Pulse 112  Temp(Src) 98.2 F (36.8 C) (Oral)  Resp 18  SpO2 98% Physical Exam  Nursing note and vitals reviewed. Constitutional: He is oriented to person, place, and time. He appears well-developed and well-nourished. No distress.  HENT:  Head: Normocephalic and atraumatic.  Eyes: EOM are normal.  Neck: Neck supple.  Cardiovascular: Normal rate, regular rhythm, normal heart sounds and intact distal pulses.   Pulmonary/Chest: Effort normal and breath sounds normal. No respiratory distress.  Musculoskeletal: Normal range of motion.  Left hand: 2+radial pulse. Good cap refill of left index finger. 1.5 cm superficial linear laceration located at the medial aspect of the left index finger in between PIP and DIP. Nongaping. No active bleeding. Full ROM of his left  index finger and MCP, PIP and DIP. Good muscle strength against resistance at the MCP, PIP, and DIP.   Neurological: He is alert and oriented to person, place, and time.  Skin: Skin is warm and dry.  Psychiatric: He has a normal mood and affect. His behavior is normal.    ED Course  Procedures (including critical care time) COORDINATION OF CARE: 11:23 PM- Will seal laceration with Dermabond. Discussed treatment plan with patient at bedside and patient agreed to plan.   Labs Review Labs Reviewed - No data to display  Imaging Review No results found.   EKG Interpretation None     LACERATION REPAIR Performed by: Santiago GladLaisure, Heather Authorized by: Santiago GladLaisure, Heather Consent: Verbal consent obtained. Risks and benefits: risks, benefits and alternatives were discussed Consent given by: patient Patient identity confirmed: provided demographic data Prepped and Draped in normal sterile fashion Wound explored  Laceration Location: left index finger  Laceration Length: 1.5 cm  No Foreign Bodies seen or palpated  Irrigation method: syringe Amount of cleaning: standard  Skin closure: Dermabond   Technique:  Dermabond  Patient tolerance: Patient tolerated the procedure well with no immediate complications.  MDM   Final diagnoses:  None   Patient presents with a superficial laceration of the left index finger.  Laceration is non gaping.  No tendon involvement.  Patient neurovascularly intact.  Laceration repaired with Dermabond.  Patient stable for discharge.  Return precautions given.    I personally performed the services described in this documentation, which was scribed in my presence. The recorded information has been reviewed and is accurate.    Santiago GladHeather Laisure, PA-C 05/31/14 [phone]

## 2014-05-30 NOTE — ED Notes (Signed)
Pt has laceration to left pointer finger from serrated knife this evening. Bleeding controlled with pressure bandage. Cap refill <3NAD.

## 2014-05-31 NOTE — ED Provider Notes (Signed)
Medical screening examination/treatment/procedure(s) were performed by non-physician practitioner and as supervising physician I was immediately available for consultation/collaboration.   EKG Interpretation None        Glynn OctaveStephen Lennyx Verdell, MD 05/31/14 [phone]

## 2014-05-31 NOTE — ED Notes (Signed)
Error in charting order placed with incorrect sign in name . Order placed per Celine AhrAudrey Mckeown RN.

## 2014-05-31 NOTE — ED Notes (Signed)
Pt ambulating independently w/ steady gait on d/c in no acute distress, A&Ox4. D/c instructions reviewed w/ pt - pt denies any further questions or concerns at present.  

## 2014-06-05 ENCOUNTER — Encounter: Payer: Self-pay | Admitting: Internal Medicine

## 2014-06-05 ENCOUNTER — Ambulatory Visit (INDEPENDENT_AMBULATORY_CARE_PROVIDER_SITE_OTHER): Payer: BC Managed Care – PPO | Admitting: Internal Medicine

## 2014-06-05 VITALS — BP 107/72 | HR 88 | Temp 98.0°F | Wt 218.0 lb

## 2014-06-05 DIAGNOSIS — S6990XA Unspecified injury of unspecified wrist, hand and finger(s), initial encounter: Secondary | ICD-10-CM

## 2014-06-05 DIAGNOSIS — S6992XD Unspecified injury of left wrist, hand and finger(s), subsequent encounter: Secondary | ICD-10-CM

## 2014-06-05 DIAGNOSIS — Z5189 Encounter for other specified aftercare: Secondary | ICD-10-CM

## 2014-06-05 DIAGNOSIS — S6980XA Other specified injuries of unspecified wrist, hand and finger(s), initial encounter: Secondary | ICD-10-CM

## 2014-06-05 NOTE — Progress Notes (Signed)
Pre visit review using our clinic review tool, if applicable. No additional management support is needed unless otherwise documented below in the visit note. 

## 2014-06-05 NOTE — Progress Notes (Signed)
Subjective:    Patient ID: Edwin SimpersKermit Pun Jr., male    DOB: September 16, 1971, 43 y.o.   MRN: [id number]  DOS:  06/05/2014 Type of  Visit: ER f/u History: .Micah FlesherWent to the ER 6 days ago with a superficial laceration of the second left finger. Dermabond was placed it lasted only a few hours, denies pain, swelling, discharge or redness. ER notes are reviewed     Past Medical History  Diagnosis Date  . IBS (irritable bowel syndrome)   . Hypertension     "just started RX this past weekend" (06/17/2013)  . Headache(784.0)     "most days recently related to my blood pressure" (06/17/2013)  . Arthritis     "lower back" (06/17/2013)  . Chronic lower back pain     "bulging disc; see chiropractor once/month" (06/17/2013)  . Environmental allergies     "pretty much anything that grows, I'm allergic to it" (06/17/2013)  . Anxiety and depression     admited 06-2013     Past Surgical History  Procedure Laterality Date  . Wisdom tooth extraction  2000's    History   Social History  . Marital Status: Single    Spouse Name: N/A    Number of Children: 0  . Years of Education: N/A   Occupational History  . no working at present    Social History Main Topics  . Smoking status: Never Smoker   . Smokeless tobacco: Never Used  . Alcohol Use: No     Comment: 06/17/2013 "1-2 drinks/yr"  . Drug Use: No  . Sexual Activity: No   Other Topics Concern  . Not on file   Social History Narrative   No job at present,  Single,  No children.   Lives at mother's house         Medication List       This list is accurate as of: 06/05/14 11:59 PM.  Always use your most recent med list.               diazepam 5 MG tablet  Commonly known as:  VALIUM  Take 1 tablet (5 mg total) by mouth every 12 (twelve) hours as needed for anxiety or sleep.     escitalopram 10 MG tablet  Commonly known as:  LEXAPRO  take 1 and 1/2 tablets by mouth once daily     GARLIC PO  Take 1 capsule by mouth 2 (two) times daily.     losartan 50 MG tablet  Commonly known as:  COZAAR  take 1 tablet by mouth once daily     OIL OF OREGANO PO  Take 1 capsule by mouth 2 (two) times daily.     OVER THE COUNTER MEDICATION  Take 1 tablet by mouth 3 (three) times daily before meals. Digest Spectrum for IBS     OVER THE COUNTER MEDICATION  Take 1 tablet by mouth 3 (three) times daily before meals. HCL with pepsin for IBS     psyllium 58.6 % powder  Commonly known as:  METAMUCIL  Take 1 packet by mouth 3 (three) times daily before meals.     QUERCETIN PO  Take 1 tablet by mouth daily.     zolpidem 5 MG tablet  Commonly known as:  AMBIEN  Take 1 tablet (5 mg total) by mouth at bedtime as needed for sleep (insomnia).           Objective:   Physical Exam BP 107/72  Pulse 88  Temp(Src) 98 F (36.7 C)  Wt 218 lb (98.884 kg)  SpO2 96% General -- alert, well-developed, NAD.   Extremities--  Left second finger with a superficial 1.5 cm curved, no redness, discharge, swelling. Range of motion of the finger normal Psych-- Cognition and judgment appear intact. Cooperative with normal attention span and concentration. No anxious or depressed appearing.        Assessment & Plan:   Superficial finger  injury, Recommend to keep it cover w/  a Band-Aid and antibiotic ointment for the next few days during the daytime, leave it open at night. Call if evidence of infection.  Due for a physical, recommend to schedule at his convenience

## 2014-06-19 ENCOUNTER — Encounter: Payer: Self-pay | Admitting: Internal Medicine

## 2014-06-19 ENCOUNTER — Ambulatory Visit (INDEPENDENT_AMBULATORY_CARE_PROVIDER_SITE_OTHER): Payer: BC Managed Care – PPO | Admitting: Internal Medicine

## 2014-06-19 VITALS — BP 107/70 | HR 94 | Temp 97.8°F | Wt 222.0 lb

## 2014-06-19 DIAGNOSIS — L237 Allergic contact dermatitis due to plants, except food: Secondary | ICD-10-CM

## 2014-06-19 DIAGNOSIS — L255 Unspecified contact dermatitis due to plants, except food: Secondary | ICD-10-CM

## 2014-06-19 MED ORDER — MOMETASONE FUROATE 0.1 % EX OINT: TOPICAL_OINTMENT | Freq: Two times a day (BID) | CUTANEOUS | Status: DC

## 2014-06-19 MED ORDER — PREDNISONE 10 MG PO TABS: ORAL_TABLET | ORAL | Status: DC

## 2014-06-19 NOTE — Progress Notes (Signed)
Subjective:    Patient ID: Edwin Simpers., male    DOB: 01/18/1971, 43 y.o.   MRN: [id number]  DOS:  06/19/2014 Type of visit - description: acute History: He does yard work often, has seen poison ivy, developed a rash, very pruritic, few days ago. Took left over prednisone (2 tabs)  without much help.  ROS No F/C, no joint aches   Past Medical History  Diagnosis Date  . IBS (irritable bowel syndrome)   . Hypertension     "just started RX this past weekend" (06/17/2013)  . Headache(784.0)     "most days recently related to my blood pressure" (06/17/2013)  . Arthritis     "lower back" (06/17/2013)  . Chronic lower back pain     "bulging disc; see chiropractor once/month" (06/17/2013)  . Environmental allergies     "pretty much anything that grows, I'm allergic to it" (06/17/2013)  . Anxiety and depression     admited 06-2013     Past Surgical History  Procedure Laterality Date  . Wisdom tooth extraction  2000's    History   Social History  . Marital Status: Single    Spouse Name: N/A    Number of Children: 0  . Years of Education: N/A   Occupational History  . no working at present    Social History Main Topics  . Smoking status: Never Smoker   . Smokeless tobacco: Never Used  . Alcohol Use: No     Comment: 06/17/2013 "1-2 drinks/yr"  . Drug Use: No  . Sexual Activity: No   Other Topics Concern  . Not on file   Social History Narrative   No job at present,  Single,  No children.   Lives at mother's house         Medication List       This list is accurate as of: 06/19/14 11:59 PM.  Always use your most recent med list.               diazepam 5 MG tablet  Commonly known as:  VALIUM  Take 1 tablet (5 mg total) by mouth every 12 (twelve) hours as needed for anxiety or sleep.     escitalopram 10 MG tablet  Commonly known as:  LEXAPRO  take 1 and 1/2 tablets by mouth once daily     GARLIC PO  Take 1 capsule by mouth 2 (two) times daily.     losartan  50 MG tablet  Commonly known as:  COZAAR  take 1 tablet by mouth once daily     mometasone 0.1 % ointment  Commonly known as:  ELOCON  Apply topically 2 (two) times daily.     OIL OF OREGANO PO  Take 1 capsule by mouth 2 (two) times daily.     OVER THE COUNTER MEDICATION  Take 1 tablet by mouth 3 (three) times daily before meals. Digest Spectrum for IBS     OVER THE COUNTER MEDICATION  Take 1 tablet by mouth 3 (three) times daily before meals. HCL with pepsin for IBS     predniSONE 10 MG tablet  Commonly known as:  DELTASONE  3 tabs x 3 days, 2 tabs x 3 days, 1 tab x 3 days     psyllium 58.6 % powder  Commonly known as:  METAMUCIL  Take 1 packet by mouth 3 (three) times daily before meals.     QUERCETIN PO  Take 1 tablet by mouth daily.  zolpidem 5 MG tablet  Commonly known as:  AMBIEN  Take 1 tablet (5 mg total) by mouth at bedtime as needed for sleep (insomnia).           Objective:   Physical Exam  Constitutional: He appears well-developed and well-nourished.  Skin:     Psychiatric: He has a normal mood and affect. His behavior is normal. Judgment and thought content normal.   BP 107/70  Pulse 94  Temp(Src) 97.8 F (36.6 C)  Wt 222 lb (100.699 kg)  SpO2 98%       Assessment & Plan:   Contact dermatitis History and physical consistent with contact dermatitis Will prescribe topical and oral steroids. Recommend to wash all  his clothing and avoidance.  Also he is due for her physical, encouraged to make an appointment

## 2014-06-19 NOTE — Progress Notes (Signed)
Pre visit review using our clinic review tool, if applicable. No additional management support is needed unless otherwise documented below in the visit note. 

## 2014-06-19 NOTE — Patient Instructions (Signed)
Please schedule a fasting physical at your earliest convenience

## 2014-06-28 ENCOUNTER — Other Ambulatory Visit: Payer: Self-pay | Admitting: Internal Medicine

## 2014-06-29 ENCOUNTER — Other Ambulatory Visit: Payer: Self-pay | Admitting: Internal Medicine

## 2014-09-07 ENCOUNTER — Encounter: Payer: Self-pay | Admitting: Internal Medicine

## 2014-09-07 ENCOUNTER — Ambulatory Visit (INDEPENDENT_AMBULATORY_CARE_PROVIDER_SITE_OTHER): Payer: BC Managed Care – PPO | Admitting: Internal Medicine

## 2014-09-07 VITALS — BP 122/68 | HR 90 | Temp 98.2°F | Wt 220.0 lb

## 2014-09-07 DIAGNOSIS — J069 Acute upper respiratory infection, unspecified: Secondary | ICD-10-CM

## 2014-09-07 DIAGNOSIS — J020 Streptococcal pharyngitis: Secondary | ICD-10-CM

## 2014-09-07 LAB — POCT RAPID STREP A (OFFICE): Rapid Strep A Screen: NEGATIVE

## 2014-09-07 MED ORDER — AMOXICILLIN 500 MG PO CAPS: 1000.0000 mg | ORAL_CAPSULE | Freq: Two times a day (BID) | ORAL | Status: DC

## 2014-09-07 NOTE — Progress Notes (Signed)
Pre visit review using our clinic review tool, if applicable. No additional management support is needed unless otherwise documented below in the visit note. 

## 2014-09-07 NOTE — Patient Instructions (Signed)
Rest, fluids , tylenol For cough, take Mucinex DM twice a day as needed  For congestion use OTC Nasocort: 2 nasal sprays on each side of the nose daily until you feel better Also take claritin 10 mg OTC 1 tab daily until better  Take the antibiotic as prescribed  (Amoxicillin) if not improving in the next 3-4 days  Call if not gradually better over the next 10 days Call anytime if the symptoms are severe, you have

## 2014-09-07 NOTE — Progress Notes (Signed)
Subjective:    Patient ID: Edwin Vaughan., male    DOB: 13-Jul-1971, 43 y.o.   MRN: [id number]  DOS:  09/07/2014 Type of visit - description : Acute  Interval history: Symptoms started about 2 weeks ago with cough, sinus congestion, sore throat, postnasal dripping, hacking cough. He is taking only a natural remedy for cough OTC, not better  ROS Denies nausea, vomiting, diarrhea. No myalgias No fever or chills per se No itchy eyes or itchy nose   Past Medical History  Diagnosis Date  . IBS (irritable bowel syndrome)   . Hypertension     "just started RX this past weekend" (06/17/2013)  . Headache(784.0)     "most days recently related to my blood pressure" (06/17/2013)  . Arthritis     "lower back" (06/17/2013)  . Chronic lower back pain     "bulging disc; see chiropractor once/month" (06/17/2013)  . Environmental allergies     "pretty much anything that grows, I'm allergic to it" (06/17/2013)  . Anxiety and depression     admited 06-2013     Past Surgical History  Procedure Laterality Date  . Wisdom tooth extraction  2000's    History   Social History  . Marital Status: Single    Spouse Name: N/A    Number of Children: 0  . Years of Education: N/A   Occupational History  . no working at present    Social History Main Topics  . Smoking status: Never Smoker   . Smokeless tobacco: Never Used  . Alcohol Use: No     Comment: 06/17/2013 "1-2 drinks/yr"  . Drug Use: No  . Sexual Activity: No   Other Topics Concern  . Not on file   Social History Narrative   No job at present,  Single,  No children.   Lives at mother's house         Medication List       This list is accurate as of: 09/07/14  5:42 PM.  Always use your most recent med list.               amoxicillin 500 MG capsule  Commonly known as:  AMOXIL  Take 2 capsules (1,000 mg total) by mouth 2 (two) times daily.     diazepam 5 MG tablet  Commonly known as:  VALIUM  Take 1 tablet (5 mg total) by  mouth every 12 (twelve) hours as needed for anxiety or sleep.     escitalopram 10 MG tablet  Commonly known as:  LEXAPRO  take 1.5 tablets by mouth once daily     GARLIC PO  Take 1 capsule by mouth 2 (two) times daily.     losartan 50 MG tablet  Commonly known as:  COZAAR  take 1 tablet by mouth once daily     OIL OF OREGANO PO  Take 1 capsule by mouth 2 (two) times daily.     OVER THE COUNTER MEDICATION  Take 1 tablet by mouth 3 (three) times daily before meals. Digest Spectrum for IBS     OVER THE COUNTER MEDICATION  Take 1 tablet by mouth 3 (three) times daily before meals. HCL with pepsin for IBS     psyllium 58.6 % powder  Commonly known as:  METAMUCIL  Take 1 packet by mouth 3 (three) times daily before meals.     QUERCETIN PO  Take 1 tablet by mouth daily.     zolpidem 5 MG tablet  Commonly  known as:  AMBIEN  Take 1 tablet (5 mg total) by mouth at bedtime as needed for sleep (insomnia).           Objective:   Physical Exam BP 122/68  Pulse 90  Temp(Src) 98.2 F (36.8 C) (Oral)  Wt 220 lb (99.791 kg)  SpO2 98% General -- alert, well-developed, NAD.  HEENT-- Not pale.  R Ear-- normal L ear-- normal Throat symmetric, no redness or discharge. Face symmetric, sinuses not  tender to palpation. Nose slt congested.  Lungs -- normal respiratory effort, no intercostal retractions, no accessory muscle use, and normal breath sounds.  Heart-- normal rate, regular rhythm, no murmur.   Extremities-- no pretibial edema bilaterally  Neurologic--  alert & oriented X3. Speech normal, gait appropriate for age, strength symmetric and appropriate for age.  Psych-- Cognition and judgment appear intact. Cooperative with normal attention span and concentration. No anxious or depressed appearing.     Assessment & Plan:  Upper respiratory symptoms, URI versus allergies. Will try conservative treatment first, see instructions. If not better he will start antibiotics for one  week. Strep test today was negative

## 2014-10-16 ENCOUNTER — Other Ambulatory Visit: Payer: Self-pay | Admitting: Internal Medicine

## 2015-02-05 ENCOUNTER — Other Ambulatory Visit: Payer: Self-pay | Admitting: Internal Medicine

## 2015-06-07 ENCOUNTER — Other Ambulatory Visit: Payer: Self-pay | Admitting: Internal Medicine

## 2015-07-19 ENCOUNTER — Other Ambulatory Visit: Payer: Self-pay | Admitting: Internal Medicine

## 2015-08-03 ENCOUNTER — Emergency Department (HOSPITAL_COMMUNITY)
Admission: EM | Admit: 2015-08-03 | Discharge: 2015-08-03 | Disposition: A | Discharge status: Home / Self Care | Payer: BLUE CROSS/BLUE SHIELD | Attending: Emergency Medicine | Admitting: Emergency Medicine

## 2015-08-03 ENCOUNTER — Encounter (HOSPITAL_COMMUNITY): Payer: Self-pay | Admitting: *Deleted

## 2015-08-03 DIAGNOSIS — R202 Paresthesia of skin: Secondary | ICD-10-CM | POA: Diagnosis present

## 2015-08-03 DIAGNOSIS — Z8739 Personal history of other diseases of the musculoskeletal system and connective tissue: Secondary | ICD-10-CM | POA: Diagnosis not present

## 2015-08-03 DIAGNOSIS — I10 Essential (primary) hypertension: Secondary | ICD-10-CM

## 2015-08-03 DIAGNOSIS — F419 Anxiety disorder, unspecified: Secondary | ICD-10-CM | POA: Insufficient documentation

## 2015-08-03 DIAGNOSIS — G8929 Other chronic pain: Secondary | ICD-10-CM | POA: Diagnosis not present

## 2015-08-03 DIAGNOSIS — F329 Major depressive disorder, single episode, unspecified: Secondary | ICD-10-CM | POA: Diagnosis not present

## 2015-08-03 DIAGNOSIS — Z79899 Other long term (current) drug therapy: Secondary | ICD-10-CM | POA: Insufficient documentation

## 2015-08-03 NOTE — ED Provider Notes (Signed)
CSN: [id number]     Arrival date & time 08/03/15  1305 History   First MD Initiated Contact with Patient 08/03/15 1527     Chief Complaint  Patient presents with  . Tingling     (Consider location/radiation/quality/duration/timing/severity/associated sxs/prior Treatment) HPI Comments: 44 year old male with history of elevated blood pressure, psychogenic polydipsia, IBS presents with for head tingling for the past 2 weeks. Patient noticed since falling asleep on his hand on his 4 head views had intermittent tingling worse in the right upper for head but is also had in the left upper 4 head. No other neurologic symptoms. No stroke history. Family history of stroke and high blood pressure. Patient goes through spells of anxiety, fasting and then eating fast food. Patient feels at baseline with his other medical problems. No blood thinners. Nothing specifically improves or worsens.  The history is provided by the patient.    Past Medical History  Diagnosis Date  . IBS (irritable bowel syndrome)   . Hypertension     "just started RX this past weekend" (06/17/2013)  . Headache(784.0)     "most days recently related to my blood pressure" (06/17/2013)  . Arthritis     "lower back" (06/17/2013)  . Chronic lower back pain     "bulging disc; see chiropractor once/month" (06/17/2013)  . Environmental allergies     "pretty much anything that grows, I'm allergic to it" (06/17/2013)  . Anxiety and depression     admited 06-2013    Past Surgical History  Procedure Laterality Date  . Wisdom tooth extraction  2000's   Family History  Problem Relation Age of Onset  . Hypertension Mother   . Stroke Mother   . Cancer Father    Social History  Substance Use Topics  . Smoking status: Never Smoker   . Smokeless tobacco: Never Used  . Alcohol Use: No     Comment: 06/17/2013 "1-2 drinks/yr"    Review of Systems  Constitutional: Negative for fever and chills.  HENT: Negative for congestion.   Eyes:  Negative for visual disturbance.  Respiratory: Negative for shortness of breath.   Cardiovascular: Negative for chest pain.  Gastrointestinal: Negative for vomiting and abdominal pain.  Genitourinary: Negative for dysuria and flank pain.  Musculoskeletal: Negative for back pain, neck pain and neck stiffness.  Skin: Negative for rash.  Neurological: Negative for speech difficulty, weakness, light-headedness and headaches.  Psychiatric/Behavioral: The patient is nervous/anxious.       Allergies  Shellfish allergy  Home Medications   Prior to Admission medications   Medication Sig Start Date End Date Taking? Authorizing Provider  amoxicillin (AMOXIL) 500 MG capsule Take 2 capsules (1,000 mg total) by mouth 2 (two) times daily. 09/07/14   Wanda Plump, MD  diazepam (VALIUM) 5 MG tablet Take 1 tablet (5 mg total) by mouth every 12 (twelve) hours as needed for anxiety or sleep. 06/21/13   John-Adam Bonk, MD  escitalopram (LEXAPRO) 10 MG tablet Take 1.5 tablets (15 mg total) by mouth daily. 06/07/15   Wanda Plump, MD  GARLIC PO Take 1 capsule by mouth 2 (two) times daily.    Historical Provider, MD  losartan (COZAAR) 50 MG tablet Take 1 tablet (50 mg total) by mouth daily. 07/19/15   Wanda Plump, MD  OIL OF OREGANO PO Take 1 capsule by mouth 2 (two) times daily.    Historical Provider, MD  OVER THE COUNTER MEDICATION Take 1 tablet by mouth 3 (three) times daily before  meals. Digest Spectrum for IBS    Historical Provider, MD  OVER THE COUNTER MEDICATION Take 1 tablet by mouth 3 (three) times daily before meals. HCL with pepsin for IBS    Historical Provider, MD  psyllium (METAMUCIL) 58.6 % powder Take 1 packet by mouth 3 (three) times daily before meals.     Historical Provider, MD  QUERCETIN PO Take 1 tablet by mouth daily.    Historical Provider, MD  zolpidem (AMBIEN) 5 MG tablet Take 1 tablet (5 mg total) by mouth at bedtime as needed for sleep (insomnia). 06/24/13   Marinda Elk, MD   BP  143/96 mmHg  Pulse 87  Temp(Src) 98.1 F (36.7 C) (Oral)  Resp 16  Ht  (1.651 m)  Wt 215 lb (97.523 kg)  BMI 35.78 kg/m2  SpO2 99% Physical Exam  Constitutional: He is oriented to person, place, and time. He appears well-developed and well-nourished.  HENT:  Head: Normocephalic and atraumatic.  Eyes: Conjunctivae are normal. Right eye exhibits no discharge. Left eye exhibits no discharge.  Neck: Normal range of motion. Neck supple. No tracheal deviation present.  Cardiovascular: Normal rate and regular rhythm.   Pulmonary/Chest: Effort normal and breath sounds normal.  Abdominal: Soft. He exhibits no distension. There is no tenderness. There is no guarding.  Musculoskeletal: He exhibits no edema.  Neurological: He is alert and oriented to person, place, and time. Coordination and gait normal. GCS eye subscore is 4. GCS verbal subscore is 5. GCS motor subscore is 6.  5+ strength in UE and LE with f/e at major joints. Sensation to palpation intact in UE and LE. CNs 2-12 grossly intact.  EOMFI.  PERRL.   Finger nose and coordination intact bilateral.   Visual fields intact to finger testing. No nystagmus   Skin: Skin is warm. No rash noted.  Psychiatric: He has a normal mood and affect.  Nursing note and vitals reviewed.   ED Course  Procedures (including critical care time) Labs Review Labs Reviewed - No data to display  Imaging Review No results found. I have personally reviewed and evaluated these images and lab results as part of my medical decision-making.   EKG Interpretation None      MDM   Final diagnoses:  Tingling  Essential hypertension   Patient presents with tingling to the 4 head intermittent bilateral the past 2 weeks. Patient has normal neurologic exam in the ER. Reassured the patient that no signs of acute stroke. Discussed outpatient follow-up and reasons to return for worsening neurologic symptoms or signs.  Results and differential diagnosis  were discussed with the patient/parent/guardian. Xrays were independently reviewed by myself.  Close follow up outpatient was discussed, comfortable with the plan.   Medications - No data to display  Filed Vitals:   08/03/15 1313 08/03/15 1539  BP: 139/97 143/96  Pulse: 103 87  Temp: 98.1 F (36.7 C)   TempSrc: Oral   Resp: 18 16  Height:  (1.651 m)   Weight: 215 lb (97.523 kg)   SpO2: 98% 99%    Final diagnoses:  Tingling  Essential hypertension       Blane Ohara, MD 08/03/15 [phone]

## 2015-08-03 NOTE — Discharge Instructions (Signed)
If you were given medicines take as directed.  If you are on coumadin or contraceptives realize their levels and effectiveness is altered by many different medicines.  If you have any reaction (rash, tongues swelling, other) to the medicines stop taking and see a physician.    If your blood pressure was elevated in the ER make sure you follow up for management with a primary doctor or return for chest pain, shortness of breath or stroke symptoms.  Please follow up as directed and return to the ER or see a physician for new or worsening symptoms.  Thank you. Filed Vitals:   08/03/15 1313  BP: 139/97  Pulse: 103  Temp: 98.1 F (36.7 C)  TempSrc: Oral  Resp: 18  Height:  (1.651 m)  Weight: 215 lb (97.523 kg)  SpO2: 98%

## 2015-08-03 NOTE — ED Notes (Signed)
Pt reports tingling to his forehead. Pt states that he fell asleep on his had a few weeks ago hand has noticed it since then. Pt also reports intermittent headaches. Pt reports hx of high bp but states that he is taking his medications as prescribed.

## 2015-08-05 ENCOUNTER — Ambulatory Visit (INDEPENDENT_AMBULATORY_CARE_PROVIDER_SITE_OTHER): Payer: BLUE CROSS/BLUE SHIELD | Admitting: Internal Medicine

## 2015-08-05 ENCOUNTER — Encounter: Payer: Self-pay | Admitting: Internal Medicine

## 2015-08-05 VITALS — BP 136/78 | HR 106 | Temp 98.7°F | Ht 66.0 in | Wt 238.2 lb

## 2015-08-05 DIAGNOSIS — E871 Hypo-osmolality and hyponatremia: Secondary | ICD-10-CM | POA: Diagnosis not present

## 2015-08-05 DIAGNOSIS — F419 Anxiety disorder, unspecified: Secondary | ICD-10-CM

## 2015-08-05 DIAGNOSIS — I10 Essential (primary) hypertension: Secondary | ICD-10-CM | POA: Insufficient documentation

## 2015-08-05 DIAGNOSIS — R1314 Dysphagia, pharyngoesophageal phase: Secondary | ICD-10-CM

## 2015-08-05 LAB — CBC WITH DIFFERENTIAL/PLATELET
BASOS PCT: 0.3 % (ref 0.0–3.0)
Basophils Absolute: 0 10*3/uL (ref 0.0–0.1)
EOS ABS: 0.1 10*3/uL (ref 0.0–0.7)
EOS PCT: 0.9 % (ref 0.0–5.0)
HCT: 41 % (ref 39.0–52.0)
HEMOGLOBIN: 14 g/dL (ref 13.0–17.0)
Lymphocytes Relative: 19 % (ref 12.0–46.0)
Lymphs Abs: 1.4 10*3/uL (ref 0.7–4.0)
MCHC: 34.3 g/dL (ref 30.0–36.0)
MCV: 89.9 fl (ref 78.0–100.0)
Monocytes Absolute: 0.6 10*3/uL (ref 0.1–1.0)
Monocytes Relative: 7.8 % (ref 3.0–12.0)
Neutro Abs: 5.3 10*3/uL (ref 1.4–7.7)
Neutrophils Relative %: 72 % (ref 43.0–77.0)
Platelets: 337 10*3/uL (ref 150.0–400.0)
RBC: 4.55 Mil/uL (ref 4.22–5.81)
RDW: 13.9 % (ref 11.5–15.5)
WBC: 7.4 10*3/uL (ref 4.0–10.5)

## 2015-08-05 LAB — BASIC METABOLIC PANEL
BUN: 10 mg/dL (ref 6–23)
CHLORIDE: 95 meq/L — AB (ref 96–112)
CO2: 27 mEq/L (ref 19–32)
Calcium: 9.4 mg/dL (ref 8.4–10.5)
Creatinine, Ser: 0.78 mg/dL (ref 0.40–1.50)
GFR: 115.51 mL/min (ref >60.00)
Glucose, Bld: 116 mg/dL — ABNORMAL HIGH (ref 70–99)
POTASSIUM: 4.3 meq/L (ref 3.5–5.1)
Sodium: 130 mEq/L — ABNORMAL LOW (ref 135–145)

## 2015-08-05 LAB — TSH: TSH: 1.51 u[IU]/mL (ref 0.35–4.50)

## 2015-08-05 MED ORDER — ESCITALOPRAM OXALATE 20 MG PO TABS: 20.0000 mg | ORAL_TABLET | Freq: Every day | ORAL | Status: DC

## 2015-08-05 MED ORDER — DIAZEPAM 5 MG PO TABS: 2.5000 mg | ORAL_TABLET | Freq: Three times a day (TID) | ORAL | Status: DC | PRN

## 2015-08-05 NOTE — Patient Instructions (Addendum)
Get your blood work before you leave   Increase Lexapro to 20 mg: 1 tablet daily  Take omeprazole 20 mg one before breakfast If the  problem swallowing get worse or you are not improving gradually: Call the office  Next Visit: as schedule

## 2015-08-05 NOTE — Progress Notes (Signed)
Pre visit review using our clinic review tool, if applicable. No additional management support is needed unless otherwise documented below in the visit note. 

## 2015-08-05 NOTE — Progress Notes (Signed)
Subjective:    Patient ID: Edwin Simpers., male    DOB: 1971-02-10, 44 y.o.   MRN: [id number]  DOS:  08/05/2015 Type of visit - description : Acute visit, we also discuss chronic medical problems Interval history: This morning, he felt he couldn't swallow water well, very vague description of symptoms, like the water "wouldn't go down", like there is a constriction there. He called 911, EMT staff advise him to come see the doctor. Also, 2 weeks history of tingling specifically located at the right side of the forehead, went to the ER a couple of days ago, neurological exam was normal. Notes reviewed. He is noted to be tachycardic, admits to a lot of stress lately, his mother's health is not the best and he is very anxious despite taking Lexapro. Hypertension: Good compliance of medication, BP today satisfactory.   Review of Systems  No chest pain or difficulty breathing No nausea, vomiting, blood in the stool or abdominal pain. Has classic heartburn very seldom. No odynophagia. No cough No depression No diplopia, slurred speech or motor deficits. No rash @ the forehead  Past Medical History  Diagnosis Date  . IBS (irritable bowel syndrome)   . Hypertension     "just started RX this past weekend" (06/17/2013)  . Headache(784.0)     "most days recently related to my blood pressure" (06/17/2013)  . Arthritis     "lower back" (06/17/2013)  . Chronic lower back pain     "bulging disc; see chiropractor once/month" (06/17/2013)  . Environmental allergies     "pretty much anything that grows, I'm allergic to it" (06/17/2013)  . Anxiety and depression     admited 06-2013     Past Surgical History  Procedure Laterality Date  . Wisdom tooth extraction  2000's    Social History   Social History  . Marital Status: Single    Spouse Name: N/A  . Number of Children: 0  . Years of Education: N/A   Occupational History  . no working at present    Social History Main Topics  . Smoking  status: Never Smoker   . Smokeless tobacco: Never Used  . Alcohol Use: No     Comment: 06/17/2013 "1-2 drinks/yr"  . Drug Use: No  . Sexual Activity: No   Other Topics Concern  . Not on file   Social History Narrative   No job at present,  Single,  No children.   Lives at mother's house         Medication List       This list is accurate as of: 08/05/15  1:22 PM.  Always use your most recent med list.               diazepam 5 MG tablet  Commonly known as:  VALIUM  Take 0.5-1 tablets (2.5-5 mg total) by mouth every 8 (eight) hours as needed.     escitalopram 20 MG tablet  Commonly known as:  LEXAPRO  Take 1 tablet (20 mg total) by mouth daily.     GARLIC PO  Take 1 capsule by mouth 2 (two) times daily.     losartan 50 MG tablet  Commonly known as:  COZAAR  Take 1 tablet (50 mg total) by mouth daily.     OIL OF OREGANO PO  Take 1 capsule by mouth 2 (two) times daily.     OVER THE COUNTER MEDICATION  Take 1 tablet by mouth 3 (three) times daily before  meals. Digest Spectrum for IBS     OVER THE COUNTER MEDICATION  Take 1 tablet by mouth 3 (three) times daily before meals. HCL with pepsin for IBS     psyllium 58.6 % powder  Commonly known as:  METAMUCIL  Take 1 packet by mouth 3 (three) times daily before meals.     QUERCETIN PO  Take 1 tablet by mouth daily.           Objective:   Physical Exam BP 136/78 mmHg  Pulse 106  Temp(Src) 98.7 F (37.1 C) (Oral)  Ht  (1.676 m)  Wt 238 lb 4 oz (108.069 kg)  BMI 38.47 kg/m2  SpO2 97% General:   Well developed, well nourished . NAD.  HEENT:  Normocephalic . Face symmetric, atraumatic Neck: No thyromegaly or mass Lungs:  CTA B Normal respiratory effort, no intercostal retractions, no accessory muscle use. Heart: RRR,  no murmur.  no pretibial edema bilaterally  Abdomen:  Not distended, soft, non-tender. No rebound or rigidity. No mass,organomegaly Skin: Not pale. Not jaundice Neurologic:  alert  & oriented X3.  Speech normal, gait appropriate for age and unassisted. Gait normal, EOMI. Motor symmetric. Psych--  Cognition and judgment appear intact.  Cooperative with normal attention span and concentration.  Behavior appropriate. No anxious or depressed appearing.    Assessment & Plan:   Tingling at the forehead: Recommend observation  Dysphagia? Trial with OTC omeprazole daily

## 2015-08-05 NOTE — Assessment & Plan Note (Signed)
On losartan, BP today okay. He is tachycardic on exam, EKGs show sinus rhythm, heart rate 97, no acute changes Plan: BMP, CBC, TSH. Continue with medications.

## 2015-08-05 NOTE — Assessment & Plan Note (Addendum)
Currently on Lexapro 15 mg, symptoms increase for the last 2 weeks, his mother's health has deteriorated. Plan: Increase Lexapro to 20 mg,  also we discussed Valium , took it before w/ good results---> New prescription printed, watch for somnolence

## 2015-08-06 NOTE — Addendum Note (Signed)
Addended by: Dorette Grate on: 08/06/2015 04:48 PM   Modules accepted: Orders

## 2015-08-09 ENCOUNTER — Telehealth: Payer: Self-pay | Admitting: *Deleted

## 2015-08-09 ENCOUNTER — Encounter: Payer: Self-pay | Admitting: *Deleted

## 2015-08-09 NOTE — Telephone Encounter (Signed)
Pre-Visit Call completed with patient and chart updated.   Pre-Visit Info documented in Specialty Comments under [employer].    

## 2015-08-10 ENCOUNTER — Encounter: Payer: Self-pay | Admitting: Internal Medicine

## 2015-08-10 ENCOUNTER — Ambulatory Visit (INDEPENDENT_AMBULATORY_CARE_PROVIDER_SITE_OTHER): Payer: BLUE CROSS/BLUE SHIELD | Admitting: Internal Medicine

## 2015-08-10 VITALS — BP 118/76 | HR 106 | Temp 98.0°F | Ht 66.0 in | Wt 236.1 lb

## 2015-08-10 DIAGNOSIS — Z91013 Allergy to seafood: Secondary | ICD-10-CM

## 2015-08-10 DIAGNOSIS — Z0000 Encounter for general adult medical examination without abnormal findings: Secondary | ICD-10-CM | POA: Insufficient documentation

## 2015-08-10 DIAGNOSIS — F419 Anxiety disorder, unspecified: Secondary | ICD-10-CM

## 2015-08-10 DIAGNOSIS — E871 Hypo-osmolality and hyponatremia: Secondary | ICD-10-CM | POA: Diagnosis not present

## 2015-08-10 DIAGNOSIS — R631 Polydipsia: Secondary | ICD-10-CM

## 2015-08-10 DIAGNOSIS — F54 Psychological and behavioral factors associated with disorders or diseases classified elsewhere: Secondary | ICD-10-CM

## 2015-08-10 LAB — LIPID PANEL
CHOLESTEROL: 146 mg/dL (ref 0–200)
HDL: 30 mg/dL — AB (ref >39.00)
LDL Cholesterol: 78 mg/dL (ref 0–99)
NonHDL: 116.23
TRIGLYCERIDES: 190 mg/dL — AB (ref 0.0–149.0)
Total CHOL/HDL Ratio: 5
VLDL: 38 mg/dL (ref 0.0–40.0)

## 2015-08-10 MED ORDER — EPINEPHRINE 0.3 MG/0.3ML IJ SOAJ: 0.3000 mg | Freq: Once | INTRAMUSCULAR | Status: DC

## 2015-08-10 NOTE — Assessment & Plan Note (Signed)
Many years ago used to see an  Proofreader and carry EpiPen. Plan: Continue avoidance and prescribe a EpiPen today.

## 2015-08-10 NOTE — Progress Notes (Signed)
Subjective:    Patient ID: Edwin Vaughan., male    DOB: [birth year], 44 y.o.   MRN: [id number]  DOS:  08/10/2015 Type of visit - description : cpx  Interval history: Has a number of symptoms, see review of systems, description of symptoms is very vague.    Review of Systems Constitutional: No fever. No chills. No unexplained wt changes. Occasionally sweats more than what he would consider normal  HEENT: No dental problems, no ear discharge, no facial swelling, no voice changes. No eye discharge, no eye  redness , no  intolerance to light   Respiratory: No wheezing , occ   difficulty breathing for long time. No cough , no mucus production  Cardiovascular: No CP, no leg swelling , no  Palpitations  GI: History of IBS with predominantly  Nausea,. No blood in the stools. No dysphagia, no odynophagia    Endocrine: No polyphagia, no polyuria , no polydipsia  GU: No dysuria, gross hematuria, difficulty urinating. No urinary urgency, no frequency.  Musculoskeletal: No joint swellings or unusual aches or pains  Skin: No change in the color of the skin, palor , no  Rash  Allergic, immunologic: No environmental allergies , no  food allergies  Neurological:  Has a problem with overeating, after eating excessively heated this is sometimes.  no  syncope. No headaches. No diplopia, no slurred, no slurred speech, no motor deficits, no facial  Numbness  Hematological: No enlarged lymph nodes, no easy bruising , no unusual bleedings  Psychiatry: No suicidal ideas, no hallucinations, no beavior problems, no confusion.  Long history of anxiety, worse lately, his mother is in poor health, also having "weird tingling in the head" and that concerns him.  Past Medical History  Diagnosis Date  . IBS (irritable bowel syndrome)   . Hypertension     "just started RX this past weekend" (06/17/2013)  . Headache(784.0)     "most days recently related to my blood pressure" (06/17/2013)  . Arthritis     "lower back" (06/17/2013)  . Chronic lower back pain     "bulging disc; see chiropractor once/month" (06/17/2013)  . Environmental allergies     "pretty much anything that grows, I'm allergic to it" (06/17/2013)  . Anxiety and depression     admited 06-2013     Past Surgical History  Procedure Laterality Date  . Wisdom tooth extraction  2000's    Social History   Social History  . Marital Status: Single    Spouse Name: N/A  . Number of Children: 0  . Years of Education: N/A   Occupational History  . no working at present    Social History Main Topics  . Smoking status: Never Smoker   . Smokeless tobacco: Never Used  . Alcohol Use: No     Comment: 06/17/2013 "1-2 drinks/yr"  . Drug Use: No  . Sexual Activity: No   Other Topics Concern  . Not on file   Social History Narrative   No job at present,  Single,  No children.   Lives at mother's house    Family History  Problem Relation Age of Onset  . Hypertension Mother   . Stroke Mother   . Colon cancer Father 54  . Prostate cancer Neg Hx   . Diabetes Neg Hx          Medication List       This list is accurate as of: 08/10/15  9:35 AM.  Always use your most  recent med list.               diazepam 5 MG tablet  Commonly known as:  VALIUM  Take 0.5-1 tablets (2.5-5 mg total) by mouth every 8 (eight) hours as needed.     escitalopram 20 MG tablet  Commonly known as:  LEXAPRO  Take 1 tablet (20 mg total) by mouth daily.     GARLIC PO  Take 1 capsule by mouth 2 (two) times daily.     losartan 50 MG tablet  Commonly known as:  COZAAR  Take 1 tablet (50 mg total) by mouth daily.     OIL OF OREGANO PO  Take 1 capsule by mouth 2 (two) times daily.     OVER THE COUNTER MEDICATION  Take 1 tablet by mouth 3 (three) times daily before meals. Digest Spectrum for IBS     OVER THE COUNTER MEDICATION  Take 1 tablet by mouth 3 (three) times daily before meals. HCL with pepsin for IBS     psyllium 58.6 % powder    Commonly known as:  METAMUCIL  Take 1 packet by mouth 3 (three) times daily before meals.     QUERCETIN PO  Take 1 tablet by mouth daily.           Objective:   Physical Exam BP 118/76 mmHg  Pulse 106  Temp(Src) 98 F (36.7 C) (Oral)  Ht  (1.676 m)  Wt 236 lb 2 oz (107.106 kg)  BMI 38.13 kg/m2  SpO2 96% General:   Well developed, well nourished . NAD.  HEENT:  Normocephalic . Face symmetric, atraumatic Neck: No thyromegaly Lungs:  CTA B Normal respiratory effort, no intercostal retractions, no accessory muscle use. Heart: RRR,  no murmur.  no pretibial edema bilaterally  Abdomen:  Not distended, soft, non-tender. No rebound or rigidity. No mass,organomegaly Skin: Not pale. Not jaundice Neurologic:  alert & oriented X3.  Speech normal, gait appropriate for age and unassisted Psych--  Cognition and judgment appear intact.  Cooperative with normal attention span and concentration.  Behavior appropriate. slt anxious but no depressed appearing.     Assessment & Plan:

## 2015-08-10 NOTE — Assessment & Plan Note (Addendum)
Symptoms are not well controlled at this point, increase anxiety apparently triggered by his mother's health. 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 History of psychiatric admission 2014. Plan: Continue with Lexapro, strongly recommend to see a psychiatrist.

## 2015-08-10 NOTE — Patient Instructions (Signed)
Please see a psychiatrist and a Veterinary surgeon.  Come back in 3 months for a routine visit

## 2015-08-10 NOTE — Assessment & Plan Note (Addendum)
Tdap 2015  CCS: + FH, reports 3 cscopes, last 2014, normal, next per GI Prostate cancer screening-- not indicated  Diet and exercise: He has an eating disorder, reports he is addicted to fast food, we are recommending psychiatry eval. See comments under anxiety. Check FLP

## 2015-08-10 NOTE — Assessment & Plan Note (Signed)
He was admitted to the hospital with anxiety in 2014, at the time she had hyponatremia and admitted to drink plenty of fluid. Recent sodium has been low, he is not on diuretics, we are checking labs today.

## 2015-08-10 NOTE — Progress Notes (Signed)
Pre visit review using our clinic review tool, if applicable. No additional management support is needed unless otherwise documented below in the visit note. 

## 2015-08-11 LAB — SODIUM, URINE, RANDOM: SODIUM UR: 47 mmol/L (ref 28–272)

## 2015-08-11 LAB — OSMOLALITY: OSMOLALITY: 278 mosm/kg (ref 275–300)

## 2015-08-13 ENCOUNTER — Telehealth: Payer: Self-pay

## 2015-08-13 NOTE — Addendum Note (Signed)
Addended by: [name] E on: 08/13/2015 09:27 AM   Modules accepted: Kipp Brood

## 2015-08-13 NOTE — Telephone Encounter (Signed)
UDS: 08/05/2015  Negative for Diazepam: okay Pt just started medication Positive for Citalopram  Low risk per Dr. Drue Novel 08/13/2015

## 2015-09-17 ENCOUNTER — Other Ambulatory Visit: Payer: Self-pay | Admitting: Internal Medicine

## 2015-09-30 ENCOUNTER — Other Ambulatory Visit: Payer: Self-pay | Admitting: Family Medicine

## 2015-09-30 DIAGNOSIS — R209 Unspecified disturbances of skin sensation: Secondary | ICD-10-CM

## 2015-10-10 ENCOUNTER — Other Ambulatory Visit: Payer: BLUE CROSS/BLUE SHIELD

## 2015-10-16 ENCOUNTER — Ambulatory Visit
Admission: RE | Admit: 2015-10-16 | Discharge: 2015-10-16 | Disposition: A | Discharge status: Home / Self Care | Payer: BLUE CROSS/BLUE SHIELD | Source: Ambulatory Visit | Attending: Family Medicine | Admitting: Family Medicine

## 2015-10-16 DIAGNOSIS — R209 Unspecified disturbances of skin sensation: Secondary | ICD-10-CM

## 2015-11-19 ENCOUNTER — Other Ambulatory Visit: Payer: Self-pay | Admitting: Internal Medicine

## 2015-12-20 ENCOUNTER — Other Ambulatory Visit: Payer: Self-pay | Admitting: Internal Medicine

## 2016-01-05 ENCOUNTER — Telehealth: Payer: Self-pay | Admitting: Internal Medicine

## 2016-01-06 MED ORDER — LOSARTAN POTASSIUM 50 MG PO TABS: 50.0000 mg | ORAL_TABLET | Freq: Every day | ORAL | Status: DC

## 2016-01-06 NOTE — Telephone Encounter (Signed)
Pt scheduled appt for 01/11/16 - he is out of losartan since yesterday - requested enough til he comes in

## 2016-01-06 NOTE — Telephone Encounter (Signed)
Rx sent 

## 2016-01-06 NOTE — Addendum Note (Signed)
Addended by: Conrad Elvaston D on: 01/06/2016 01:03 PM   Modules accepted: Orders, Medications

## 2016-01-11 ENCOUNTER — Encounter: Payer: Self-pay | Admitting: Internal Medicine

## 2016-01-11 ENCOUNTER — Ambulatory Visit (INDEPENDENT_AMBULATORY_CARE_PROVIDER_SITE_OTHER): Payer: BLUE CROSS/BLUE SHIELD | Admitting: Internal Medicine

## 2016-01-11 VITALS — BP 116/74 | HR 87 | Temp 98.1°F | Ht 66.0 in | Wt 233.2 lb

## 2016-01-11 DIAGNOSIS — F419 Anxiety disorder, unspecified: Secondary | ICD-10-CM | POA: Diagnosis not present

## 2016-01-11 DIAGNOSIS — I6320 Cerebral infarction due to unspecified occlusion or stenosis of unspecified precerebral arteries: Secondary | ICD-10-CM

## 2016-01-11 DIAGNOSIS — I10 Essential (primary) hypertension: Secondary | ICD-10-CM | POA: Diagnosis not present

## 2016-01-11 LAB — BASIC METABOLIC PANEL
BUN: 10 mg/dL (ref 6–23)
CALCIUM: 9.6 mg/dL (ref 8.4–10.5)
CO2: 26 mEq/L (ref 19–32)
Chloride: 98 mEq/L (ref 96–112)
Creatinine, Ser: 0.82 mg/dL (ref 0.40–1.50)
GFR: 108.81 mL/min (ref >60.00)
GLUCOSE: 123 mg/dL — AB (ref 70–99)
Potassium: 4.3 mEq/L (ref 3.5–5.1)
SODIUM: 132 meq/L — AB (ref 135–145)

## 2016-01-11 NOTE — Progress Notes (Signed)
Subjective:    Patient ID: Edwin Vaughan., male    DOB: 07/31/1971, 44 y.o.   MRN: [id number]  DOS:  01/11/2016 Type of visit - description : Routine checkup Interval history: HTN: goos compliance of medication, no recent ambulatory BPs. Stroke? Shortly after the last time I saw him 8- 2016, felt like "I had another stroke: Words were not as crisp and I felt emotionally different".  Went to see another doctor ~ November, they prescribe a MRI, couldn't proceed due to   claustrophobia but had a CT of the head  was negative per pt. Still very concerned about a stroke, would like another opinion Anxiety depression: Self discontinue Lexapro  several months ago as he was feeling emotionally flat and was concerned about a stroke. Since then he continue to be under a lot of stress, mild depression. Loss his mother few weeks ago.    Review of Systems When asked, admits to occasional suicidal thoughts from time to time, not in the last few days, symptoms are not intense. No suicidal plans.  Past Medical History  Diagnosis Date  . IBS (irritable bowel syndrome)   . Hypertension     "just started RX this past weekend" (06/17/2013)  . Headache(784.0)     "most days recently related to my blood pressure" (06/17/2013)  . Arthritis     "lower back" (06/17/2013)  . Chronic lower back pain     "bulging disc; see chiropractor once/month" (06/17/2013)  . Environmental allergies     "pretty much anything that grows, I'm allergic to it" (06/17/2013)  . Anxiety and depression     admited 06-2013   . 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 Wisdom tooth extraction  2000's    Social History   Social History  . Marital Status: Single    Spouse Name: N/A  . Number of Children: 0  . Years of Education: N/A   Occupational History  . no working at present    Social History Main Topics  . Smoking status: Never Smoker   . Smokeless tobacco: Never Used  .  Alcohol Use: No     Comment: 06/17/2013 "1-2 drinks/yr"  . Drug Use: No  . Sexual Activity: No   Other Topics Concern  . Not on file   Social History Narrative   No job at present,  Single,  No children.   Lives at mother's house , lost mother Edwin Vaughan 12-2015        Medication List       This list is accurate as of: 01/11/16 11:59 PM.  Always use your most recent med list.               EPINEPHrine 0.3 mg/0.3 mL Soaj injection  Commonly known as:  EPIPEN 2-PAK  Inject 0.3 mLs (0.3 mg total) into the muscle once.     GARLIC PO  Take 1 capsule by mouth 2 (two) times daily.     losartan 50 MG tablet  Commonly known as:  COZAAR  Take 1 tablet (50 mg total) by mouth daily.     OIL OF OREGANO PO  Take 1 capsule by mouth 2 (two) times daily.     OVER THE COUNTER MEDICATION  Take 1 tablet by mouth 3 (three) times daily before meals. Digest Spectrum for IBS     OVER THE COUNTER MEDICATION  Take 1 tablet by mouth 3 (three) times daily before  meals. HCL with pepsin for IBS     psyllium 58.6 % powder  Commonly known as:  METAMUCIL  Take 1 packet by mouth 3 (three) times daily before meals.     QUERCETIN PO  Take 1 tablet by mouth daily. Reported on 01/11/2016           Objective:   Physical Exam BP 116/74 mmHg  Pulse 87  Temp(Src) 98.1 F (36.7 C) (Oral)  Ht  (1.676 m)  Wt 233 lb 4 oz (105.802 kg)  BMI 37.67 kg/m2  SpO2 97% General:   Well developed, well nourished . NAD.  HEENT:  Normocephalic . Face symmetric, atraumatic Lungs:  CTA B Normal respiratory effort, no intercostal retractions, no accessory muscle use. Heart: RRR,  no murmur.  No pretibial edema bilaterally  Skin: Not pale. Not jaundice Neurologic:  alert & oriented X3.  Speech normal, gait appropriate for age and unassisted. EOMI, motor and DTRs symmetric. Psych--  Cognition and judgment appear intact.  Cooperative with normal attention span and concentration.  Behavior  appropriate. Slightly apprehensive but no  anxious or depressed appearing.      Assessment & Plan:   Assessment  HTN Anxiety depression,  insomnia--- admitted 2014 Binge eating d/o Psychogenic polydipsia  IBS Chronic headaches MSK: Chronic back pain Shellfish Allergy Stroke?  Patient concerned about a stroke in 2014  >> MRI (-) saw neuro (no issues found) Concerned again ~07-2015, CT head neg    PLAN: HTN: cont Losartan, no ambulatory BPs. BP today is very good, history of hyponatremia. Check a BMP. Anxiety, depression, insomnia:  Currently on no medications, occasional suicidal thoughts for the last several months. Denies plans to me. Given multiple psych issues I strongly recommend him to be under the care of a psychiatrist care. he is in the process to be seen at another office. Stroke? Back in 2014 at the same concerns, but MRI was negative, he saw neurology and not issue was found. Again concerned about a stroke since August 2016, neurological exam is normal, he likes another opinion and get an MRI. Plan: Refer to neurology for a second opinion although the concern probably reflects his general anxiety RTC CPX 07-2016

## 2016-01-11 NOTE — Patient Instructions (Signed)
BEFORE YOU LEAVE THE OFFICE:  GO TO THE LAB  Get the blood work    GO TO THE FRONT DESK Schedule a complete physical exam to be done by 07-2016  Please be fasting   

## 2016-01-11 NOTE — Progress Notes (Signed)
Pre visit review using our clinic review tool, if applicable. No additional management support is needed unless otherwise documented below in the visit note. 

## 2016-01-17 ENCOUNTER — Encounter: Payer: Self-pay | Admitting: Neurology

## 2016-01-17 ENCOUNTER — Ambulatory Visit (INDEPENDENT_AMBULATORY_CARE_PROVIDER_SITE_OTHER): Payer: BLUE CROSS/BLUE SHIELD | Admitting: Neurology

## 2016-01-17 VITALS — BP 139/94 | HR 80 | Ht 65.0 in | Wt 230.8 lb

## 2016-01-17 DIAGNOSIS — N509 Disorder of male genital organs, unspecified: Secondary | ICD-10-CM | POA: Diagnosis not present

## 2016-01-17 DIAGNOSIS — F411 Generalized anxiety disorder: Secondary | ICD-10-CM

## 2016-01-17 DIAGNOSIS — R531 Weakness: Secondary | ICD-10-CM | POA: Insufficient documentation

## 2016-01-17 DIAGNOSIS — H538 Other visual disturbances: Secondary | ICD-10-CM

## 2016-01-17 DIAGNOSIS — R1310 Dysphagia, unspecified: Secondary | ICD-10-CM | POA: Diagnosis not present

## 2016-01-17 DIAGNOSIS — F329 Major depressive disorder, single episode, unspecified: Secondary | ICD-10-CM

## 2016-01-17 DIAGNOSIS — R4789 Other speech disturbances: Secondary | ICD-10-CM | POA: Diagnosis not present

## 2016-01-17 DIAGNOSIS — N5089 Other specified disorders of the male genital organs: Secondary | ICD-10-CM

## 2016-01-17 DIAGNOSIS — M6289 Other specified disorders of muscle: Secondary | ICD-10-CM

## 2016-01-17 DIAGNOSIS — F32A Depression, unspecified: Secondary | ICD-10-CM

## 2016-01-17 MED ORDER — ALPRAZOLAM 0.5 MG PO TABS: 0.5000 mg | ORAL_TABLET | Freq: Once | ORAL | Status: DC

## 2016-01-17 NOTE — Progress Notes (Signed)
[facility]    Provider:  Dr Edwin Vaughan Referring Provider: Wanda Plump, MD Primary Care Physician:  Edwin Ora, MD  CC:  Possible stroke  HPI:  Edwin Vaughan. is a 45 y.o. male here as a referral from Dr. Drue Vaughan for possible stroke in the setting of increased stress. He has a past medical history of irritable bowel syndrome, hypertension, headache, arthritis, chronic lower back pain, anxiety and depression, psychogenic polydipsia. He has been under a lot of stress. His mother just passed away. He was eating bad food every day. He woke up one morning in August 2016 and felt emotionally different. Felt part of his brain was no feeling right. Since then he has had ongoing symptoms. He has IBS and he has not worked in 5 years. He is alone a lot, he doesn't get out a lot. He is having a hard time finding his words. Not pronouncing his S's like he should. This past weekend he got [place] food and he has been feeling miserable ever since. He has pain on the right side of the brain, he has sharp pain on the right side of the brain occasionally (points to the parietal area). This has resolved for the most part. He didn;t feel as responsive to the reflex test on the left last week at his doctor's office and has felt heavier and weaker on the left side ever since then. The past week he is sleeping a lot, sluggish exhausted. Hs is having trouble swallowing a lot, not choking but felt like he had crumbs in the back of his throat even after he drinks liquid. Foggy thinking. Symptoms are progressive in the last week and a half. Saturday [place] and [place] of the head was negative. He went off of the Lexapro last August as he felt like he had a stroke. No other focal neurologic symptoms or systemic symptoms.  Reviewed notes, labs and imaging from outside physicians, which showed: per Dr. Drue Vaughan notes "Anxiety depression: Self discontinue Lexapro several months ago as he was feeling emotionally  flat and was concerned about a stroke. Since then he continue to be under a lot of stress, mild depression. Loss his mother few weeks ago.".  MRI of the brain in July 2014 (personally reviewed images and agree with the following): Essentially normal MRI of the brain. Few punctate white matter foci not likely to be of any significance. No cause of headache and dizziness is identified.  Reviewed notes from [facility] healthcare, 01/15/2016 visit. He was complaining of fatigue, dizziness and left-sided weakness that started 1 week before. He reported a few months previous in August 2016 he was under a lot of stress and felt like he had a mini stroke. CT scan his primary care was negative. Patient tried an MRI but he was unable due to claustrophobia. Patient reports not thinking clearly this is worsened when heating foods high in sodium. Patient also reported an emotional change, being forgetful, stumbling around, blurred vision, nausea and tingling in his left arm for one day. He was referred to neurology.  Labs on February 2017 included unremarkable CBC, BMP with low chloride at 91, creatinine 0.68 and low sodium at 132.  CT of the head was negative without contrast. No intra-or extra-axial fluid collection or mass lesion. Basilar cisterns and ventricles have a normal appearance. No CT evidence for acute infarction or hemorrhage.   Review of Systems: Patient complains of symptoms per HPI as well as the  following symptoms: fatigue, blurred vision, swelling, hearing loss, allergies, memory loss, confusion, headache, weakness, difficulty swallowing, depression, decreased energy. Pertinent negatives per HPI. All others negative.   Social History   Social History  . Marital Status: Single    Spouse Name: N/A  . Number of Children: 0  . Years of Education: 12+   Occupational History  . Unemployed    Social History Main Topics  . Smoking status: Never Smoker   . Smokeless tobacco: Never Used  .  Alcohol Use: No     Comment: 06/17/2013 "1-2 drinks/yr"  . Drug Use: No  . Sexual Activity: No   Other Topics Concern  . Not on file   Social History Narrative   No job at present,  Single,  No children.   Lives at mother's house , lost mother Edwin Vaughan 12-2015      Caffeine use: Coffee.tea- rare    Family History  Problem Relation Age of Onset  . Hypertension Mother   . Stroke Mother   . Colon cancer Father 24  . Prostate cancer Neg Hx   . Diabetes Neg Hx     Past Medical History  Diagnosis Date  . IBS (irritable bowel syndrome)   . Hypertension     "just started RX this past weekend" (06/17/2013)  . Headache(784.0)     "most days recently related to my blood pressure" (06/17/2013)  . Arthritis     "lower back" (06/17/2013)  . Chronic lower back pain     "bulging disc; see chiropractor once/month" (06/17/2013)  . Environmental allergies     "pretty much anything that grows, I'm allergic to it" (06/17/2013)  . Anxiety and depression     admited 06-2013   . 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 Wisdom tooth extraction  2000's    Current Outpatient Prescriptions  Medication Sig Dispense Refill  . GARLIC PO Take 1 capsule by mouth 2 (two) times daily.    Marland Kitchen losartan (COZAAR) 50 MG tablet Take 1 tablet (50 mg total) by mouth daily. 30 tablet 0  . OIL OF OREGANO PO Take 1 capsule by mouth 2 (two) times daily.    Marland Kitchen OVER THE COUNTER MEDICATION Take 1 tablet by mouth 3 (three) times daily before meals. Digest Spectrum for IBS    . OVER THE COUNTER MEDICATION Take 1 tablet by mouth 3 (three) times daily before meals. HCL with pepsin for IBS    . psyllium (METAMUCIL) 58.6 % powder Take 1 packet by mouth 3 (three) times daily before meals.     Marland Kitchen QUERCETIN PO Take 1 tablet by mouth daily. Reported on 01/11/2016    . ALPRAZolam (XANAX) 0.5 MG tablet Take 1 tablet (0.5 mg total) by mouth once. Take 1-2 tabs 30 minutes before MRI. May repeat  while in MRI. Do not drive, someone must bring you. 3 tablet 0  . EPINEPHrine (EPIPEN 2-PAK) 0.3 mg/0.3 mL IJ SOAJ injection Inject 0.3 mLs (0.3 mg total) into the muscle once. (Patient not taking: Reported on 01/11/2016) 1 Device 1   No current facility-administered medications for this visit.    Allergies as of 01/17/2016 - Review Complete 01/17/2016  Allergen Reaction Noted  . Shellfish allergy Anaphylaxis 06/17/2013    Vitals: BP 139/94 mmHg  Pulse 80  Ht 5\' 5"  (1.651 m)  Wt 230 lb 12.8 oz (104.69 kg)  BMI 38.41 kg/m2 Last Weight:  Wt Readings from Last 1 Encounters:  01/17/16 230 lb 12.8 oz (104.69 kg)   Last Height:   Ht Readings from Last 1 Encounters:  01/17/16  (1.651 m)   Physical exam: Exam: Gen: NAD, conversant, well nourised, obese, well groomed                     CV: RRR, no MRG. No Carotid Bruits. No peripheral edema, warm, nontender Eyes: Conjunctivae clear without exudates or hemorrhage  Neuro: Detailed Neurologic Exam  Speech:    Speech is normal; fluent and spontaneous with normal comprehension.  Cognition:    The patient is oriented to person, place, and time;     recent and remote memory intact;     language fluent;     normal attention, concentration,     fund of knowledge Cranial Nerves:    The pupils are equal, round, and reactive to light. The fundi are normal and spontaneous venous pulsations are present. Visual fields are full to finger confrontation. Extraocular movements are intact. Trigeminal sensation is intact and the muscles of mastication are normal. The face is symmetric. The palate elevates in the midline. Hearing intact. Voice is normal. Shoulder shrug is normal. The tongue has normal motion without fasciculations.   Coordination:    Normal finger to nose and heel to shin. Normal rapid alternating movements.   Gait:    Heel-toe and tandem gait are normal.   Motor Observation:    No asymmetry, no atrophy, and no involuntary  movements noted. Tone:    Normal muscle tone.    Posture:    Posture is normal. normal erect    Strength:    Strength is V/V in the upper and lower limbs.      Sensation: intact to LT     Reflex Exam:  DTR's:    Deep tendon reflexes in the upper and lower extremities are normal bilaterally.   Toes:    The toes are downgoing bilaterally.   Clonus:    Clonus is absent.       Assessment/Plan:  45 year old patient with multiple complaints in the setting of stress included left sided weakness and sensory changes, dizziness, difficulty finding his words, fatigue, right sided brain pain, exhaustion. He felt as though he had a mini stroke in August 2016 when all the symptoms started. Symptoms worsening over the last several weeks. Was seen at [facility] urgent care with a negative CT of the head. Neuro exam is unremarkable. I suspect there is a large contribution to his symptoms from psychiatric causes but need to rule out intracranial abnormalities with MRI of the brain. We'll order MRI of the brain open due to claustrophobia.  Naomie Dean, MD  [facility] [address] [place], [place] [address]  Phone [phone] Fax [phone]

## 2016-01-17 NOTE — Patient Instructions (Signed)
Remember to drink plenty of fluid, eat healthy meals and do not skip any meals. Try to eat protein with a every meal and eat a healthy snack such as fruit or nuts in between meals. Try to keep a regular sleep-wake schedule and try to exercise daily, particularly in the form of walking, 20-30 minutes a day, if you can.   As far as diagnostic testing: mri brain  Our phone number is [phone]. We also have an after hours call service for urgent matters and there is a physician on-call for urgent questions. For any emergencies you know to call 911 or go to the nearest emergency room

## 2016-01-18 DIAGNOSIS — F411 Generalized anxiety disorder: Secondary | ICD-10-CM | POA: Insufficient documentation

## 2016-01-20 ENCOUNTER — Telehealth: Payer: Self-pay | Admitting: *Deleted

## 2016-01-20 NOTE — Telephone Encounter (Signed)
Records from [facility] on [place] desk.

## 2016-01-25 ENCOUNTER — Other Ambulatory Visit: Payer: BLUE CROSS/BLUE SHIELD

## 2016-01-27 ENCOUNTER — Inpatient Hospital Stay: Admission: RE | Admit: 2016-01-27 | Payer: BLUE CROSS/BLUE SHIELD | Source: Ambulatory Visit

## 2016-02-03 ENCOUNTER — Telehealth: Payer: Self-pay | Admitting: Neurology

## 2016-02-03 DIAGNOSIS — R4789 Other speech disturbances: Secondary | ICD-10-CM

## 2016-02-03 DIAGNOSIS — H538 Other visual disturbances: Secondary | ICD-10-CM

## 2016-02-03 DIAGNOSIS — R531 Weakness: Secondary | ICD-10-CM

## 2016-02-03 DIAGNOSIS — R1310 Dysphagia, unspecified: Secondary | ICD-10-CM

## 2016-02-03 DIAGNOSIS — N5089 Other specified disorders of the male genital organs: Secondary | ICD-10-CM

## 2016-02-03 NOTE — Telephone Encounter (Signed)
Tried calling pt back. Got a busy signal. Will try again later.

## 2016-02-03 NOTE — Telephone Encounter (Signed)
Pt called and was unable to complete MRI. Pt is extremly claustrophobic and needs to be sent to hospital to have monitored sedation. Please call and advise [phone]

## 2016-02-04 ENCOUNTER — Telehealth: Payer: Self-pay

## 2016-02-04 ENCOUNTER — Ambulatory Visit (INDEPENDENT_AMBULATORY_CARE_PROVIDER_SITE_OTHER): Payer: BLUE CROSS/BLUE SHIELD | Admitting: Internal Medicine

## 2016-02-04 ENCOUNTER — Encounter: Payer: Self-pay | Admitting: Internal Medicine

## 2016-02-04 VITALS — BP 126/76 | HR 88 | Temp 98.5°F | Ht 65.0 in | Wt 213.5 lb

## 2016-02-04 DIAGNOSIS — R739 Hyperglycemia, unspecified: Secondary | ICD-10-CM | POA: Diagnosis not present

## 2016-02-04 DIAGNOSIS — F329 Major depressive disorder, single episode, unspecified: Secondary | ICD-10-CM

## 2016-02-04 DIAGNOSIS — F418 Other specified anxiety disorders: Secondary | ICD-10-CM

## 2016-02-04 DIAGNOSIS — R0683 Snoring: Secondary | ICD-10-CM

## 2016-02-04 DIAGNOSIS — R1310 Dysphagia, unspecified: Secondary | ICD-10-CM | POA: Diagnosis not present

## 2016-02-04 DIAGNOSIS — F419 Anxiety disorder, unspecified: Principal | ICD-10-CM

## 2016-02-04 DIAGNOSIS — F32A Depression, unspecified: Secondary | ICD-10-CM

## 2016-02-04 DIAGNOSIS — Z09 Encounter for follow-up examination after completed treatment for conditions other than malignant neoplasm: Secondary | ICD-10-CM

## 2016-02-04 LAB — HEMOGLOBIN A1C
Hgb A1c MFr Bld: 5.8 % — ABNORMAL HIGH (ref <5.7)
Mean Plasma Glucose: 120 mg/dL — ABNORMAL HIGH (ref <117)

## 2016-02-04 MED ORDER — PANTOPRAZOLE SODIUM 40 MG PO TBEC: 40.0000 mg | DELAYED_RELEASE_TABLET | Freq: Every day | ORAL | Status: DC

## 2016-02-04 NOTE — Patient Instructions (Addendum)
GO TO THE LAB : Get the blood work    GO TO THE FRONT DESK  Schedule a routine office visit or check up to be done in  3 months  No fasting   Start on medication called pantoprazole, take 1 tablet every morning before breakfast

## 2016-02-04 NOTE — Progress Notes (Signed)
Pre visit review using our clinic review tool, if applicable. No additional management support is needed unless otherwise documented below in the visit note. 

## 2016-02-04 NOTE — Telephone Encounter (Signed)
DPR completed and faxed to [facility] Records Dept at [phone]. Received fax confirmation on 02/04/2016 at 1430. Awaiting medical records.

## 2016-02-04 NOTE — Progress Notes (Signed)
Subjective:    Patient ID: Edwin Vaughan., male    DOB: 10/31/1971, 45 y.o.   MRN: [id number]  DOS:  02/04/2016 Type of visit - description : Acute visit Interval history: Patient went to the ER twice this week, he developed mild headache, left-sided and right-sided weakness, states that he knows something is going on and he  likely had a stroke. At  the ER he was examining and blood work was done, they decided not to proceed with a CT of the head because he had an open MRI of the brain is scheduled for 02/03/2016. (No records available @ [facility],  sent a request to get records) Went yesterday to get the MRI, couldn't proceed due to claustrophobia and states is working with his neurologist to get that rescheduled.  Also, reports dysphagia and occasional odynophagia. States that since the fall 2016 when he thinks he had a stroke having on and off symptoms of difficulty swallowing >>>  problem resurface a couple of days ago.  A lot of stress, lost his mother not long ago, reports that he has not been taking care of his house or cleaning it. Has noted mold in his property  He also is concerned about sleep apnea, he snores loudly and sometimes wake himself up. Reports fatigue for many years.   Review of Systems  Denies heartburn per se. No suicidal ideas   Past Medical History  Diagnosis Date  . IBS (irritable bowel syndrome)   . Hypertension     "just started RX this past weekend" (06/17/2013)  . Headache(784.0)     "most days recently related to my blood pressure" (06/17/2013)  . Arthritis     "lower back" (06/17/2013)  . Chronic lower back pain     "bulging disc; see chiropractor once/month" (06/17/2013)  . Environmental allergies     "pretty much anything that grows, I'm allergic to it" (06/17/2013)  . Anxiety and depression     admited 06-2013   . 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 Wisdom tooth extraction  2000's     Social History   Social History  . Marital Status: Single    Spouse Name: N/A  . Number of Children: 0  . Years of Education: 12+   Occupational History  . Unemployed    Social History Main Topics  . Smoking status: Never Smoker   . Smokeless tobacco: Never Used  . Alcohol Use: No     Comment: 06/17/2013 "1-2 drinks/yr"  . Drug Use: No  . Sexual Activity: No   Other Topics Concern  . Not on file   Social History Narrative   No job at present,  Single,  No children.   Lives at mother's house , lost mother [name] 12-2015      Caffeine use: Coffee.tea- rare        Medication List       This list is accurate as of: 02/04/16 11:59 PM.  Always use your most recent med list.               ALPRAZolam 0.5 MG tablet  Commonly known as:  XANAX  Take 1 tablet (0.5 mg total) by mouth once. Take 1-2 tabs 30 minutes before MRI. May repeat while in MRI. Do not drive, someone must bring you.     EPINEPHrine 0.3 mg/0.3 mL Soaj injection  Commonly known as:  EPIPEN 2-PAK  Inject 0.3 mLs (0.3  mg total) into the muscle once.     GARLIC PO  Take 1 capsule by mouth 2 (two) times daily. Reported on 02/04/2016     losartan 50 MG tablet  Commonly known as:  COZAAR  Take 1 tablet (50 mg total) by mouth daily.     OIL OF OREGANO PO  Take 1 capsule by mouth 2 (two) times daily. Reported on 02/04/2016     OVER THE COUNTER MEDICATION  Take 1 tablet by mouth 3 (three) times daily before meals. Reported on 02/04/2016     OVER THE COUNTER MEDICATION  Take 1 tablet by mouth 3 (three) times daily before meals. Reported on 02/04/2016     pantoprazole 40 MG tablet  Commonly known as:  PROTONIX  Take 1 tablet (40 mg total) by mouth daily.     psyllium 58.6 % powder  Commonly known as:  METAMUCIL  Take 1 packet by mouth 3 (three) times daily before meals. Reported on 02/04/2016     QUERCETIN PO  Take 1 tablet by mouth daily. Reported on 02/04/2016           Objective:   Physical  Exam BP 126/76 mmHg  Pulse 88  Temp(Src) 98.5 F (36.9 C) (Oral)  Ht  (1.651 m)  Wt 213 lb 8 oz (96.843 kg)  BMI 35.53 kg/m2  SpO2 97% General:   Well developed, well nourished . NAD.  HEENT:  Normocephalic . Face symmetric, atraumatic. Neck: Symmetric, no LAD is. Lungs:  CTA B Normal respiratory effort, no intercostal retractions, no accessory muscle use. Heart: RRR,  no murmur.  No pretibial edema bilaterally  Skin: Not pale. Not jaundice Neurologic:  alert & oriented X3.  Speech normal, gait appropriate for age and unassisted. Motor symmetric. Sternocleidomastoid movements intact. Psych--  Cognition and judgment appear intact.  Cooperative with normal attention span and concentration.  Behavior appropriate. +anxious but no depressed appearing.      Assessment & Plan:   Assessment  HTN Anxiety depression,  insomnia--- admitted 2014 Binge eating d/o Psychogenic polydipsia  IBS Chronic headaches MSK: Chronic back pain Shellfish Allergy Stroke?  concerned about a stroke in 2014  >> MRI (-) saw neuro (no issues found) Concerned again ~07-2015, CT head neg ; saw neuro 01-17-2016 , sx felt to be psych, rx a MRI   PLAN: Anxiety, depression, insomnia:  went to the ER twice this week thinking he had a stroke, neuro exam today is at baseline, I believe his main problem is emotional/psychological. I encouraged him to seek help from psychiatrist as previously discussed, he agrees with me but is extremely concerned about his physical symptoms such as dysphagia and possibly stroke. He apparently is developing hoarding behavior (see HPI) To be sure and be able to reassure the patient, I will prescribe PPIs for the dysphagia  (? GERD) and rx a barium swallow. He will proceed with the brain  MRI, he states contactes neurology already  to get that rescheduled We'll check a A1c  Also he  raises concerns about sleep apnea, he does snore and is fatigue. Will refer for formal  sleep study.  At the end of the visit he told me he needs immediate help with the difficulty swallowing, he also said he is short of breath, on exam there is no physical distress, VSS, i reassured him to the best of my ability and asked him to call if something changes   RTC 3 months

## 2016-02-04 NOTE — Telephone Encounter (Signed)
Called pt and relayed message that MRI brain under sedation ok per Dr Lucia Gaskins. I placed order. Someone will be calling him to schedule MRI. Told him to call if he does not hear next week about scheduling. He verbalized understanding.

## 2016-02-04 NOTE — Telephone Encounter (Signed)
Fine with me please order it thanks

## 2016-02-04 NOTE — Telephone Encounter (Signed)
Dr Ahern- are you ok with this? 

## 2016-02-06 DIAGNOSIS — Z09 Encounter for follow-up examination after completed treatment for conditions other than malignant neoplasm: Secondary | ICD-10-CM | POA: Insufficient documentation

## 2016-02-06 NOTE — Assessment & Plan Note (Signed)
Anxiety, depression, insomnia:  went to the ER twice this week thinking he had a stroke, neuro exam today is at baseline, I believe his main problem is emotional/psychological. I encouraged him to seek help from psychiatrist as previously discussed, he agrees with me but is extremely concerned about his physical symptoms such as dysphagia and possibly stroke. He apparently is developing hoarding behavior (see HPI) To be sure and be able to reassure the patient, I will prescribe PPIs for the dysphagia  (? GERD) and rx a barium swallow. He will proceed with the brain  MRI, he states contactes neurology already  to get that rescheduled We'll check a A1c  Also he  raises concerns about sleep apnea, he does snore and is fatigue. Will refer for formal sleep study.  At the end of the visit he told me he needs immediate help with the difficulty swallowing, he also said he is short of breath, on exam there is no physical distress, VSS, i reassured him to the best of my ability and asked him to call if something changes   RTC 3 months

## 2016-02-07 ENCOUNTER — Other Ambulatory Visit: Payer: Self-pay

## 2016-02-07 MED ORDER — LOSARTAN POTASSIUM 50 MG PO TABS: 50.0000 mg | ORAL_TABLET | Freq: Every day | ORAL | Status: DC

## 2016-02-28 ENCOUNTER — Institutional Professional Consult (permissible substitution): Payer: BLUE CROSS/BLUE SHIELD | Admitting: Neurology

## 2016-02-28 ENCOUNTER — Other Ambulatory Visit (HOSPITAL_COMMUNITY): Payer: BLUE CROSS/BLUE SHIELD

## 2016-02-28 ENCOUNTER — Telehealth: Payer: Self-pay

## 2016-02-28 NOTE — Telephone Encounter (Signed)
Pt did not show for their appt with Dr. Dohmeier today.  

## 2016-02-29 ENCOUNTER — Encounter: Payer: Self-pay | Admitting: Neurology

## 2016-03-01 ENCOUNTER — Encounter: Payer: Self-pay | Admitting: Nurse Practitioner

## 2016-03-01 ENCOUNTER — Ambulatory Visit (INDEPENDENT_AMBULATORY_CARE_PROVIDER_SITE_OTHER): Payer: BLUE CROSS/BLUE SHIELD | Admitting: Nurse Practitioner

## 2016-03-01 ENCOUNTER — Encounter (HOSPITAL_COMMUNITY)
Admission: RE | Admit: 2016-03-01 | Discharge: 2016-03-01 | Disposition: A | Discharge status: Home / Self Care | Payer: BLUE CROSS/BLUE SHIELD | Source: Ambulatory Visit | Attending: Neurology | Admitting: Neurology

## 2016-03-01 ENCOUNTER — Telehealth: Payer: Self-pay | Admitting: Neurology

## 2016-03-01 ENCOUNTER — Telehealth: Payer: Self-pay | Admitting: *Deleted

## 2016-03-01 ENCOUNTER — Encounter (HOSPITAL_COMMUNITY): Payer: Self-pay

## 2016-03-01 VITALS — BP 125/80 | HR 95 | Ht 65.0 in | Wt 210.0 lb

## 2016-03-01 DIAGNOSIS — R5383 Other fatigue: Secondary | ICD-10-CM | POA: Diagnosis not present

## 2016-03-01 DIAGNOSIS — F411 Generalized anxiety disorder: Secondary | ICD-10-CM

## 2016-03-01 DIAGNOSIS — M6289 Other specified disorders of muscle: Secondary | ICD-10-CM

## 2016-03-01 DIAGNOSIS — R42 Dizziness and giddiness: Secondary | ICD-10-CM | POA: Diagnosis not present

## 2016-03-01 DIAGNOSIS — H538 Other visual disturbances: Secondary | ICD-10-CM | POA: Diagnosis not present

## 2016-03-01 DIAGNOSIS — F419 Anxiety disorder, unspecified: Secondary | ICD-10-CM | POA: Diagnosis not present

## 2016-03-01 DIAGNOSIS — I10 Essential (primary) hypertension: Secondary | ICD-10-CM | POA: Diagnosis not present

## 2016-03-01 DIAGNOSIS — R4789 Other speech disturbances: Secondary | ICD-10-CM

## 2016-03-01 DIAGNOSIS — R531 Weakness: Secondary | ICD-10-CM | POA: Diagnosis not present

## 2016-03-01 DIAGNOSIS — R1310 Dysphagia, unspecified: Secondary | ICD-10-CM | POA: Diagnosis not present

## 2016-03-01 HISTORY — DX: Weakness: R53.1

## 2016-03-01 HISTORY — DX: Major depressive disorder, single episode, unspecified: F32.9

## 2016-03-01 HISTORY — DX: Other complications of anesthesia, initial encounter: T88.59XA

## 2016-03-01 HISTORY — DX: Nocturia: R35.1

## 2016-03-01 HISTORY — DX: Vitamin D deficiency, unspecified: E55.9

## 2016-03-01 HISTORY — DX: Depression, unspecified: F32.A

## 2016-03-01 HISTORY — DX: Insomnia, unspecified: G47.00

## 2016-03-01 HISTORY — DX: Adverse effect of unspecified anesthetic, initial encounter: T41.45XA

## 2016-03-01 LAB — CBC
HCT: 43.6 % (ref 39.0–52.0)
HEMOGLOBIN: 15.5 g/dL (ref 13.0–17.0)
MCH: 31.6 pg (ref 26.0–34.0)
MCHC: 35.6 g/dL (ref 30.0–36.0)
MCV: 88.8 fL (ref 78.0–100.0)
PLATELETS: 337 10*3/uL (ref 150–400)
RBC: 4.91 MIL/uL (ref 4.22–5.81)
RDW: 13.5 % (ref 11.5–15.5)
WBC: 7.8 10*3/uL (ref 4.0–10.5)

## 2016-03-01 LAB — BASIC METABOLIC PANEL
ANION GAP: 11 (ref 5–15)
BUN: 10 mg/dL (ref 6–20)
CALCIUM: 9.7 mg/dL (ref 8.9–10.3)
CO2: 23 mmol/L (ref 22–32)
CREATININE: 0.63 mg/dL (ref 0.61–1.24)
Chloride: 101 mmol/L (ref 101–111)
Glucose, Bld: 113 mg/dL — ABNORMAL HIGH (ref 65–99)
Potassium: 4.1 mmol/L (ref 3.5–5.1)
SODIUM: 135 mmol/L (ref 135–145)

## 2016-03-01 NOTE — Telephone Encounter (Signed)
Pt coming at 345pm instead for 4pm appt. [place] in phone room spoke to pt.

## 2016-03-01 NOTE — Progress Notes (Addendum)
[facility]  PATIENT: Ronda FairlyKermit D Grupe Jr. DOB: November 11, 1971   REASON FOR VISIT: Needs H&P for MRI HISTORY FROM: Patient   HISTORY OF PRESENT ILLNESS: [name], [age] year old male seen for H&P for MRI scheduled for tomorrow. He thinks he had a stroke back in August when he was told that his mother was dying and she did pass in January of this year. He claims he has had fatigue for many years and digestive issues for at least 12 years.  He complains of left sided and right-sided weakness and word finding difficulties and pain in his head mainly in the frontal and parietal area on the right. He has had problems swallowing since last fall that is gotten worse in the last month he has had anxiety disorder all of his life and complains with depression. He has seen a counselor in the last month or so but only once. He does not work. His head feels "foggy", he complains of insomnia and in fact  was due to have a sleep test but no showed for the appointment. No other neurologic symptoms identified    REVIEW OF SYSTEMS: Full 14 system review of systems performed and notable only for those listed, all others are neg:  Constitutional: Fatigue  Cardiovascular: neg Ear/Nose/Throat: Trouble swallowing, hearing loss  Skin: neg Eyes: Blurred vision Respiratory: neg Gastroitestinal: neg  Hematology/Lymphatic: neg  Endocrine: neg Musculoskeletal:neg Allergy/Immunology: Environmental allergies, food allergies Neurological: Memory loss dizziness numbness speech difficulty and weakness Psychiatric: Depression and anxiety Sleep : Insomnia, snoring   ALLERGIES: Allergies  Allergen Reactions  . Shellfish Allergy Anaphylaxis  . Eggs Or Egg-Derived Products Other (See Comments)    Swelling in throat    HOME MEDICATIONS: Outpatient Prescriptions Prior to Visit  Medication Sig Dispense Refill  . aspirin 81 MG tablet Take 81 mg by mouth daily.    Marland Kitchen. GARLIC PO Take 1 capsule by mouth 2  (two) times daily. Reported on 02/04/2016    . OIL OF OREGANO PO Take 1 capsule by mouth 2 (two) times daily. Reported on 02/04/2016    . Vitamin D, Ergocalciferol, (DRISDOL) 50000 units CAPS capsule Take 50,000 Units by mouth every 7 (seven) days.    Marland Kitchen. losartan (COZAAR) 50 MG tablet Take 1 tablet (50 mg total) by mouth daily. 30 tablet 6   No facility-administered medications prior to visit.    PAST MEDICAL HISTORY: Past Medical History  Diagnosis Date  . IBS (irritable bowel syndrome)   . Headache(784.0)     "most days recently related to my blood pressure" (06/17/2013)  . Environmental allergies     "pretty much anything that grows, I'm allergic to it" (06/17/2013)  . 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 Hypertension     takes Losartan daily  . Weakness     both hands and left leg .Numbness and tingling  . Arthritis     lower back   . Vitamin D deficiency     takes Vit D weekly  . Nocturia   . Depression     was on Lexapro but stopped a few months ago  . Insomnia     doesn't take any meds  . Complication of anesthesia     FYI: Egg and shellfish allergies    PAST SURGICAL HISTORY: Past Surgical History  Procedure Laterality Date  . Wisdom tooth extraction  2000's  . Colonoscopy      FAMILY HISTORY: Family History  Problem Relation Age of Onset  . Hypertension Mother   .  Stroke Mother   . Colon cancer Father 46  . Prostate cancer Neg Hx   . Diabetes Neg Hx     SOCIAL HISTORY: Social History   Social History  . Marital Status: Single    Spouse Name: N/A  . Number of Children: 0  . Years of Education: 12+   Occupational History  . Unemployed    Social History Main Topics  . Smoking status: Never Smoker   . Smokeless tobacco: Never Used  . Alcohol Use: No     Comment: 06/17/2013 "1-2 drinks/yr"  . Drug Use: No  . Sexual Activity: No   Other Topics Concern  . Not on file   Social History Narrative   No job at present,  Single,  No children.   Lives at  mother's house , lost mother [name] 12-2015      Caffeine use: Coffee.tea- rare     PHYSICAL EXAM  Filed Vitals:   03/01/16 1553  BP: 125/80  Pulse: 95  Height:  (1.651 m)  Weight: 210 lb (95.255 kg)   Body mass index is 34.95 kg/(m^2).  Generalized: Well developed, Obese male in no acute distress, well-groomed  Head: normocephalic and atraumatic,. Oropharynx benign  Neck: Supple, no carotid bruits  Cardiac: Regular rate rhythm, no murmur  Musculoskeletal: No deformity   Neurological examination   Mentation: Alert oriented to time, place, history taking. Attention span and concentration appropriate. Recent and remote memory intact.  Follows all commands speech and language fluent.   Cranial nerve II-XII: .Pupils were equal round reactive to light extraocular movements were full, visual field were full on confrontational test. Facial sensation and strength were normal. hearing was intact to finger rubbing bilaterally. Uvula tongue midline. head turning and shoulder shrug were normal and symmetric.Tongue protrusion into cheek strength was normal. Motor: normal bulk and tone, full strength in the BUE, BLE, fine finger movements normal, no pronator drift. No focal weakness. No involuntary movement Sensory: normal and symmetric to light touch, pinprick, and  Vibration, proprioception  Coordination: finger-nose-finger, heel-to-shin bilaterally, no dysmetria Reflexes: Symmetric in the upper and lower extremities, plantar responses were flexor bilaterally. Gait and Station: Rising up from seated position without assistance, normal stance,  moderate stride, good arm swing, smooth turning, able to perform tiptoe, and heel walking without difficulty. Tandem gait is steady.   DIAGNOSTIC DATA (LABS, IMAGING, TESTING) - I reviewed patient records, labs, notes, testing and imaging myself where available.  Lab Results  Component Value Date   WBC 7.8 03/01/2016   HGB 15.5 03/01/2016   HCT  43.6 03/01/2016   MCV 88.8 03/01/2016   PLT 337 03/01/2016      Component Value Date/Time   NA 135 03/01/2016 1351   K 4.1 03/01/2016 1351   CL 101 03/01/2016 1351   CO2 23 03/01/2016 1351   GLUCOSE 113* 03/01/2016 1351   BUN 10 03/01/2016 1351   CREATININE 0.63 03/01/2016 1351   CREATININE 0.81 06/14/2013 1705   CALCIUM 9.7 03/01/2016 1351   PROT 8.0 06/21/2013 2018   ALBUMIN 4.3 06/21/2013 2018   AST 20 06/21/2013 2018   ALT 18 06/21/2013 2018   ALKPHOS 66 06/21/2013 2018   BILITOT 0.7 06/21/2013 2018   GFRNONAA >60 03/01/2016 1351   GFRAA >60 03/01/2016 1351    Lab Results  Component Value Date   HGBA1C 5.8* 02/04/2016    ASSESSMENT AND PLAN  45 y.o. year old male  with a history of multiple complaints in the  setting of stress including the left and right-sided weakness, dizziness, difficulty finding words, fatigue ,right-sided brain pain , swallowing difficulties. Patient feels he had a stroke in August of last year he requires sedation for MRI which is scheduled for tomorrow. Neuro exam is essentially unremarkable MRI will  rule out intracranial abnormalities    Patient will follow-up with Dr. Lucia Gaskins for plan of care after MRI Nilda Riggs, [facility], [facility], APRN  [facility] [address], [address] [place], [place] [address] [phone]  Personally participated in and made any corrections needed to history, physical, neuro exam,assessment and plan as stated above.  I have personally evaluated lab data, reviewed imaging studies and agree with radiology interpretations.    Naomie Dean, MD [facility]

## 2016-03-01 NOTE — Pre-Procedure Instructions (Signed)
    Ronda FairlyKermit D Taney Jr.  03/01/2016      [id number] - [place], [place] - [address] [place] [id number] Phone: [phone] Fax: [phone]    Your procedure is scheduled on Thurs, Mar 23 @ 8:15 AM  [facility] Admitting at 6:15 AM  Call this number if you have problems the morning of surgery:  [phone]   Remember:  Do not eat food or drink liquids after midnight.               No Goody's,BC's,Aleve,Aspirin,Ibuprofen,Motrin,Advil,Fish Oil,or any Herbal Medications.    Do not wear jewelry.  Do not wear lotions, powders, or colognes.  You may wear deodorant.  Men may shave face and neck.  Do not bring valuables to the hospital.  [facility] is not responsible for any belongings or valuables.  Contacts, dentures or bridgework may not be worn into surgery.  Leave your suitcase in the car.  After surgery it may be brought to your room.  For patients admitted to the hospital, discharge time will be determined by your treatment team.  Patients discharged the day of surgery will not be allowed to drive home.     Please read over the following fact sheets that you were given. Pain Booklet and Coughing and Deep Breathing

## 2016-03-01 NOTE — Progress Notes (Addendum)
Requested  H&P from Dr.Antonio Ahern for MRI scheduled for 03/02/16

## 2016-03-01 NOTE — Telephone Encounter (Signed)
[name][facility] [phone] needs H&P from Dr. Lucia GaskinsAhern for patient's scheduled MRI tomorrow 03/02/16 8:15am.

## 2016-03-01 NOTE — Patient Instructions (Signed)
MRI under general anesthesia tomorrow Dr. Lucia GaskinsAhern will call after the results are read for further plan of care

## 2016-03-01 NOTE — Telephone Encounter (Signed)
Called Edwin Vaughan back. Advised pt was last seen on 01/17/16. She was not sure if this note could be used or not. She is going to check with supervisor and call me back to advise.

## 2016-03-01 NOTE — Progress Notes (Addendum)
Cardiologist denies   Medical Md is Dr. Willow OraJose Paz  Echo denies  Stress test denies  Heart cath denies  EKG in epic from 07-2015  CXR denies in past yr  Sleep study was scheduled but had to cancel.Plans to follow up

## 2016-03-01 NOTE — Progress Notes (Addendum)
H&P in epic from Feb 6,2017 is out of 30 day range so a new H&P would need to be done.

## 2016-03-01 NOTE — Telephone Encounter (Signed)
Edwin Vaughan- FYI  [name] called back. Pt needs an history and physical no more than 30 days prior to scheduled MRI. I placed pt on [name] schedule at 430pm. Pt was in office with Silva BandyKristi and she let pt know to check in at our office by 415pm.

## 2016-03-01 NOTE — Telephone Encounter (Signed)
Called pt to see if he would want to come at 345pm instead of 415pm for appt with Eber Jonesarolyn. Eber JonesCarolyn had a cx at 400pm. Gave [facility] phone number.   Also called sister to try and have her let pt know to call us back.

## 2016-03-02 ENCOUNTER — Encounter (HOSPITAL_COMMUNITY)
Admission: RE | Disposition: A | Discharge status: Home / Self Care | Payer: Self-pay | Source: Ambulatory Visit | Attending: Neurology

## 2016-03-02 ENCOUNTER — Ambulatory Visit (HOSPITAL_COMMUNITY): Payer: BLUE CROSS/BLUE SHIELD | Admitting: Vascular Surgery

## 2016-03-02 ENCOUNTER — Ambulatory Visit (HOSPITAL_COMMUNITY)
Admission: RE | Admit: 2016-03-02 | Discharge: 2016-03-02 | Disposition: A | Discharge status: Home / Self Care | Payer: BLUE CROSS/BLUE SHIELD | Source: Ambulatory Visit | Attending: Neurology | Admitting: Neurology

## 2016-03-02 ENCOUNTER — Telehealth: Payer: Self-pay | Admitting: *Deleted

## 2016-03-02 ENCOUNTER — Ambulatory Visit (HOSPITAL_COMMUNITY): Payer: BLUE CROSS/BLUE SHIELD | Admitting: Anesthesiology

## 2016-03-02 ENCOUNTER — Encounter (HOSPITAL_COMMUNITY): Payer: Self-pay | Admitting: General Practice

## 2016-03-02 DIAGNOSIS — H538 Other visual disturbances: Secondary | ICD-10-CM

## 2016-03-02 DIAGNOSIS — R1310 Dysphagia, unspecified: Secondary | ICD-10-CM | POA: Insufficient documentation

## 2016-03-02 DIAGNOSIS — R531 Weakness: Secondary | ICD-10-CM | POA: Diagnosis not present

## 2016-03-02 DIAGNOSIS — R4789 Other speech disturbances: Secondary | ICD-10-CM

## 2016-03-02 DIAGNOSIS — R42 Dizziness and giddiness: Secondary | ICD-10-CM | POA: Insufficient documentation

## 2016-03-02 DIAGNOSIS — I10 Essential (primary) hypertension: Secondary | ICD-10-CM | POA: Insufficient documentation

## 2016-03-02 DIAGNOSIS — N5089 Other specified disorders of the male genital organs: Secondary | ICD-10-CM

## 2016-03-02 HISTORY — PX: RADIOLOGY WITH ANESTHESIA: SHX6223

## 2016-03-02 SURGERY — RADIOLOGY WITH ANESTHESIA
Anesthesia: General | Site: Head

## 2016-03-02 MED ORDER — GADOBENATE DIMEGLUMINE 529 MG/ML IV SOLN
20.0000 mL | Freq: Once | INTRAVENOUS | Status: AC
  Administered 2016-03-02: 20 mL via INTRAVENOUS

## 2016-03-02 MED ORDER — LACTATED RINGERS IV SOLN
INTRAVENOUS | Status: DC
  Administered 2016-03-02: 07:00:00 via INTRAVENOUS

## 2016-03-02 MED ORDER — PROPOFOL 10 MG/ML IV BOLUS
INTRAVENOUS | Status: DC | PRN
  Administered 2016-03-02: 200 mg via INTRAVENOUS

## 2016-03-02 MED ORDER — ETOMIDATE 2 MG/ML IV SOLN
INTRAVENOUS | Status: DC | PRN
  Administered 2016-03-02: 20 mg via INTRAVENOUS

## 2016-03-02 NOTE — H&P (Signed)
History and Physical Exam reviewed; patient is OK for planned anesthetic and procedure.  

## 2016-03-02 NOTE — Telephone Encounter (Signed)
-----   Message from Anson FretAntonia B Ahern, MD sent at 03/01/2016  5:30 PM EDT ----- Labs look fine thanks

## 2016-03-02 NOTE — Anesthesia Preprocedure Evaluation (Addendum)
Anesthesia Evaluation  Patient identified by MRN, date of birth, ID band Patient awake    Reviewed: Allergy & Precautions, NPO status , Patient's Chart, lab work & pertinent test results  Airway Mallampati: II  TM Distance: >3 FB Neck ROM: Full    Dental no notable dental hx.    Pulmonary    Pulmonary exam normal breath sounds clear to auscultation       Cardiovascular hypertension, Pt. on medications Normal cardiovascular exam Rhythm:Regular Rate:Normal     Neuro/Psych PSYCHIATRIC DISORDERS Anxiety Depression    GI/Hepatic IBS   Endo/Other    Renal/GU      Musculoskeletal  (+) Arthritis ,   Abdominal   Peds  Hematology   Anesthesia Other Findings Patient with psychiatric history primarily manisfested by large allergy list, anxiety, intermittent and alternating patterns of weakness, IBS, and psychogenic polydypsia  Egg allergy is specifically "sometimes it feels like my throat is swelling after eating eggs". Patient has eaten cake and cookies in past with no issues.   Reproductive/Obstetrics                            Anesthesia Physical Anesthesia Plan  ASA: III  Anesthesia Plan: General   Post-op Pain Management:    Induction: Intravenous  Airway Management Planned: LMA  Additional Equipment:   Intra-op Plan:   Post-operative Plan: Extubation in OR  Informed Consent: I have reviewed the patients History and Physical, chart, labs and discussed the procedure including the risks, benefits and alternatives for the proposed anesthesia with the patient or authorized representative who has indicated his/her understanding and acceptance.   Dental advisory given  Plan Discussed with: Anesthesiologist and CRNA  Anesthesia Plan Comments: (GA with LMA or ETT, will consider etomidate over propofol induction given "egg allergy")        Anesthesia Quick Evaluation

## 2016-03-02 NOTE — Anesthesia Procedure Notes (Signed)
Procedure Name: LMA Insertion Date/Time: 03/02/2016 8:50 AM Performed by: Marena ChancyBECKNER, Raffaela Ladley S Pre-anesthesia Checklist: Patient identified, Timeout performed, Emergency Drugs available, Suction available and Patient being monitored Patient Re-evaluated:Patient Re-evaluated prior to inductionOxygen Delivery Method: Circle system utilized Preoxygenation: Pre-oxygenation with 100% oxygen Intubation Type: IV induction LMA: LMA inserted LMA Size: 4.0 Number of attempts: 1 Placement Confirmation: positive ETCO2 and breath sounds checked- equal and bilateral Tube secured with: Tape Dental Injury: Teeth and Oropharynx as per pre-operative assessment

## 2016-03-02 NOTE — Anesthesia Postprocedure Evaluation (Signed)
Anesthesia Post Note  Patient: Edwin FairlyKermit D Chaikin Jr.  Procedure(s) Performed: Procedure(s) (LRB): MRI OF BRAIN W/WO    (RADIOLOGY WITH ANESTHESIA) (N/A)  Patient location during evaluation: PACU Anesthesia Type: General Level of consciousness: awake and alert Pain management: pain level controlled Vital Signs Assessment: post-procedure vital signs reviewed and stable Respiratory status: spontaneous breathing, nonlabored ventilation, respiratory function stable and patient connected to nasal cannula oxygen Cardiovascular status: blood pressure returned to baseline and stable Postop Assessment: no signs of nausea or vomiting Anesthetic complications: no    Last Vitals:  Filed Vitals:   03/02/16 1010 03/02/16 1015  BP:    Pulse: 69 78  Temp: 36.8 C   Resp: 22 19    Last Pain: There were no vitals filed for this visit.               Kennieth RadFitzgerald, Carzell Saldivar E

## 2016-03-02 NOTE — Telephone Encounter (Signed)
Called pt and spoke to him about labs look ok per Dr Lucia GaskinsAhern and MRI normal for age per Dr Lucia GaskinsAhern. He verbalized understanding. Made f./u to review images per pt request for 3/31 at 10am, check in 945am. Ok to use NP slot per Dr Lucia GaskinsAhern.

## 2016-03-02 NOTE — Transfer of Care (Signed)
Immediate Anesthesia Transfer of Care Note  Patient: Edwin FairlyKermit D Windmiller Jr.  Procedure(s) Performed: Procedure(s): MRI OF BRAIN W/WO    (RADIOLOGY WITH ANESTHESIA) (N/A)  Patient Location: PACU  Anesthesia Type:General  Level of Consciousness: awake, alert  and oriented  Airway & Oxygen Therapy: Patient Spontanous Breathing  Post-op Assessment: Report given to RN, Post -op Vital signs reviewed and stable and Patient moving all extremities X 4  Post vital signs: Reviewed and stable  Last Vitals:  Filed Vitals:   03/02/16 0950 03/02/16 1000  BP: 129/81   Pulse: 80 81  Temp: 36.8 C   Resp: 17 14    Complications: No apparent anesthesia complications

## 2016-03-03 ENCOUNTER — Encounter (HOSPITAL_COMMUNITY): Payer: Self-pay | Admitting: Radiology

## 2016-03-10 ENCOUNTER — Encounter: Payer: Self-pay | Admitting: Neurology

## 2016-03-10 ENCOUNTER — Encounter: Payer: Self-pay | Admitting: Internal Medicine

## 2016-03-10 ENCOUNTER — Ambulatory Visit (INDEPENDENT_AMBULATORY_CARE_PROVIDER_SITE_OTHER): Payer: BLUE CROSS/BLUE SHIELD | Admitting: Internal Medicine

## 2016-03-10 ENCOUNTER — Ambulatory Visit (INDEPENDENT_AMBULATORY_CARE_PROVIDER_SITE_OTHER): Payer: BLUE CROSS/BLUE SHIELD | Admitting: Neurology

## 2016-03-10 VITALS — BP 112/75 | HR 78 | Wt 204.4 lb

## 2016-03-10 VITALS — BP 126/78 | HR 80 | Temp 98.3°F | Ht 65.0 in | Wt 205.1 lb

## 2016-03-10 DIAGNOSIS — F32A Depression, unspecified: Secondary | ICD-10-CM

## 2016-03-10 DIAGNOSIS — F418 Other specified anxiety disorders: Secondary | ICD-10-CM | POA: Diagnosis not present

## 2016-03-10 DIAGNOSIS — Z09 Encounter for follow-up examination after completed treatment for conditions other than malignant neoplasm: Secondary | ICD-10-CM

## 2016-03-10 DIAGNOSIS — F419 Anxiety disorder, unspecified: Principal | ICD-10-CM

## 2016-03-10 DIAGNOSIS — R6889 Other general symptoms and signs: Secondary | ICD-10-CM

## 2016-03-10 DIAGNOSIS — F329 Major depressive disorder, single episode, unspecified: Secondary | ICD-10-CM

## 2016-03-10 MED ORDER — FLUOXETINE HCL 20 MG PO TABS: 20.0000 mg | ORAL_TABLET | Freq: Every day | ORAL | Status: DC

## 2016-03-10 NOTE — Assessment & Plan Note (Signed)
Anxiety depression: His PHQ -9 is 25 ; despite this high number, he seems to be doing well on exam today. Previously tried Paxil and Lexapro, self DC'd Lexapro because he felt many of the sx he was having were related to it and although that doesn't seem to be case would like to try something new. Plan: Start Prozac, strongly warned about suicidal ideas, see psychiatry as scheduled, see his counselor.  Dysphagia: See previous visit, did not take PPIs or have a barium swallow , sx  have decreased. Observe for now Multiple symptoms, just saw neurology, MRI was negative. Observation, likely multiple symptoms related to anxiety and depression Sleep study: Referral pending RTC 6 weeks

## 2016-03-10 NOTE — Progress Notes (Signed)
Subjective:    Patient ID: Edwin FairlyKermit D Osterberg Jr., male    DOB: [birth year], 45 y.o.   MRN: [id number]  DOS:  03/10/2016 Type of visit - description : Acute visit Interval history: Here for anxiety and depression management. He continue with anxiety, depression. Denies any suicidal ideas but would like to be treated with medication. He does have an appointment scheduled with [facility] for April 20. Plans to see his counselor again.    Review of Systems   Past Medical History  Diagnosis Date  . IBS (irritable bowel syndrome)   . Headache(784.0)     "most days recently related to my blood pressure" (06/17/2013)  . Environmental allergies     "pretty much anything that grows, I'm allergic to it" (06/17/2013)  . 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 Hypertension     takes Losartan daily  . Weakness     both hands and left leg .Numbness and tingling  . Arthritis     lower back   . Vitamin D deficiency     takes Vit D weekly  . Nocturia   . Depression     was on Lexapro but stopped a few months ago  . Insomnia     doesn't take any meds  . Complication of anesthesia     FYI: Egg and shellfish allergies    Past Surgical History  Procedure Laterality Date  . Wisdom tooth extraction  2000's  . Colonoscopy    . Radiology with anesthesia N/A 03/02/2016    Procedure: MRI OF BRAIN W/WO    (RADIOLOGY WITH ANESTHESIA);  Surgeon: Medication Radiologist, MD;  Location: [facility] OR;  Service: Radiology;  Laterality: N/A;    Social History   Social History  . Marital Status: Single    Spouse Name: N/A  . Number of Children: 0  . Years of Education: 12+   Occupational History  . Unemployed    Social History Main Topics  . Smoking status: Never Smoker   . Smokeless tobacco: Never Used  . Alcohol Use: No     Comment: 06/17/2013 "1-2 drinks/yr"  . Drug Use: No  . Sexual Activity: No   Other Topics Concern  . Not on file   Social History Narrative   No job at present,  Single,  No  children.   Lives at mother's house , lost mother [name] 12-2015      Caffeine use: Coffee.tea- rare        Medication List       This list is accurate as of: 03/10/16  7:02 PM.  Always use your most recent med list.               aspirin 81 MG tablet  Take 81 mg by mouth daily.     Fish Oil 1000 MG Caps  Take by mouth.     FLUoxetine 20 MG tablet  Commonly known as:  PROZAC  Take 1 tablet (20 mg total) by mouth daily.     GARLIC PO  Take 1 capsule by mouth 2 (two) times daily. Reported on 02/04/2016     losartan 50 MG tablet  Commonly known as:  COZAAR  Take 50 mg by mouth daily.     OIL OF OREGANO PO  Take 1 capsule by mouth 2 (two) times daily. Reported on 02/04/2016     Vitamin D (Ergocalciferol) 50000 units Caps capsule  Commonly known as:  DRISDOL  Take 50,000 Units by mouth every 7 (seven)  days.           Objective:   Physical Exam BP 126/78 mmHg  Pulse 80  Temp(Src) 98.3 F (36.8 C) (Oral)  Ht  (1.651 m)  Wt 205 lb 2 oz (93.044 kg)  BMI 34.13 kg/m2  SpO2 98% General:   Well developed, well nourished . NAD.  HEENT:  Normocephalic . Face symmetric, atraumatic Neurologic:  alert & oriented X3.  Speech normal, gait appropriate for age and unassisted Psych--  Cognition and judgment appear intact.  Cooperative with normal attention span and concentration.  Behavior appropriate. Today, he does seem to be doing well emotionally, does not look anxious, depressed, is actually smiling.     Assessment & Plan:  Assessment  HTN Anxiety depression,  insomnia--- admitted 2014  Binge eating d/o Psychogenic polydipsia  IBS Chronic headaches MSK: Chronic back pain Shellfish Allergy Stroke?  concerned about a stroke in 2014  >> MRI (-) saw neuro (no issues found) Concerned again ~07-2015, CT head neg ; saw neuro 01-17-2016 , sx felt to be psych, rx a MRI: wnl   PLAN: Anxiety depression: His PHQ -9 is 25 ; despite this high number, he seems to be  doing well on exam today. Previously tried Paxil and Lexapro, self DC'd Lexapro because he felt many of the sx he was having were related to it and although that doesn't seem to be case would like to try something new. Plan: Start Prozac, strongly warned about suicidal ideas, see psychiatry as scheduled, see his counselor.  Dysphagia: See previous visit, did not take PPIs or have a barium swallow , sx  have decreased. Observe for now Multiple symptoms, just saw neurology, MRI was negative. Observation, likely multiple symptoms related to anxiety and depression Sleep study: Referral pending RTC 6 weeks

## 2016-03-10 NOTE — Patient Instructions (Addendum)
Remember to drink plenty of fluid, eat healthy meals and do not skip any meals. Try to eat protein with a every meal and eat a healthy snack such as fruit or nuts in between meals. Try to keep a regular sleep-wake schedule and try to exercise daily, particularly in the form of walking, 20-30 minutes a day, if you can.   Follow up with psychiatry  Our phone number is [phone]. We also have an after hours call service for urgent matters and there is a physician on-call for urgent questions. For any emergencies you know to call 911 or go to the nearest emergency room

## 2016-03-10 NOTE — Progress Notes (Signed)
[facility]    Provider:  Dr Edwin Vaughan Referring Provider: Wanda PlumpPaz, Edwin E, Vaughan Primary Care Physician:  Edwin Vaughan  CC: Possible stroke in the setting of stress  Interval history 03/10/2016: Patient is here for follow up after MRI of the brain that was normal for age and stable since 2014. He has multiple symptoms in the setting of stress. He has a history of this. He does say he feels very anxious and symptoms worsen and he has lots of fear.  He is complaining of multiple somatic and neurologic complaints today, he has been interacting with stroke survivors and he really feels he is a stroke survivor. His life was very much wrapped up with his mom, he never married, he lived with her all his life and she had several strokes and then died and symptoms worsened since then.He has follow up with psychiatry in a few weeks. He has stopped anti-anxiety medication on his own. He is also seeing Dr. Drue Vaughan.  MRI of the brain 03/02/2016: Stable since 2014 and normal for age MRI appearance of the brain  HPI: Edwin D Mosetta AnisLewis Jr. is a 45 y.o. male here as a referral from Dr. Drue Vaughan for possible stroke in the setting of increased stress. He has a past medical history of irritable bowel syndrome, hypertension, headache, arthritis, chronic lower back pain, anxiety and depression, psychogenic polydipsia. He has been under a lot of stress. His mother just passed away. He was eating bad food every day. He woke up one morning in August 2016 and felt emotionally different. Felt part of his brain was no feeling right. Since then he has had ongoing symptoms. He has IBS and he has not worked in 5 years. He is alone a lot, he doesn't get out a lot. He is having a hard time finding his words. Not pronouncing his S's like he should. This past weekend he got Congoch[place] food and he has been feeling miserable ever since. He has pain on the right side of the brain, he has sharp pain on the right side of the brain occasionally  (points to the parietal area). This has resolved for the most part. He didn;t feel as responsive to the reflex test on the left last week at his doctor's office and has felt heavier and weaker on the left side ever since then. The past week he is sleeping a lot, sluggish exhausted. Hs is having trouble swallowing a lot, not choking but felt like he had crumbs in the back of his throat even after he drinks liquid. Foggy thinking. Symptoms are progressive in the last week and a half. Saturday he went to [place] and [place] of the head was negative. He went off of the Lexapro last August as he felt like he had a stroke. No other focal neurologic symptoms or systemic symptoms.  Reviewed notes, labs and imaging from outside physicians, which showed: per Dr. Drue Vaughan notes "Anxiety depression: Self discontinue Lexapro several months ago as he was feeling emotionally flat and was concerned about a stroke. Since then he continue to be under a lot of stress, mild depression. Loss his mother few weeks ago.".  MRI of the brain in July 2014 (personally reviewed images and agree with the following): Essentially normal MRI of the brain. Few punctate white matter foci not likely to be of any significance. No cause of headache and dizziness is identified.  [facility]. He was complaining of fatigue, dizziness and  left-sided weakness that started 1 week before. He reported a few months previous in August 2016 he was under a lot of stress and felt like he had a mini stroke. CT scan his primary care was negative. Patient tried an MRI but he was unable due to claustrophobia. Patient reports not thinking clearly this is worsened when heating foods high in sodium. Patient also reported an emotional change, being forgetful, stumbling around, blurred vision, nausea and tingling in his left arm for one day. He was referred to neurology.  Labs on February 2017 included unremarkable CBC, BMP with  low chloride at 91, creatinine 0.68 and low sodium at 132.  CT of the head was negative without contrast. No intra-or extra-axial fluid collection or mass lesion. Basilar cisterns and ventricles have a normal appearance. No CT evidence for acute infarction or hemorrhage.   Review of Systems: Patient complains of symptoms per HPI as well as the following symptoms: fatigue, blurred vision, swelling, hearing loss, allergies, memory loss, confusion, headache, weakness, difficulty swallowing, depression, decreased energy. Pertinent negatives per HPI. All others negative.   Social History   Social History  . Marital Status: Single    Spouse Name: N/A  . Number of Children: 0  . Years of Education: 12+   Occupational History  . Unemployed    Social History Main Topics  . Smoking status: Never Smoker   . Smokeless tobacco: Never Used  . Alcohol Use: No     Comment: 06/17/2013 "1-2 drinks/yr"  . Drug Use: No  . Sexual Activity: No   Other Topics Concern  . Not on file   Social History Narrative   No job at present,  Single,  No children.   Lives at mother's house , lost mother Edwin Vaughan 12-2015      Caffeine use: Coffee.tea- rare    Family History  Problem Relation Age of Onset  . Hypertension Mother   . Stroke Mother   . Colon cancer Father 38  . Prostate cancer Neg Hx   . Diabetes Neg Hx     Past Medical History  Diagnosis Date  . IBS (irritable bowel syndrome)   . Headache(784.0)     "most days recently related to my blood pressure" (06/17/2013)  . Environmental allergies     "pretty much anything that grows, I'm allergic to it" (06/17/2013)  . 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 Hypertension     takes Losartan daily  . Weakness     both hands and left leg .Numbness and tingling  . Arthritis     lower back   . Vitamin D deficiency     takes Vit D weekly  . Nocturia   . Depression     was on Lexapro but stopped a few months ago  . Insomnia     doesn't take any meds    . Complication of anesthesia     FYI: Egg and shellfish allergies    Past Surgical History  Procedure Laterality Date  . Wisdom tooth extraction  2000's  . Colonoscopy    . Radiology with anesthesia N/A 03/02/2016    Procedure: MRI OF BRAIN W/WO    (RADIOLOGY WITH ANESTHESIA);  Surgeon: Medication Radiologist, Vaughan;  Location: [facility] OR;  Service: Radiology;  Laterality: N/A;    Current Outpatient Prescriptions  Medication Sig Dispense Refill  . aspirin 81 MG tablet Take 81 mg by mouth daily.    Marland Kitchen GARLIC PO Take 1 capsule by mouth 2 (two) times daily. Reported  on 02/04/2016    . losartan (COZAAR) 25 MG tablet Take 25 mg by mouth. Pt unsure of dose    . OIL OF OREGANO PO Take 1 capsule by mouth 2 (two) times daily. Reported on 02/04/2016    . Omega-3 Fatty Acids (FISH OIL) 1000 MG CAPS Take by mouth.    . Vitamin D, Ergocalciferol, (DRISDOL) 50000 units CAPS capsule Take 50,000 Units by mouth every 7 (seven) days.     No current facility-administered medications for this visit.    Allergies as of 03/10/2016 - Review Complete 03/02/2016  Allergen Reaction Noted  . Eggs or egg-derived products Anaphylaxis 02/25/2016  . Shellfish allergy Anaphylaxis 06/17/2013    Vitals: There were no vitals taken for this visit. Last Weight:  Wt Readings from Last 1 Encounters:  03/02/16 210 lb (95.255 kg)   Last Height:   Ht Readings from Last 1 Encounters:  03/02/16  (1.651 m)    Physical exam: Exam: Gen: NAD, conversant, well nourised, obese, well groomed  CV: RRR, no MRG. No Carotid Bruits. No peripheral edema, warm, nontender Eyes: Conjunctivae clear without exudates or hemorrhage  Neuro: Detailed Neurologic Exam  Speech:  Speech is normal; fluent and spontaneous with normal comprehension.  Cognition:  The patient is oriented to person, place, and time;   recent and remote memory intact;   language fluent;   normal attention, concentration,    fund of knowledge Cranial Nerves:  The pupils are equal, round, and reactive to light. The fundi are normal and spontaneous venous pulsations are present. Visual fields are full to finger confrontation. Extraocular movements are intact. Trigeminal sensation is intact and the muscles of mastication are normal. The face is symmetric. The palate elevates in the midline. Hearing intact. Voice is normal. Shoulder shrug is normal. The tongue has normal motion without fasciculations.   Coordination:  Normal finger to nose and heel to shin. Normal rapid alternating movements.   Gait:  Heel-toe and tandem gait are normal.   Motor Observation:  No asymmetry, no atrophy, and no involuntary movements noted. Tone:  Normal muscle tone.   Posture:  Posture is normal. normal erect   Strength:  Strength is V/V in the upper and lower limbs.    Sensation: intact to LT   Reflex Exam:  DTR's:  Deep tendon reflexes in the upper and lower extremities are normal bilaterally.  Toes:  The toes are downgoing bilaterally.  Clonus:  Clonus is absent.      Assessment/Plan: 45 year old patient with multiple complaints in the setting of stress included left sided and right-sided weakness and sensory changes, dizziness, difficulty finding his words, fatigue, right sided brain pain, exhaustion, swallowing problems, fogginess. He felt as though he had a mini stroke in August 2016 when all the symptoms started in the setting of stress and mother's death. Symptoms worsening over the last several weeks. Was seen at [facility] urgent care with a negative CT of the head. Neuro exam is unremarkable. I suspect there is a large contribution to his symptoms from psychiatric causes Repeat MRI of the brain was stable from 2014 and normal for age. He has follow up with psychiatry soon.   Naomie Dean, Vaughan  [facility] [address] [place], [place]  [address]  Phone [phone] Fax [phone]  A total of 30 minutes was spent face-to-face with this patient. Over half this time was spent on counseling patient on the multiple complaints diagnosis and different diagnostic and therapeutic options available.

## 2016-03-10 NOTE — Progress Notes (Signed)
Pre visit review using our clinic review tool, if applicable. No additional management support is needed unless otherwise documented below in the visit note. 

## 2016-03-10 NOTE — Patient Instructions (Addendum)
Start taking Prozac 20 mg: Half tablet daily for the first 7 days Then increase to one tablet daily See your psychiatrists as a schedule  Call any time or go to the ER if you have any suicidal thoughts.  Please schedule a return visit 6 weeks from today

## 2016-03-13 ENCOUNTER — Ambulatory Visit (INDEPENDENT_AMBULATORY_CARE_PROVIDER_SITE_OTHER): Payer: BLUE CROSS/BLUE SHIELD | Admitting: Neurology

## 2016-03-13 ENCOUNTER — Encounter: Payer: Self-pay | Admitting: Neurology

## 2016-03-13 VITALS — BP 126/80 | HR 64 | Resp 20 | Ht 65.0 in | Wt 205.0 lb

## 2016-03-13 DIAGNOSIS — R0683 Snoring: Secondary | ICD-10-CM

## 2016-03-13 DIAGNOSIS — R0689 Other abnormalities of breathing: Secondary | ICD-10-CM

## 2016-03-13 DIAGNOSIS — F5105 Insomnia due to other mental disorder: Secondary | ICD-10-CM

## 2016-03-13 DIAGNOSIS — F489 Nonpsychotic mental disorder, unspecified: Secondary | ICD-10-CM

## 2016-03-13 NOTE — Patient Instructions (Signed)

## 2016-03-13 NOTE — Progress Notes (Signed)
SLEEP MEDICINE CLINIC   Provider:  Melvyn Novasarmen  Tarisha Fader, M D  Referring Provider: Wanda PlumpPaz, Jose E, MD Primary Care Physician:  Willow OraJose Paz, MD  Chief Complaint  Patient presents with  . New Patient (Initial Visit)    never had sleep study, wakes up in middle of night and feels like he hasn't been breathing, maybe snores, rm 11, alone    HPI:  Shyam D Mosetta AnisLewis Jr. is a 45 y.o. male , seen here as a referral from Dr. Drue NovelPaz for a sleep consultation,   Chief complaint according to patient : " waking out of sleep air hungry"    [name] is seen here today upon his suggestion to Dr. Drue NovelPaz, his primary care physician. He describes that he has been experiencing difficulties to sleep through the night, at times difficulties to fall asleep.  In August 2016 he experienced spells that left him with extreme fatigue, he also experienced word finding difficulties and swallowing problems. A neurologic workup had been initiated but did not identify a lesion in MRI, several laboratory tests, and several physical examinations. His referring physicians were under the impression that this may be related to an anxiety disorder. However the patient has also reported numbness and tingling of both hands and legs and multiple environmental allergies. He has also blood pressure depended headaches and carries a diagnosis of irritable bowel syndrome. Some lower back panic pain, nocturia, depression were also part of his past medical history. He has an appointment scheduled with tired psychiatry for 03/30/2016.   Sleep habits are as follows: The patient has had problems to sleep since his teenage. 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 He has always been a little bit of a night owl, he was a late student- he would stay up for extra hours , to have time for himself. He has lived with his mother all his life until she passed in January 2017 and anxiety increased. At the time he had felt that he had strokelike symptoms he had also received her  diagnosis and prognosis of his mother being severely ill.  He had been known to snore, but he was worried about having also sleep apnea since he woke up sometimes gasping for air. His usual bedtime is around midnight and he rises usually in the morning around 8 AM he may wake up spontaneously at that time without an alarm. He sleeps most comfortable on his side, but he is restless. Sleeps on 2 pillows. He has not been sleeping in his own bed -but on a recliner or on the couch in his living room. I was begun about last year summer. He wasat the time concerned about sleeping in his bed, as he associated the place with poor sleep. He watches Tv in the den, and wakes up in the middle of the night , transferred to bed at 3-4 AM. Now he no longer transfers.  He takes breakfast , no coffee.   Sleep medical history and family sleep history: his mother was a sound sleeper. Father died when patient was 5111 and his sister 5114.  Social history: single, lived with mother until her death, now loved in her house. His sister lives in [place] and is childless.   Review of Systems: Out of a complete 14 system review, the patient complains of only the following symptoms, and all other reviewed systems are negative.   Epworth score 7 , Fatigue severity score 43  , depression score 2,    Social History  Social History  . Marital Status: Single    Spouse Name: N/A  . Number of Children: 0  . Years of Education: 12+   Occupational History  . Unemployed    Social History Main Topics  . Smoking status: Never Smoker   . Smokeless tobacco: Never Used  . Alcohol Use: No     Comment: 06/17/2013 "1-2 drinks/yr"  . Drug Use: No  . Sexual Activity: No   Other Topics Concern  . Not on file   Social History Narrative   No job at present,  Single,  No children.   Lives at mother's house , lost mother [name] 12-2015      Caffeine use: Coffee.tea- rare    Family History  Problem Relation Age of Onset  . Hypertension  Mother   . Stroke Mother   . Colon cancer Father 40  . Prostate cancer Neg Hx   . Diabetes Neg Hx     Past Medical History  Diagnosis Date  . IBS (irritable bowel syndrome)   . Headache(784.0)     "most days recently related to my blood pressure" (06/17/2013)  . Environmental allergies     "pretty much anything that grows, I'm allergic to it" (06/17/2013)  . 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 Hypertension     takes Losartan daily  . Weakness     both hands and left leg .Numbness and tingling  . Arthritis     lower back   . Vitamin D deficiency     takes Vit D weekly  . Nocturia   . Depression     was on Lexapro but stopped a few months ago  . Insomnia     doesn't take any meds  . Complication of anesthesia     FYI: Egg and shellfish allergies    Past Surgical History  Procedure Laterality Date  . Wisdom tooth extraction  2000's  . Colonoscopy    . Radiology with anesthesia N/A 03/02/2016    Procedure: MRI OF BRAIN W/WO    (RADIOLOGY WITH ANESTHESIA);  Surgeon: Medication Radiologist, MD;  Location: [facility] OR;  Service: Radiology;  Laterality: N/A;    Current Outpatient Prescriptions  Medication Sig Dispense Refill  . aspirin 81 MG tablet Take 81 mg by mouth daily.    Marland Kitchen FLUoxetine (PROZAC) 20 MG tablet Take 1 tablet (20 mg total) by mouth daily. 30 tablet 2  . GARLIC PO Take 1 capsule by mouth 2 (two) times daily. Reported on 02/04/2016    . losartan (COZAAR) 50 MG tablet Take 50 mg by mouth daily.  5  . OIL OF OREGANO PO Take 1 capsule by mouth 2 (two) times daily. Reported on 02/04/2016    . Omega-3 Fatty Acids (FISH OIL) 1000 MG CAPS Take by mouth.    . Vitamin D, Ergocalciferol, (DRISDOL) 50000 units CAPS capsule Take 50,000 Units by mouth every 7 (seven) days.     No current facility-administered medications for this visit.    Allergies as of 03/13/2016 - Review Complete 03/13/2016  Allergen Reaction Noted  . Eggs or egg-derived products Anaphylaxis 02/25/2016  .  Shellfish allergy Anaphylaxis 06/17/2013    Vitals: BP 126/80 mmHg  Pulse 64  Resp 20  Ht  (1.651 m)  Wt 205 lb (92.987 kg)  BMI 34.11 kg/m2 Last Weight:  Wt Readings from Last 1 Encounters:  03/13/16 205 lb (92.987 kg)   ZOX:WRUE mass index is 34.11 kg/(m^2).     Last Height:  Ht Readings from Last 1 Encounters:  03/13/16 5\' 5"  (1.651 m)    Physical exam:  General: The patient is awake, alert and appears not in acute distress. The patient is well groomed. Head: Normocephalic, atraumatic. Neck is supple. Mallampati 3,  neck circumference:17 Nasal airflow restricited . Retrognathia is seen.  Cardiovascular:  Regular rate and rhythm , without  murmurs or carotid bruit, and without distended neck veins. Respiratory: Lungs are clear to auscultation. Skin:  Without evidence of edema, or rash Trunk: BMI is elevated .   Neurologic exam : The patient is awake and alert, oriented to place and time.   Memory subjective  described as intact.  Attention span & concentration ability appears normal.  Speech is fluent,  without  dysarthria, dysphonia or aphasia.  Mood and affect are appropriate.  Cranial nerves:  Taste and smell preserved. Pupils are equal and briskly reactive to light. Funduscopic exam without evidence of pallor or edema. Extraocular movements  in vertical and horizontal planes intact and without nystagmus. Visual fields by finger perimetry are intact. Hearing to finger rub intact.   Facial sensation intact to fine touch.  Facial motor strength is symmetric and tongue and uvula move midline. Shoulder shrug was symmetrical.   Motor exam:   Normal tone, muscle bulk and symmetric strength in all extremities.  Sensory:  Fine touch, pinprick and vibration / Proprioception tested in the upper extremities was normal.  Coordination: Rapid alternating movements in the fingers/hands normal.   Gait and station: Patient walks without assistive device and is able unassisted  to climb up to the exam table. Strength within normal limits.  Stance is stable and normal.   Deep tendon reflexes: in the  \upper and lower extremities are symmetric and intact.   The patient was advised of the nature of the diagnosed sleep disorder , the treatment options and risks for general a health and wellness arising from not treating the condition.  I spent more than 35  minutes of face to face time with the patient. Greater than 50% of time was spent in counseling and coordination of care. We have discussed the diagnosis and differential and I answered the patient's questions.     Assessment:  After physical and neurologic examination, review of laboratory studies,  Personal review of imaging studies, reports of other /same  Imaging studies ,  Results of polysomnography/ neurophysiology testing and pre-existing records as far as provided in visit., my assessment is   1) OSA- snoring I will be happy to provide Mr. Eckert with a comprehensive sleep study to either confirm or rule out the existence of sleep apnea. He has himself known to snore and there is certainly at higher risk of having sleep apnea given that he has a higher body mass index, larger neck circumference, and the middle great Mallampati.  2) Insomnia;  the patients history of sleep interruptions sometimes early in the morning transferring from a sofa to another bed etc. are not necessarily indicative of sleep apnea. Early morning arousals with difficulties to return to sleep, often seen in patients with depression or other serotonin deficiencies. Sleeping better through the night can suppress be achieved by melatonin or by taking a serotonin reuptake inhibitor.  3) Somatization tendency- I will order a split night polysomnography for the patient and since he does not report any nocturnal headaches or headaches upon arousal, I will not order capnography. Hypoxemia will be monitored and if needed treated with oxygen.  If mild apnea  without oxygen desaturation would be identified or just loud snoring I would prefer for this patient to use a dental device rather than a CPAP machine.    Plan:  Treatment plan and additional workup : Please remember to try to maintain good sleep hygiene, which means: Keep a regular sleep and wake schedule, try not to exercise or have a meal within 2 hours of your bedtime, try to keep your bedroom conducive for sleep, that is, cool and dark, without light distractors such as an illuminated alarm clock, and refrain from watching TV right before sleep or in the middle of the night and do not keep the TV or radio on during the night. Also, try not to use or play on electronic devices at bedtime, such as your cell phone, tablet PC or laptop. If you like to read at bedtime on an electronic device, try to dim the background light as much as possible. Do not eat in the middle of the night.   We will request a sleep study.    We will look for leg twitching and snoring or sleep apnea.   For chronic insomnia, you are best followed by a psychiatrist and/or sleep psychologist.   We will call you with the sleep study results and make a follow up appointment if needed.    SPLIT night  , RV after study   Melvyn Novas MD  03/13/2016   CC: Wanda Plump, Md [address] [place], [place] [address]  CC Naomie Dean, MD

## 2016-03-13 NOTE — Addendum Note (Signed)
Addended by: Melvyn NovasHMEIER, Jacarie Pate on: 03/13/2016 04:21 PM   Modules accepted: Orders

## 2016-03-15 ENCOUNTER — Encounter: Payer: Self-pay | Admitting: *Deleted

## 2016-04-01 ENCOUNTER — Emergency Department (HOSPITAL_BASED_OUTPATIENT_CLINIC_OR_DEPARTMENT_OTHER)
Admission: EM | Admit: 2016-04-01 | Discharge: 2016-04-01 | Disposition: A | Discharge status: Home / Self Care | Payer: BLUE CROSS/BLUE SHIELD | Attending: Emergency Medicine | Admitting: Emergency Medicine

## 2016-04-01 ENCOUNTER — Encounter (HOSPITAL_BASED_OUTPATIENT_CLINIC_OR_DEPARTMENT_OTHER): Payer: Self-pay | Admitting: *Deleted

## 2016-04-01 DIAGNOSIS — I10 Essential (primary) hypertension: Secondary | ICD-10-CM | POA: Insufficient documentation

## 2016-04-01 DIAGNOSIS — Z7982 Long term (current) use of aspirin: Secondary | ICD-10-CM | POA: Insufficient documentation

## 2016-04-01 DIAGNOSIS — F419 Anxiety disorder, unspecified: Secondary | ICD-10-CM | POA: Diagnosis not present

## 2016-04-01 DIAGNOSIS — E559 Vitamin D deficiency, unspecified: Secondary | ICD-10-CM | POA: Diagnosis not present

## 2016-04-01 DIAGNOSIS — R519 Headache, unspecified: Secondary | ICD-10-CM

## 2016-04-01 DIAGNOSIS — F329 Major depressive disorder, single episode, unspecified: Secondary | ICD-10-CM | POA: Insufficient documentation

## 2016-04-01 DIAGNOSIS — Z79899 Other long term (current) drug therapy: Secondary | ICD-10-CM | POA: Diagnosis not present

## 2016-04-01 DIAGNOSIS — R51 Headache: Secondary | ICD-10-CM | POA: Insufficient documentation

## 2016-04-01 DIAGNOSIS — Z8719 Personal history of other diseases of the digestive system: Secondary | ICD-10-CM | POA: Diagnosis not present

## 2016-04-01 DIAGNOSIS — Z8669 Personal history of other diseases of the nervous system and sense organs: Secondary | ICD-10-CM | POA: Insufficient documentation

## 2016-04-01 DIAGNOSIS — M1990 Unspecified osteoarthritis, unspecified site: Secondary | ICD-10-CM | POA: Insufficient documentation

## 2016-04-01 LAB — COMPREHENSIVE METABOLIC PANEL
ALBUMIN: 4.3 g/dL (ref 3.5–5.0)
ALK PHOS: 74 U/L (ref 38–126)
ALT: 17 U/L (ref 17–63)
AST: 15 U/L (ref 15–41)
Anion gap: 13 (ref 5–15)
BUN: 8 mg/dL (ref 6–20)
CALCIUM: 9.4 mg/dL (ref 8.9–10.3)
CHLORIDE: 94 mmol/L — AB (ref 101–111)
CO2: 23 mmol/L (ref 22–32)
CREATININE: 0.74 mg/dL (ref 0.61–1.24)
GFR calc Af Amer: >60 mL/min (ref >60)
GFR calc non Af Amer: >60 mL/min (ref >60)
GLUCOSE: 108 mg/dL — AB (ref 65–99)
Potassium: 3.3 mmol/L — ABNORMAL LOW (ref 3.5–5.1)
SODIUM: 130 mmol/L — AB (ref 135–145)
Total Bilirubin: 1.2 mg/dL (ref 0.3–1.2)
Total Protein: 7.6 g/dL (ref 6.5–8.1)

## 2016-04-01 LAB — CBC WITH DIFFERENTIAL/PLATELET
BASOS ABS: 0 10*3/uL (ref 0.0–0.1)
Basophils Relative: 0 %
EOS ABS: 0.2 10*3/uL (ref 0.0–0.7)
EOS PCT: 2 %
HCT: 42.9 % (ref 39.0–52.0)
HEMOGLOBIN: 15.5 g/dL (ref 13.0–17.0)
LYMPHS ABS: 2 10*3/uL (ref 0.7–4.0)
Lymphocytes Relative: 20 %
MCH: 30.9 pg (ref 26.0–34.0)
MCHC: 36.1 g/dL — ABNORMAL HIGH (ref 30.0–36.0)
MCV: 85.6 fL (ref 78.0–100.0)
Monocytes Absolute: 1.1 10*3/uL — ABNORMAL HIGH (ref 0.1–1.0)
Monocytes Relative: 11 %
NEUTROS PCT: 67 %
Neutro Abs: 6.9 10*3/uL (ref 1.7–7.7)
PLATELETS: 356 10*3/uL (ref 150–400)
RBC: 5.01 MIL/uL (ref 4.22–5.81)
RDW: 12.8 % (ref 11.5–15.5)
WBC: 10.2 10*3/uL (ref 4.0–10.5)

## 2016-04-01 MED ORDER — KETOROLAC TROMETHAMINE 30 MG/ML IJ SOLN
30.0000 mg | Freq: Once | INTRAMUSCULAR | Status: AC
  Administered 2016-04-01: 30 mg via INTRAVENOUS
  Filled 2016-04-01: qty 1

## 2016-04-01 MED ORDER — MAGNESIUM SULFATE 2 GM/50ML IV SOLN
2.0000 g | Freq: Once | INTRAVENOUS | Status: AC
  Administered 2016-04-01: 2 g via INTRAVENOUS
  Filled 2016-04-01: qty 50

## 2016-04-01 MED ORDER — PROMETHAZINE HCL 25 MG PO TABS: 25.0000 mg | ORAL_TABLET | Freq: Four times a day (QID) | ORAL | Status: DC | PRN

## 2016-04-01 MED ORDER — SODIUM CHLORIDE 0.9 % IV BOLUS (SEPSIS)
1000.0000 mL | Freq: Once | INTRAVENOUS | Status: AC
  Administered 2016-04-01: 1000 mL via INTRAVENOUS

## 2016-04-01 NOTE — ED Provider Notes (Signed)
CSN: [id number]     Arrival date & time 04/01/16  0341 History   First MD Initiated Contact with Patient 04/01/16 [phone]     Chief Complaint  Patient presents with  . Headache     (Consider location/radiation/quality/duration/timing/severity/associated sxs/prior Treatment) HPI Comments: 45 year old male with past medical history including IBS, hypertension, depression/anxiety, psychogenic polydipsia who presents with headache. The patient reports that he began having a headache yesterday that moved from the right side of his head to the left side. He went to an outside hospital ED several hours ago and was evaluated including head CT which was normal. He was discharged home with reassurance but states that his headache got worse while he was at home and he became afraid that he was having strokelike symptoms. He has not taken any medications for his headache. He believes that he had a stroke last year which he self diagnosed but did not seek medical attention for until several weeks later, when he had a negative head CT. He has been following with a neurologist as an outpatient for multiple neurologic symptoms and had an MRI of his brain last month which was unremarkable and unchanged from 2014. He denies any fevers, cough/cold symptoms, vomiting, extremity weakness, or recent illness.  Patient is a 45 y.o. male presenting with headaches. The history is provided by the patient.  Headache   Past Medical History  Diagnosis Date  . IBS (irritable bowel syndrome)   . Headache(784.0)     "most days recently related to my blood pressure" (06/17/2013)  . Environmental allergies     "pretty much anything that grows, I'm allergic to it" (06/17/2013)  . 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 Hypertension     takes Losartan daily  . Weakness     both hands and left leg .Numbness and tingling  . Arthritis     lower back   . Vitamin D deficiency     takes Vit D weekly  . Nocturia   . Depression     was  on Lexapro but stopped a few months ago  . Insomnia     doesn't take any meds  . Complication of anesthesia     FYI: Egg and shellfish allergies   Past Surgical History  Procedure Laterality Date  . Wisdom tooth extraction  2000's  . Colonoscopy    . Radiology with anesthesia N/A 03/02/2016    Procedure: MRI OF BRAIN W/WO    (RADIOLOGY WITH ANESTHESIA);  Surgeon: Medication Radiologist, MD;  Location: [facility] OR;  Service: Radiology;  Laterality: N/A;   Family History  Problem Relation Age of Onset  . Hypertension Mother   . Stroke Mother   . Colon cancer Father 13  . Prostate cancer Neg Hx   . Diabetes Neg Hx    Social History  Substance Use Topics  . Smoking status: Never Smoker   . Smokeless tobacco: Never Used  . Alcohol Use: No     Comment: 06/17/2013 "1-2 drinks/yr"    Review of Systems  Neurological: Positive for headaches.   10 Systems reviewed and are negative for acute change except as noted in the HPI.    Allergies  Eggs or egg-derived products and Shellfish allergy  Home Medications   Prior to Admission medications   Medication Sig Start Date End Date Taking? Authorizing Provider  aspirin 81 MG tablet Take 81 mg by mouth daily.    Historical Provider, MD  FLUoxetine (PROZAC) 20 MG tablet Take 1 tablet (20 mg  total) by mouth daily. 03/10/16   Wanda Plump, MD  GARLIC PO Take 1 capsule by mouth 2 (two) times daily. Reported on 02/04/2016    Historical Provider, MD  losartan (COZAAR) 50 MG tablet Take 50 mg by mouth daily. 03/07/16   Historical Provider, MD  OIL OF OREGANO PO Take 1 capsule by mouth 2 (two) times daily. Reported on 02/04/2016    Historical Provider, MD  Omega-3 Fatty Acids (FISH OIL) 1000 MG CAPS Take by mouth.    Historical Provider, MD  promethazine (PHENERGAN) 25 MG tablet Take 1 tablet (25 mg total) by mouth every 6 (six) hours as needed for nausea or vomiting (at first sign of headache). 04/01/16   Laurence Spates, MD  Vitamin D, Ergocalciferol,  (DRISDOL) 50000 units CAPS capsule Take 50,000 Units by mouth every 7 (seven) days.    Historical Provider, MD   BP 129/94 mmHg  Pulse 96  Temp(Src) 98 F (36.7 C) (Oral)  Resp 13  Ht  (1.651 m)  Wt 195 lb (88.451 kg)  BMI 32.45 kg/m2  SpO2 98% Physical Exam  Constitutional: He is oriented to person, place, and time. He appears well-developed and well-nourished. No distress.  Awake, alert  HENT:  Head: Normocephalic and atraumatic.  Eyes: Conjunctivae and EOM are normal. Pupils are equal, round, and reactive to light.  Neck: Neck supple.  Cardiovascular: Normal rate, regular rhythm and normal heart sounds.   No murmur heard. Pulmonary/Chest: Effort normal and breath sounds normal. No respiratory distress.  Abdominal: Soft. Bowel sounds are normal. He exhibits no distension.  Musculoskeletal: He exhibits no edema.  Neurological: He is alert and oriented to person, place, and time. He has normal reflexes. No cranial nerve deficit. He exhibits normal muscle tone.  Fluent speech, normal finger-to-nose testing, negative pronator drift; 5/5 strength and normal sensation x all 4 ext; no clonus  Skin: Skin is warm and dry.  Psychiatric: Thought content normal.  Anxious, pressured speech, avoids eye contact  Nursing note and vitals reviewed.   ED Course  Procedures (including critical care time) Labs Review Labs Reviewed  CBC WITH DIFFERENTIAL/PLATELET - Abnormal; Notable for the following:    MCHC 36.1 (*)    Monocytes Absolute 1.1 (*)    All other components within normal limits  COMPREHENSIVE METABOLIC PANEL - Abnormal; Notable for the following:    Sodium 130 (*)    Potassium 3.3 (*)    Chloride 94 (*)    Glucose, Bld 108 (*)    All other components within normal limits    Imaging Review No results found. I have personally reviewed and evaluated these lab results as part of my medical decision-making.  Medications  ketorolac (TORADOL) 30 MG/ML injection 30 mg (30  mg Intravenous Given 04/01/16 0636)  sodium chloride 0.9 % bolus 1,000 mL (0 mLs Intravenous Stopped 04/01/16 0823)  magnesium sulfate IVPB 2 g 50 mL (0 g Intravenous Stopped 04/01/16 0740)   EKG:  Sinus tachycardia, rate 102, normal axis, no ST segment or T-wave changes to suggest acute ischemia. MDM   Final diagnoses:  Acute nonintractable headache, unspecified headache type  Anxiety   Patient presents with complaint of headache as well as multiple chronic neurologic complaints for which she follows with a neurologist in the clinic and recently had a normal outpatient MRI. He had a normal head CT at an outside hospital a few hours ago. On exam, he was awake and alert, anxious but in no acute distress.  Vital signs notable for mild hypertension. Normal neurologic exam. I reviewed outside hospital records which showed unremarkable lab work and normal head CT. His lab work here is notable only for sodium 1:30, chloride 94; normal blood counts. Patient does have a history of psychogenic polydipsia. Because the patient drove himself here, gave Toradol, magnesium, and IV fluid bolus.  On reexamination, the patient stated that he still had a headache symptoms. He states that he is concerned that he was "on the verge of having a major stroke last night." He drove himself and I am unable to give him any sedating medications. I offered oral Benadryl and Compazine to use at home but patient was concerned about Benadryl worsening his hypertension. I offered Phenergan to use in combination with Tylenol. The patient requested Xanax for his anxiety symptoms and after I declined given this medication, he requested Ativan. I instructed that he needed to follow up with his PCP regarding his anxiety symptoms. I have emphasized the importance of following up with his neurologist and PCP regarding his headache symptoms. Reviewed return precautions and patient discharged in satisfactory condition.   Laurence Spatesachel Morgan Kainan Patty,  MD 04/01/16 [phone]

## 2016-04-01 NOTE — ED Notes (Signed)
Patient stated that he had not taken his BP medication today

## 2016-04-01 NOTE — ED Notes (Signed)
20G PIV DC'd from L [facility]; catheter intact; site unremarkable.

## 2016-04-01 NOTE — ED Notes (Signed)
MD at bedside. 

## 2016-04-01 NOTE — Discharge Instructions (Signed)

## 2016-04-01 NOTE — ED Notes (Signed)
Patient is alert and oriented x4.  He is complaining of a headache that started yesterday and has moved from the right side of  His head to the left side.  Currently he rates his pain 8 of 10.  Patient states that he had a stroke last year that he self diagnosed.

## 2016-04-02 ENCOUNTER — Ambulatory Visit (HOSPITAL_COMMUNITY)
Admission: RE | Admit: 2016-04-02 | Discharge: 2016-04-02 | Disposition: A | Discharge status: Home / Self Care | Payer: BLUE CROSS/BLUE SHIELD | Attending: Psychiatry | Admitting: Psychiatry

## 2016-04-02 DIAGNOSIS — R531 Weakness: Secondary | ICD-10-CM | POA: Insufficient documentation

## 2016-04-02 DIAGNOSIS — G4700 Insomnia, unspecified: Secondary | ICD-10-CM | POA: Insufficient documentation

## 2016-04-02 DIAGNOSIS — Z9109 Other allergy status, other than to drugs and biological substances: Secondary | ICD-10-CM | POA: Diagnosis not present

## 2016-04-02 DIAGNOSIS — R51 Headache: Secondary | ICD-10-CM | POA: Insufficient documentation

## 2016-04-02 DIAGNOSIS — R351 Nocturia: Secondary | ICD-10-CM | POA: Diagnosis not present

## 2016-04-02 DIAGNOSIS — M1990 Unspecified osteoarthritis, unspecified site: Secondary | ICD-10-CM | POA: Insufficient documentation

## 2016-04-02 DIAGNOSIS — F411 Generalized anxiety disorder: Secondary | ICD-10-CM | POA: Diagnosis not present

## 2016-04-02 DIAGNOSIS — E559 Vitamin D deficiency, unspecified: Secondary | ICD-10-CM | POA: Insufficient documentation

## 2016-04-02 DIAGNOSIS — R631 Polydipsia: Secondary | ICD-10-CM | POA: Diagnosis not present

## 2016-04-02 DIAGNOSIS — I10 Essential (primary) hypertension: Secondary | ICD-10-CM | POA: Insufficient documentation

## 2016-04-02 DIAGNOSIS — K589 Irritable bowel syndrome without diarrhea: Secondary | ICD-10-CM | POA: Diagnosis not present

## 2016-04-02 DIAGNOSIS — F331 Major depressive disorder, recurrent, moderate: Secondary | ICD-10-CM | POA: Insufficient documentation
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
## 2016-04-04 ENCOUNTER — Encounter (HOSPITAL_COMMUNITY): Payer: Self-pay | Admitting: *Deleted

## 2016-04-04 ENCOUNTER — Emergency Department (HOSPITAL_COMMUNITY)
Admission: EM | Admit: 2016-04-04 | Discharge: 2016-04-05 | Disposition: A | Discharge status: Other Acute Inpatient Hospital | Payer: BLUE CROSS/BLUE SHIELD | Attending: Emergency Medicine | Admitting: Emergency Medicine

## 2016-04-04 DIAGNOSIS — Z8719 Personal history of other diseases of the digestive system: Secondary | ICD-10-CM | POA: Insufficient documentation

## 2016-04-04 DIAGNOSIS — F329 Major depressive disorder, single episode, unspecified: Secondary | ICD-10-CM | POA: Insufficient documentation

## 2016-04-04 DIAGNOSIS — Z8669 Personal history of other diseases of the nervous system and sense organs: Secondary | ICD-10-CM | POA: Diagnosis not present

## 2016-04-04 DIAGNOSIS — Z7982 Long term (current) use of aspirin: Secondary | ICD-10-CM | POA: Insufficient documentation

## 2016-04-04 DIAGNOSIS — F419 Anxiety disorder, unspecified: Secondary | ICD-10-CM | POA: Diagnosis not present

## 2016-04-04 DIAGNOSIS — F32A Depression, unspecified: Secondary | ICD-10-CM

## 2016-04-04 DIAGNOSIS — I10 Essential (primary) hypertension: Secondary | ICD-10-CM | POA: Insufficient documentation

## 2016-04-04 DIAGNOSIS — Z008 Encounter for other general examination: Secondary | ICD-10-CM | POA: Diagnosis present

## 2016-04-04 DIAGNOSIS — M47896 Other spondylosis, lumbar region: Secondary | ICD-10-CM | POA: Insufficient documentation

## 2016-04-04 DIAGNOSIS — Z79899 Other long term (current) drug therapy: Secondary | ICD-10-CM | POA: Diagnosis not present

## 2016-04-04 LAB — RAPID URINE DRUG SCREEN, HOSP PERFORMED
Amphetamines: NOT DETECTED
BARBITURATES: NOT DETECTED
BENZODIAZEPINES: NOT DETECTED
COCAINE: NOT DETECTED
Opiates: NOT DETECTED
Tetrahydrocannabinol: NOT DETECTED

## 2016-04-04 LAB — CBC
HCT: 43.9 % (ref 39.0–52.0)
Hemoglobin: 15.9 g/dL (ref 13.0–17.0)
MCH: 30.6 pg (ref 26.0–34.0)
MCHC: 36.2 g/dL — AB (ref 30.0–36.0)
MCV: 84.6 fL (ref 78.0–100.0)
Platelets: 344 10*3/uL (ref 150–400)
RBC: 5.19 MIL/uL (ref 4.22–5.81)
RDW: 13 % (ref 11.5–15.5)
WBC: 7.2 10*3/uL (ref 4.0–10.5)

## 2016-04-04 LAB — COMPREHENSIVE METABOLIC PANEL
ALK PHOS: 78 U/L (ref 38–126)
ALT: 16 U/L — ABNORMAL LOW (ref 17–63)
AST: 15 U/L (ref 15–41)
Albumin: 4.8 g/dL (ref 3.5–5.0)
Anion gap: 10 (ref 5–15)
BUN: 6 mg/dL (ref 6–20)
CALCIUM: 9.9 mg/dL (ref 8.9–10.3)
CO2: 28 mmol/L (ref 22–32)
CREATININE: 0.76 mg/dL (ref 0.61–1.24)
Chloride: 97 mmol/L — ABNORMAL LOW (ref 101–111)
Glucose, Bld: 120 mg/dL — ABNORMAL HIGH (ref 65–99)
Potassium: 4.2 mmol/L (ref 3.5–5.1)
SODIUM: 135 mmol/L (ref 135–145)
Total Bilirubin: 0.8 mg/dL (ref 0.3–1.2)
Total Protein: 8.4 g/dL — ABNORMAL HIGH (ref 6.5–8.1)

## 2016-04-04 LAB — ETHANOL: Alcohol, Ethyl (B): <5 mg/dL (ref <5)

## 2016-04-04 MED ORDER — FLUOXETINE HCL 20 MG PO TABS
20.0000 mg | ORAL_TABLET | Freq: Every day | ORAL | Status: DC
  Administered 2016-04-04 – 2016-04-05 (×2): 20 mg via ORAL
  Filled 2016-04-04 (×4): qty 1

## 2016-04-04 MED ORDER — PROMETHAZINE HCL 25 MG PO TABS: 25.0000 mg | ORAL_TABLET | Freq: Four times a day (QID) | ORAL | Status: DC | PRN

## 2016-04-04 MED ORDER — ZOLPIDEM TARTRATE 5 MG PO TABS: 5.0000 mg | ORAL_TABLET | Freq: Every evening | ORAL | Status: DC | PRN

## 2016-04-04 MED ORDER — LORAZEPAM 1 MG PO TABS: 1.0000 mg | ORAL_TABLET | Freq: Three times a day (TID) | ORAL | Status: DC | PRN

## 2016-04-04 MED ORDER — IBUPROFEN 200 MG PO TABS: 600.0000 mg | ORAL_TABLET | Freq: Three times a day (TID) | ORAL | Status: DC | PRN

## 2016-04-04 MED ORDER — ACETAMINOPHEN 325 MG PO TABS: 650.0000 mg | ORAL_TABLET | ORAL | Status: DC | PRN

## 2016-04-04 MED ORDER — LOSARTAN POTASSIUM 50 MG PO TABS
50.0000 mg | ORAL_TABLET | Freq: Every day | ORAL | Status: DC
  Administered 2016-04-04 – 2016-04-05 (×2): 50 mg via ORAL
  Filled 2016-04-04 (×2): qty 1

## 2016-04-04 NOTE — BH Assessment (Signed)
Assessment Note  Edwin Vaughan is an 45 y.o. male that reports SI with a plan to OD on pills.  Patient reports having increased thoughts of wanting to harm himself since the death of his mother in January 2017.  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  Patient reports that he has been living in her home since her death in January.   Patient reports that he has not worked in the past 5 years due to irritable bowel syndrome.  Patient reports that he has been living on his mothers investments since her death.  Patient reports that he recently started taking prizac from his PCP but it is not working.  Patient report that he still has the racing thoughts of wanting to harm himself.  Patient repots that he is only able to get 1-2 hours of sleep at night.   Patient denies HI/Psychosis/Substance Abuse.  Patient denies physical, sexual or emotional abuse.   Diagnosis: Major Depressive Disorder   Past Medical History:  Past Medical History  Diagnosis Date  . IBS (irritable bowel syndrome)   . Headache(784.0)     "most days recently related to my blood pressure" (06/17/2013)  . Environmental allergies     "pretty much anything that grows, I'm allergic to it" (06/17/2013)  . 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 Hypertension     takes Losartan daily  . Weakness     both hands and left leg .Numbness and tingling  . Arthritis     lower back   . Vitamin D deficiency     takes Vit D weekly  . Nocturia   . Depression     was on Lexapro but stopped a few months ago  . Insomnia     doesn't take any meds  . Complication of anesthesia     FYI: Egg and shellfish allergies    Past Surgical History  Procedure Laterality Date  . Wisdom tooth extraction  2000's  . Colonoscopy    . Radiology with anesthesia N/A 03/02/2016    Procedure: MRI OF BRAIN W/WO    (RADIOLOGY WITH ANESTHESIA);  Surgeon: Medication Radiologist, MD;  Location: [facility] OR;  Service: Radiology;  Laterality: N/A;     Family History:  Family History  Problem Relation Age of Onset  . Hypertension Mother   . Stroke Mother   . Colon cancer Father [id number]  . Prostate cancer Neg Hx   . Diabetes Neg Hx     Social History:  reports that he has never smoked. He has never used smokeless tobacco. He reports that he does not drink alcohol or use illicit drugs.  Additional Social History:  Alcohol / Drug Use History of alcohol / drug use?: No history of alcohol / drug abuse  CIWA: CIWA-Ar BP: 124/87 mmHg Pulse Rate: 85 COWS:    Allergies:  Allergies  Allergen Reactions  . Eggs Or Egg-Derived Products Anaphylaxis    Swelling in throat  . Shellfish Allergy Anaphylaxis    Home Medications:  (Not in a hospital admission)  OB/GYN Status:  No LMP for male patient.  General Assessment Data Location of Assessment: [facility] TTS Assessment: In system Is this a Tele or Face-to-Face Assessment?: Face-to-Face Is this an Initial Assessment or a Re-assessment for this encounter?: Initial Assessment Marital status: Single Maiden name: NA Is patient pregnant?: No Pregnancy Status: No Living Arrangements: Alone Can pt return to current living arrangement?: Yes Admission Status: Voluntary Is patient capable of signing voluntary admission?:  Yes Referral Source: Self/Family/Friend Insurance type: BCBS     Crisis Care Plan Living Arrangements: Alone Legal Guardian: Other: (NA) Name of Psychiatrist: None  Name of Therapist: [facility]  Education Status Is patient currently in school?: No Current Grade: NA Highest grade of school patient has completed: Associates degree + some college Name of school: NA Contact person: NA  Risk to self with the past 6 months Suicidal Ideation: Yes-Currently Present Has patient been a risk to self within the past 6 months prior to admission? : Yes Suicidal Intent: Yes-Currently Present Has patient had any suicidal intent within the past 6 months prior to admission? :  Yes Is patient at risk for suicide?: Yes Suicidal Plan?: Yes-Currently Present Has patient had any suicidal plan within the past 6 months prior to admission? : Yes Specify Current Suicidal Plan: Overdose on pills Access to Means: Yes Specify Access to Suicidal Means: Pills at home What has been your use of drugs/alcohol within the last 12 months?: None Reported Previous Attempts/Gestures: No How many times?: 0 Other Self Harm Risks: None Triggers for Past Attempts:  (NA) Intentional Self Injurious Behavior: None Family Suicide History: No Recent stressful life event(s): Loss (Comment) (MOther died in 01-12-2016) Persecutory voices/beliefs?: No Depression: Yes Depression Symptoms: Despondent, Insomnia, Isolating, Feeling worthless/self pity, Fatigue, Guilt, Loss of interest in usual pleasures, Tearfulness Substance abuse history and/or treatment for substance abuse?: No Suicide prevention information given to non-admitted patients: Yes  Risk to Others within the past 6 months Homicidal Ideation: No Does patient have any lifetime risk of violence toward others beyond the six months prior to admission? : No Thoughts of Harm to Others: No Current Homicidal Intent: No Current Homicidal Plan: No Access to Homicidal Means: No Identified Victim: NA History of harm to others?: No Assessment of Violence: None Noted Violent Behavior Description: None Does patient have access to weapons?: No Criminal Charges Pending?: No Does patient have a court date: No Is patient on probation?: No  Psychosis Hallucinations: None noted Delusions: None noted  Mental Status Report Appearance/Hygiene: Unremarkable Eye Contact: Good Motor Activity: Freedom of movement Speech: Logical/coherent, Soft Level of Consciousness: Alert Mood: Depressed, Anxious Affect: Constricted Anxiety Level: Minimal Thought Processes: Coherent, Relevant Judgement: Unimpaired Orientation: Person, Place, Time,  Situation Obsessive Compulsive Thoughts/Behaviors: None  Cognitive Functioning Concentration: Decreased Memory: Recent Intact, Remote Intact IQ: Average Insight: Fair Impulse Control: Fair Appetite: Fair Weight Loss: 0 Weight Gain: 0 Sleep: Decreased Total Hours of Sleep: 4 Vegetative Symptoms: Decreased grooming, Staying in bed  ADLScreening [facility] Assessment Services) Patient's cognitive ability adequate to safely complete daily activities?: Yes Patient able to express need for assistance with ADLs?: Yes Independently performs ADLs?: Yes (appropriate for developmental age)  Prior Inpatient Therapy Prior Inpatient Therapy: No Prior Therapy Dates: NA Prior Therapy Facilty/Provider(s): NA Reason for Treatment: NA  Prior Outpatient Therapy Prior Outpatient Therapy: Yes Prior Therapy Dates: Ongoing  Prior Therapy Facilty/Provider([facility]): [facility] Reason for Treatment: Anxiety Does patient have an ACCT team?: No Does patient have Intensive In-House Services?  : No Does patient have [facility] services? : No Does patient have P4CC services?: No  ADL Screening (condition at time of admission) Patient's cognitive ability adequate to safely complete daily activities?: Yes Is the patient deaf or have difficulty hearing?: No Does the patient have difficulty seeing, even when wearing glasses/contacts?: No Does the patient have difficulty concentrating, remembering, or making decisions?: No Patient able to express need for assistance with ADLs?: Yes Does the patient have difficulty dressing  or bathing?: No Independently performs ADLs?: Yes (appropriate for developmental age) Does the patient have difficulty walking or climbing stairs?: No Weakness of Legs: None Weakness of Arms/Hands: None  Home Assistive Devices/Equipment Home Assistive Devices/Equipment: None    Abuse/Neglect Assessment (Assessment to be complete while patient is alone) Physical Abuse: Denies Verbal Abuse:  Denies Sexual Abuse: Denies Exploitation of patient/patient's resources: Denies Self-Neglect: Denies Values / Beliefs Cultural Requests During Hospitalization: None Spiritual Requests During Hospitalization: None Consults Spiritual Care Consult Needed: No Social Work Consult Needed: No Merchant navy officer (For Healthcare) Does patient have an advance directive?: No Would patient like information on creating an advanced directive?: No - patient declined information    Additional Information 1:1 In Past 12 Months?: No CIRT Risk: No Elopement Risk: No Does patient have medical clearance?: No     Disposition: Per Julieanne Cotton, NP - patient meets criteria for inpatient hospitalization.  No [facility] beds at [facility].  TTS will seek placement.  Disposition Initial Assessment Completed for this Encounter: Yes Disposition of Patient: Inpatient treatment program Type of inpatient treatment program: Adult  On Site Evaluation by:   Reviewed with Physician:    Phillip Heal LaVerne 04/04/2016 8:08 PM

## 2016-04-04 NOTE — ED Notes (Signed)
Sister will be taking belongings home.

## 2016-04-04 NOTE — ED Notes (Signed)
Family took patient belongs home rn made aware

## 2016-04-04 NOTE — ED Notes (Addendum)
Pt reports he has been having stroke-like sxs since Friday.  Reports disorientation, weakness in his hands and legs.  [facility] head CT done and it was unremarkable.  He states that he is unsure why it's not showing that he's had a stroke.  States he's also had TIA's in the past.  Pt reports mild weakness in his hands.  Pt is A&Ox 4.  Pt is also here for psych eval.  Reports that he has been staying in his mother's house by himself, would not go outside, has not been to church.  He states that since his mother passed away in July last year, he has had this guilt that's made him depressed.  He is also having "spiritual guilt" that he needs to get over.  He reports that he has thought about suicide a lot but states that he would never go through with it.  His sister reports that pt has been acting different, confused at times.

## 2016-04-04 NOTE — BH Assessment (Addendum)
Per Julieanne CottonJosephine, NP - patient meets criteria for inpatient hospitalization.  [facility]

## 2016-04-04 NOTE — ED Provider Notes (Signed)
CSN: [id number]     Arrival date & time 04/04/16  0913 History   First MD Initiated Contact with Patient 04/04/16 1223     Chief Complaint  Patient presents with  . Medical Clearance     (Consider location/radiation/quality/duration/timing/severity/associated sxs/prior Treatment) HPI  Pt presenting with c/o feeling worsening anxiety and depression.  He states that his mother passed away several months ago, he lives alone and is in a constant state of anxiety.  States he cannot sleep, that he feels worried and guilty over many things. State he feels a spiritual guilt over sins in his life.  States he was started on prozac approx 1 week ago but doesn't feel it is working.  He is also concerned that he may be having a stroke- he states he knows that none of the tests have shown a stroke but he still feels that this is the case and cannot stop worrying about it.  No weakness of arms or legs, no double vision, no changes in speech.  Per prior chart review he had negative head CT 2 days ago, also had negative MRI approx 2 weeks ago- he has been seeeing a neurologist as an outpatient- with no definitive findings. He reports that he came to the ED today due to feeling that he may kill himself, states he does not want to do this but he fears he may.  There are no other associated systemic symptoms, there are no other alleviating or modifying factors.   Past Medical History  Diagnosis Date  . IBS (irritable bowel syndrome)   . Headache(784.0)     "most days recently related to my blood pressure" (06/17/2013)  . Environmental allergies     "pretty much anything that grows, I'm allergic to it" (06/17/2013)  . 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 Hypertension     takes Losartan daily  . Weakness     both hands and left leg .Numbness and tingling  . Arthritis     lower back   . Vitamin D deficiency     takes Vit D weekly  . Nocturia   . Depression     was on Lexapro but stopped a few months ago  .  Insomnia     doesn't take any meds  . Complication of anesthesia     FYI: Egg and shellfish allergies   Past Surgical History  Procedure Laterality Date  . Wisdom tooth extraction  2000's  . Colonoscopy    . Radiology with anesthesia N/A 03/02/2016    Procedure: MRI OF BRAIN W/WO    (RADIOLOGY WITH ANESTHESIA);  Surgeon: Medication Radiologist, MD;  Location: [facility] OR;  Service: Radiology;  Laterality: N/A;   Family History  Problem Relation Age of Onset  . Hypertension Mother   . Stroke Mother   . Colon cancer Father 62  . Prostate cancer Neg Hx   . Diabetes Neg Hx    Social History  Substance Use Topics  . Smoking status: Never Smoker   . Smokeless tobacco: Never Used  . Alcohol Use: No     Comment: 06/17/2013 "1-2 drinks/yr"    Review of Systems  ROS reviewed and all otherwise negative except for mentioned in HPI    Allergies  Eggs or egg-derived products and Shellfish allergy  Home Medications   Prior to Admission medications   Medication Sig Start Date End Date Taking? Authorizing Provider  aspirin 81 MG tablet Take 81 mg by mouth daily.   Yes Historical Provider, MD  FLUoxetine (PROZAC) 20 MG tablet Take 1 tablet (20 mg total) by mouth daily. 03/10/16  Yes Wanda PlumpJose E Paz, MD  losartan (COZAAR) 50 MG tablet Take 50 mg by mouth daily. 03/07/16  Yes Historical Provider, MD  promethazine (PHENERGAN) 25 MG tablet Take 1 tablet (25 mg total) by mouth every 6 (six) hours as needed for nausea or vomiting (at first sign of headache). 04/01/16   Ambrose Finlandachel Morgan Little, MD   BP 140/103 mmHg  Pulse 88  Temp(Src) 98.1 F (36.7 C) (Oral)  SpO2 97%  Vitals reviewed Physical Exam  Physical Examination: General appearance - alert, well appearing, and in no distress Mental status - alert, oriented to person, place, and time Eyes - pupils equal and reactive, extraocular eye movements intact Chest - clear to auscultation, no wheezes, rales or rhonchi, symmetric air entry Heart - normal  rate, regular rhythm, normal S1, S2, no murmurs, rubs, clicks or gallops Neurological - alert, oriented x 3, normal speech, no cranial nerve defect, no focal weakness Extremities - peripheral pulses normal, no pedal edema, no clubbing or cyanosis Skin - normal coloration and turgor, no rashes Psych- anxious, worried, but cooperative   ED Course  Procedures (including critical care time) Labs Review Labs Reviewed  COMPREHENSIVE METABOLIC PANEL - Abnormal; Notable for the following:    Chloride 97 (*)    Glucose, Bld 120 (*)    Total Protein 8.4 (*)    ALT 16 (*)    All other components within normal limits  CBC - Abnormal; Notable for the following:    MCHC 36.2 (*)    All other components within normal limits  ETHANOL  URINE RAPID DRUG SCREEN, HOSP PERFORMED    Imaging Review No results found. I have personally reviewed and evaluated these images and lab results as part of my medical decision-making.   EKG Interpretation None      MDM   Final diagnoses:  Anxiety  Depression   Pt presenting with worsening and ongoing anxiety and depression- he has had thoughts of suicide and is concerned he may act on these but does not want to.  Pt will need TTS evaluation, psych holding orders written.    2:27 PM pt is medically cleared, he does not have symptoms of acute stroke.  Labs reassuring, head CT 2 days ago at outside hospital- normal MRI 2 weeks ago- state the symptoms have been ongoing for several months.  Follows as an outpatient with neurology.  Primary concern today is for anxiety, depression and evaluation of suicidal thoughts.    Jerelyn ScottMartha Linker, MD 04/04/16 [phone]

## 2016-04-04 NOTE — ED Notes (Signed)
Bed: WA32 Expected date:  Expected time:  Means of arrival:  Comments: Triage 3 

## 2016-04-05 ENCOUNTER — Inpatient Hospital Stay (HOSPITAL_COMMUNITY)
Admission: AD | Admit: 2016-04-05 | Discharge: 2016-04-16 | DRG: 885 | Disposition: A | Discharge status: Home / Self Care | Payer: BLUE CROSS/BLUE SHIELD | Source: Intra-hospital | Attending: Psychiatry | Admitting: Psychiatry

## 2016-04-05 ENCOUNTER — Encounter (HOSPITAL_COMMUNITY): Payer: Self-pay | Admitting: *Deleted

## 2016-04-05 DIAGNOSIS — F332 Major depressive disorder, recurrent severe without psychotic features: Principal | ICD-10-CM | POA: Insufficient documentation

## 2016-04-05 DIAGNOSIS — Z823 Family history of stroke: Secondary | ICD-10-CM | POA: Diagnosis not present

## 2016-04-05 DIAGNOSIS — R45851 Suicidal ideations: Secondary | ICD-10-CM | POA: Diagnosis present

## 2016-04-05 DIAGNOSIS — F419 Anxiety disorder, unspecified: Secondary | ICD-10-CM | POA: Diagnosis not present

## 2016-04-05 DIAGNOSIS — Z818 Family history of other mental and behavioral disorders: Secondary | ICD-10-CM

## 2016-04-05 DIAGNOSIS — G4700 Insomnia, unspecified: Secondary | ICD-10-CM | POA: Diagnosis present

## 2016-04-05 DIAGNOSIS — Z91013 Allergy to seafood: Secondary | ICD-10-CM

## 2016-04-05 DIAGNOSIS — I10 Essential (primary) hypertension: Secondary | ICD-10-CM | POA: Diagnosis present

## 2016-04-05 DIAGNOSIS — Z800 Family history of malignant neoplasm of digestive organs: Secondary | ICD-10-CM | POA: Diagnosis not present

## 2016-04-05 DIAGNOSIS — E559 Vitamin D deficiency, unspecified: Secondary | ICD-10-CM | POA: Diagnosis present

## 2016-04-05 DIAGNOSIS — Z8249 Family history of ischemic heart disease and other diseases of the circulatory system: Secondary | ICD-10-CM | POA: Diagnosis not present

## 2016-04-05 DIAGNOSIS — F411 Generalized anxiety disorder: Secondary | ICD-10-CM | POA: Diagnosis present

## 2016-04-05 MED ORDER — PROMETHAZINE HCL 25 MG PO TABS: 25.0000 mg | ORAL_TABLET | Freq: Four times a day (QID) | ORAL | Status: DC | PRN

## 2016-04-05 MED ORDER — ACETAMINOPHEN 325 MG PO TABS: 650.0000 mg | ORAL_TABLET | Freq: Four times a day (QID) | ORAL | Status: DC | PRN

## 2016-04-05 MED ORDER — FLUOXETINE HCL 20 MG PO TABS
20.0000 mg | ORAL_TABLET | Freq: Every day | ORAL | Status: DC
  Administered 2016-04-06 – 2016-04-13 (×8): 20 mg via ORAL
  Filled 2016-04-05 (×10): qty 1

## 2016-04-05 MED ORDER — ALUM & MAG HYDROXIDE-SIMETH 200-200-20 MG/5ML PO SUSP: 30.0000 mL | ORAL | Status: DC | PRN

## 2016-04-05 MED ORDER — LOSARTAN POTASSIUM 50 MG PO TABS
50.0000 mg | ORAL_TABLET | Freq: Every day | ORAL | Status: DC
  Administered 2016-04-06 – 2016-04-16 (×11): 50 mg via ORAL
  Filled 2016-04-05 (×13): qty 1

## 2016-04-05 MED ORDER — MAGNESIUM HYDROXIDE 400 MG/5ML PO SUSP: 30.0000 mL | Freq: Every day | ORAL | Status: DC | PRN

## 2016-04-05 NOTE — BH Assessment (Signed)
[facility] by Dr. Arrie AranAkinayo and Nanine MeansJamison Lord, NP. Room assignment is 406-1. Support paperwork completed. Nursing report [phone]. Patient is voluntary and [name] provide transport.

## 2016-04-05 NOTE — ED Notes (Signed)
Pt pleasant on approach. Pt reports he lives alone and "going crazy." Reports his mother died in January. Pt denies SI/HI. Denies AVH. Pt reports "in recent months I can feel my brain changing because of various affects from strokes." Pt reports he has had 3 CT scans and 1 MRI that show nothing. Reports increase in anxiety and depression. Special checks q 15 mins in place for safety. Video monitoring in place.

## 2016-04-05 NOTE — Progress Notes (Addendum)
D: Patient is a 45 yo [facility].  Patient states his mother became sick in August of last year.  He believes that he had a stroke at that time.  He states, "I had a stroke and I continue to have strokes.  They have done three CT scans and a MRI and can find nothing."  Patient states he experiences mental confusion, a change in personality, different speech and excessive exhaustion.  Patient states, "I feel like the judgement of God is on me.  These thoughts come into my head."  He lives alone in his mother's home.  His mother passed away in January of this year "from a stroke."  Patient has been living with his mother his entire life and has not worked in the last 5 years due to "IBS."  Patient is currently living on the money his mother left him, however, knows this will run out.  Patient is anxious and fidgety.  He states, "I need to go to the emergency room now.  I had a stroke while I was waiting over there.  I feel dizzy and not right."  Explained to patient that only a doctor could make that call.  Patient states, "I feel like I'm losing my mind."  Patient recently saw Eyvonne MechanicLarry Willett, CSW at [facility].  He states he was awaiting an appointment with a psychiatrist.  He was taking prozac prior to admission, but does not remember the dosage.  Med hx includes HTN. He denies any tobacco, alcohol or drug use.  His UDS was negative.  He reports increased weight gain when his mother was ill, and has since lost 40 lbs.  His last CT scan was this past Friday [facility] and it was unremarkable.  Patient was having self-harm thought PTA, however, denies any at this time.  He denies any HI/AVH.  He has never been hospitalized for psychiatric issues.  Patient complains of decreased sleep, appetite, concentration.  He has not been driving and has been isolating to his home.  Patient was oriented to unit and room.

## 2016-04-05 NOTE — Tx Team (Signed)
Initial Interdisciplinary Treatment Plan   PATIENT STRESSORS: Financial difficulties Occupational concerns Traumatic event   PATIENT STRENGTHS: Average or above average intelligence Capable of independent living Communication skills General fund of knowledge Supportive family/friends   PROBLEM LIST: Problem List/Patient Goals Date to be addressed Date deferred Reason deferred Estimated date of resolution  Unresolved grief 04/05/2016     Suicidal Ideation 04/05/2016     Anxiety 04/05/2016     Possible financial issue 04/05/2016                                    DISCHARGE CRITERIA:  Improved stabilization in mood, thinking, and/or behavior Motivation to continue treatment in a less acute level of care Reduction of life-threatening or endangering symptoms to within safe limits Verbal commitment to aftercare and medication compliance  PRELIMINARY DISCHARGE PLAN: Outpatient therapy Return to previous living arrangement  PATIENT/FAMIILY INVOLVEMENT: This treatment plan has been presented to and reviewed with the patient, Edwin DickKermit D Stall.  The patient and family have been given the opportunity to ask questions and make suggestions.  Cranford MonBeaudry, Arloa Prak Evans 04/05/2016, 5:22 PM

## 2016-04-05 NOTE — ED Notes (Signed)
Pt received calm, cooperative with flat affect. Patient denies HI, A/ VH, reports anxiety 4/10, depression 6/10 and mind anxiety at this time. Pt contracts for safety, and did not want medication for sleep or anxiety at this time. Patient informed about q 15 min checks, and cameras continue monitoring for safety.

## 2016-04-05 NOTE — ED Notes (Signed)
[facility] at facility [facility] MD order. Pt signed e-signature. Pt had no personal belongings at transfer. Pt ambulatory off unit with [facility].

## 2016-04-06 ENCOUNTER — Encounter (HOSPITAL_COMMUNITY): Payer: Self-pay | Admitting: Psychiatry

## 2016-04-06 MED ORDER — BISACODYL 10 MG RE SUPP
10.0000 mg | Freq: Once | RECTAL | Status: AC
  Administered 2016-04-06: 10 mg via RECTAL
  Filled 2016-04-06 (×2): qty 1

## 2016-04-06 MED ORDER — LORAZEPAM 0.5 MG PO TABS
0.5000 mg | ORAL_TABLET | Freq: Four times a day (QID) | ORAL | Status: DC | PRN
  Administered 2016-04-08 – 2016-04-15 (×5): 0.5 mg via ORAL
  Filled 2016-04-06 (×5): qty 1

## 2016-04-06 MED ORDER — ENSURE ENLIVE PO LIQD
237.0000 mL | Freq: Two times a day (BID) | ORAL | Status: DC
Start: 2016-04-06 — End: 2016-04-11
  Administered 2016-04-08: 237 mL via ORAL

## 2016-04-06 MED ORDER — TRAZODONE HCL 100 MG PO TABS
100.0000 mg | ORAL_TABLET | Freq: Every day | ORAL | Status: DC
  Filled 2016-04-06 (×3): qty 1

## 2016-04-06 MED ORDER — LORAZEPAM 1 MG PO TABS
1.0000 mg | ORAL_TABLET | Freq: Once | ORAL | Status: AC
  Administered 2016-04-06: 1 mg via ORAL
  Filled 2016-04-06: qty 1

## 2016-04-06 MED ORDER — DOCUSATE SODIUM 100 MG PO CAPS
100.0000 mg | ORAL_CAPSULE | Freq: Every day | ORAL | Status: DC
  Administered 2016-04-08 – 2016-04-16 (×9): 100 mg via ORAL
  Filled 2016-04-06 (×13): qty 1

## 2016-04-06 NOTE — Tx Team (Signed)
Interdisciplinary Treatment Plan Update (Adult)  Date:  04/06/2016 Time Reviewed:  11:10 AM  Progress in Treatment: Attending groups: Yes. Participating in groups: Yes. Taking medication as prescribed:  Yes. Tolerating medication:  Yes. Family/Significant othe contact made:  Yes, CSW has contacted sister  Lisette Grinder (sister) [phone] Patient understands diagnosis:  Yes, as evidenced by seeking help with depression and anxiety. Discussing patient identified problems/goals with staff:  Yes, see initial care plan. Medical problems stabilized or resolved:  Yes Denies suicidal/homicidal ideation: Yes. Issues/concerns per patient self-inventory: No. Other:  New problem(s) identified:   Discharge Plan or Barriers: Patient plans to return to previous living situation to follow up with outpatient services.   Reason for Continuation of Hospitalization: Anxiety Depression Medication stabilization Suicidal ideation  Comments: Pt presenting with c/o feeling worsening anxiety and depression. He states that his mother passed away several months ago, he lives alone and is in a constant state of anxiety. States he cannot sleep, that he feels worried and guilty over many things. State he feels a spiritual guilt over sins in his life. States he was started on prozac approx 1 week ago but doesn't feel it is working. He is also concerned that he may be having a stroke- he states he knows that none of the tests have shown a stroke but he still feels that this is the case and cannot stop worrying about it. No weakness of arms or legs, no double vision, no changes in speech. Per prior chart review he had negative head CT 2 days ago, also had negative MRI approx 2 weeks ago- he has been seeeing a neurologist as an outpatient- with no definitive findings. He reports that he came to the ED today due to feeling that he may kill himself, states he does not want to do this but he fears he may. There are no  other associated systemic symptoms, there are no other alleviating or modifying factors. Prozac trial  Estimated length of stay: 2-3 days  New goal(s):  Review of initial/current patient goals per problem list:  1. Goal(s): Patient will participate in aftercare plan  Met:Yes  Target date: at discharge  As evidenced by: Patient will participate within aftercare plan AEB aftercare provider and housing plan at discharge being identified.   04/06/16: Pt will return home and follow-up outpt with [facility].  2. Goal (s): Patient will exhibit decreased depressive symptoms and suicidal ideations.  Met:No  Target date: at discharge  As evidenced by: Patient will utilize self rating of depression at 3 or below and demonstrate decreased signs of depression or be deemed stable for discharge by MD.  04/06/16: Pt reports passive SI with a plan. 4/28: Patient rates depression at 9, denies SI.  3. Goal(s): Patient will demonstrate decreased signs and symptoms of anxiety.  Met:No   Target date: at discharge  As evidenced by: Patient will utilize self rating of anxiety at 3 or below and demonstrated decreased signs of anxiety, or be deemed stable for discharge by MD  04/06/16: Pt reports high levels of anxiety and is very fixated on his reoccurring strokes. Has had several MRIs that are normal, but pt's anxiety is still high.  4/28: Patient rates anxiety at 10.  Attendees: Patient:  04/06/2016 11:10 AM  Family:   04/06/2016 11:10 AM  Physician:  Dr. Ursula Alert, MD 04/06/2016 11:10 AM  Nursing: Grayland Ormond, RN  04/06/2016 11:10 AM  Case Manager:  Roque Lias, LCSW 04/06/2016 11:10 AM  Counselor:  Jeani Hawking  Marshell Levan, MSW Intern 04/06/2016 11:10 AM  Other:   04/06/2016 11:10 AM  Other:   04/06/2016 11:10 AM  Other:   04/06/2016 11:10 AM  Other:  04/06/2016 11:10 AM  Other:    Other:    Other:    Other:    Other:    Other:      Scribe for Treatment Team:   Georga Kaufmann, MSW Intern  04/06/2016 11:10 AM

## 2016-04-06 NOTE — Progress Notes (Signed)
NUTRITION ASSESSMENT  Pt identified as at risk on the Malnutrition Screen Tool  INTERVENTION: 1. Supplements: Ensure Enlive po BID, each supplement provides 350 kcal and 20 grams of protein  NUTRITION DIAGNOSIS: Unintentional weight loss related to sub-optimal intake as evidenced by pt report.   Goal: Pt to meet >/= 90% of their estimated nutrition needs.  Monitor:  PO intake  Assessment:  Pt admitted with depression. Pt reports decreased appetite. Per weight history, pt has lost 35  lb since  February (15% wt loss x 2 months, significant for time frame). Pt would benefit from nutritional supplementation. RD to order Ensure supplements.  Height: Ht Readings from Last 1 Encounters:  04/05/16 5\' 5"  (1.651 m)    Weight: Wt Readings from Last 1 Encounters:  04/05/16 195 lb (88.451 kg)    Weight Hx: Wt Readings from Last 10 Encounters:  04/05/16 195 lb (88.451 kg)  04/01/16 195 lb (88.451 kg)  03/13/16 205 lb (92.987 kg)  03/10/16 205 lb 2 oz (93.044 kg)  03/10/16 204 lb 6.4 oz (92.715 kg)  03/02/16 210 lb (95.255 kg)  03/01/16 210 lb (95.255 kg)  03/01/16 208 lb 6.4 oz (94.53 kg)  02/04/16 213 lb 8 oz (96.843 kg)  01/17/16 230 lb 12.8 oz (104.69 kg)    BMI:  Body mass index is 32.45 kg/(m^2). Pt meets criteria for obesity based on current BMI.  Estimated Nutritional Needs: Kcal: 25-30 kcal/kg Protein: > 1 gram protein/kg Fluid: 1 ml/kcal  Diet Order:   Pt is also offered choice of unit snacks mid-morning and mid-afternoon.  Pt is eating as desired.   Lab results and medications reviewed.   Tilda FrancoLindsey Machel Violante, MS, RD, LDN Pager: [phone] After Hours Pager: [phone]

## 2016-04-06 NOTE — [facility] Suicide Risk Assessment (Signed)
[facility] INPATIENT:  Family/Significant Other Suicide Prevention Education  Suicide Prevention Education:  Education Completed; Edwin Vaughan (sister) [phone],  (name of family member/significant other) has been identified by the patient as the family member/significant other with whom the patient will be residing, and identified as the person(s) who will aid the patient in the event of a mental health crisis (suicidal ideations/suicide attempt).  With written consent from the patient, the family member/significant other has been provided the following suicide prevention education, prior to the and/or following the discharge of the patient.  The suicide prevention education provided includes the following:  Suicide risk factors  Suicide prevention and interventions  National Suicide Hotline telephone number  [facility] assessment telephone number  [facility] Assistance 911  [facility] and/or Residential Mobile Crisis Unit telephone number  Request made of family/significant other to:  Remove weapons (e.g., guns, rifles, knives), all items previously/currently identified as safety concern.    Remove drugs/medications (over-the-counter, prescriptions, illicit drugs), all items previously/currently identified as a safety concern.  The family member/significant other verbalizes understanding of the suicide prevention education information provided.  The family member/significant other agrees to remove the items of safety concern listed above.  Pt's sister agreed to go over to pt's home today and remove the guns from the house.  Edwin JordanLynn B Koltin Vaughan 04/06/2016, 3:30 PM

## 2016-04-06 NOTE — Progress Notes (Signed)
Edwin Vaughan who goes by "[name]" rates Depression 7/10 and Anxiety 6/10. He did not have a goal for today and says today was tiring. He did not attend group. He remained in his room most of the evening. He has been minimally interactive and isolative. Denies SI/HI/AVH. Encouragement and support given. Medications administered as prescribed. Continue to monitor Q 15 minutes for patient safety and medication effectiveness.
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
## 2016-04-06 NOTE — Progress Notes (Signed)
:  D:  Patient denied SI and HI, contracts for safety.  Denied A/V hallucinations.s  Denied pain. A:  Medications administered per MD orders.  Emotional support and encouragement given patient. R:  Safety maintained with 15 minute checks.

## 2016-04-06 NOTE — H&P (Signed)
Psychiatric Admission Assessment Adult  Patient Identification: Edwin Vaughan  MRN:  [id number]  Date of Evaluation:  04/06/2016  Chief Complaint:  Suicidal thoughts with plans to overdose Principal Diagnosis: Major depressive disorder, recurrent episodes  Diagnosis:   Patient Active Problem List   Diagnosis Date Noted  . MDD (major depressive disorder), recurrent episode, severe ([place]) [F33.2] 04/05/2016  . 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 Snoring [R06.83] 03/13/2016  . Gasping for breath [R06.89] 03/13/2016  . Fatigue [R53.83] 03/01/2016  . PCP NOTES >>>>>>>>>>>>>>>>>>>>>>>>>>>>>>>. [Z09] 02/06/2016  . Depression [F32.9] 01/18/2016  . Generalized anxiety disorder [F41.1] 01/18/2016  . Left-sided weakness [M62.89] 01/17/2016  . Word finding difficulty [R47.89] 01/17/2016  . Blurred vision [H53.8] 01/17/2016  . Swallowing difficulty [R13.10] 01/17/2016  . Annual physical exam [O24.23] 08/10/2015  . Shellfish allergy [Z91.013] 08/10/2015  . Hypertension [I10] 08/05/2015  . Scrotal mass [N50.9] 08/19/2013  . 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 Anxiety [F41.9] 06/22/2013  . Insomnia [G47.00] 06/22/2013  . IBS (irritable bowel syndrome) [K58.9] 06/22/2013  . Neck mass [R22.1] 07/01/2012  . Contact dermatitis [L25.9] 03/13/2011   History of Present Illness: Edwin Vaughan is a 45 year old Caucasian male. This is his first psychiatric admission to this hospital. Admitted from the [facility] with complaints of suicidal ideations with plans to overdose. During this assessment, Galvin reports, "My sister drove me to the hospital the day before yesterday. My problems started last August. I was told by the doctors who were treating my mother that she (y mother) will pass in 6 months. The news struck me hard because my mother is my caretaker & only support system. I started neglecting myself. I gained a lot of weight, my high blood pressure got  bad. I think I had a stroke because I woke up one morning, my brain was not working right, my speech became slurred, I had problems swallowing. I went to the ED a couple of days later, I got discharged because they could not find anything wrong. I had a CT Scan done November, 2016 with no definite result. Then, mom passed away 16-Jan-2016. I found myself alone. My depression got worse. I have had several mini strokes since then. The 3 separate MRI & CT scans were all negative. I know now that my mental state has a lot to do with what is going on with me. My life is basically empty. I have no job, no friends, no support system. I self-isolate myself. 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 I don't abuse drugs. I don't drink or smoke cigarettes. Sleep for me is horrible. I'm constipated. I started Prozac about 2 weeks ago. I have a pending appointment with [facility] for counseling services"  Associated Signs/Symptoms:  Depression Symptoms:  depressed mood, anhedonia, feelings of worthlessness/guilt, anxiety,  (Hypo) Manic Symptoms:  Reports racing thoughts  Anxiety Symptoms:  Excessive Worry,  Psychotic Symptoms:  Denies any hallucinations, delusional thoughts or paranoia  PTSD Symptoms: Denies  Total Time spent with patient: 1 hour  Past Psychiatric History: Major depression, Pornographic addiction  Is the patient at risk to self? No.  Has the patient been a risk to self in the past 6 months? No.  Has the patient been a risk to self within the distant past? No.  Is the patient a risk to others? No.  Has the patient been a risk to others in the past 6  months? No.  Has the patient been a risk to others within the distant past? No.   Prior Inpatient Therapy: Denies Prior Outpatient Therapy: Has a pending appointment date at [facility]: 1. How often do you have a drink containing alcohol?: Never 9. Have you or someone else been  injured as a result of your drinking?: No 10. Has a relative or friend or a doctor or another health worker been concerned about your drinking or suggested you cut down?: No Alcohol Use Disorder Identification Test Final Score (AUDIT): 0 Brief Intervention: AUDIT score less than 7 or less-screening does not suggest unhealthy drinking-brief intervention not indicated  Substance Abuse History in the last 12 months:  No.  Consequences of Substance Abuse: Denies any substance abuse or history  Previous Psychotropic Medications: Yes (Prozac)   Psychological Evaluations: Yes   Past Medical History:  Past Medical History  Diagnosis Date  . IBS (irritable bowel syndrome)   . Headache(784.0)     "most days recently related to my blood pressure" (06/17/2013)  . Environmental allergies     "pretty much anything that grows, I'm allergic to it" (06/17/2013)  . 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 Hypertension     takes Losartan daily  . Weakness     both hands and left leg .Numbness and tingling  . Arthritis     lower back   . Vitamin D deficiency     takes Vit D weekly  . Nocturia   . Depression     was on Lexapro but stopped a few months ago  . Insomnia     doesn't take any meds  . Complication of anesthesia     FYI: Egg and shellfish allergies    Past Surgical History  Procedure Laterality Date  . Wisdom tooth extraction  2000's  . Colonoscopy    . Radiology with anesthesia N/A 03/02/2016    Procedure: MRI OF BRAIN W/WO    (RADIOLOGY WITH ANESTHESIA);  Surgeon: Medication Radiologist, MD;  Location: [place];  Service: Radiology;  Laterality: N/A;   Family History:  Family History  Problem Relation Age of Onset  . Hypertension Mother   . Stroke Mother   . Colon cancer Father 45  . Prostate cancer Neg Hx   . Diabetes Neg Hx    Family Psychiatric  History: Anxiety disorder: Sister  Tobacco Screening: Denies any use of cigarettes or tobacco products.  Social History:  History   Alcohol Use No    Comment: 06/17/2013 "1-2 drinks/yr"     History  Drug Use No    Additional Social History: Marital status: Single (Pt reports that he hasn't dated in 20 years. 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 This has had a negative affect on his relationships.) Does patient have children?: No   Allergies:   Allergies  Allergen Reactions  . Eggs Or Egg-Derived Products Anaphylaxis    Swelling in throat  . Shellfish Allergy Anaphylaxis   Lab Results:  Results for orders placed or performed during the hospital encounter of 04/04/16 (from the past 48 hour(s))  Comprehensive metabolic panel     Status: Abnormal   Collection Time: 04/04/16 11:49 AM  Result Value Ref Range   Sodium 135 135 - 145 mmol/L   Potassium 4.2 3.5 - 5.1 mmol/L   Chloride 97 (L) 101 - 111 mmol/L   CO2 28 22 - 32 mmol/L   Glucose,  Bld 120 (H) 65 - 99 mg/dL   BUN 6 6 - 20 mg/dL   Creatinine, Ser 0.76 0.61 - 1.24 mg/dL   Calcium 9.9 8.9 - 10.3 mg/dL   Total Protein 8.4 (H) 6.5 - 8.1 g/dL   Albumin 4.8 3.5 - 5.0 g/dL   AST 15 15 - 41 U/L   ALT 16 (L) 17 - 63 U/L   Alkaline Phosphatase 78 38 - 126 U/L   Total Bilirubin 0.8 0.3 - 1.2 mg/dL   GFR calc non Af Amer >60 >60 mL/min   GFR calc Af Amer >60 >60 mL/min    Comment: (NOTE) The eGFR has been calculated using the CKD EPI equation. This calculation has not been validated in all clinical situations. eGFR's persistently <60 mL/min signify possible Chronic Kidney Disease.    Anion gap 10 5 - 15  Ethanol (ETOH)     Status: None   Collection Time: 04/04/16 11:49 AM  Result Value Ref Range   Alcohol, Ethyl (B) <5 <5 mg/dL    Comment:        LOWEST DETECTABLE LIMIT FOR SERUM ALCOHOL IS 5 mg/dL FOR MEDICAL PURPOSES ONLY   CBC     Status: Abnormal   Collection Time: 04/04/16 11:49 AM  Result Value Ref Range   WBC 7.2 4.0 - 10.5 K/uL   RBC 5.19 4.22 - 5.81 MIL/uL   Hemoglobin 15.9 13.0 - 17.0 g/dL   HCT  43.9 39.0 - 52.0 %   MCV 84.6 78.0 - 100.0 fL   MCH 30.6 26.0 - 34.0 pg   MCHC 36.2 (H) 30.0 - 36.0 g/dL   RDW 13.0 11.5 - 15.5 %   Platelets 344 150 - 400 K/uL  Urine rapid drug screen (hosp performed) (Not at [facility])     Status: None   Collection Time: 04/04/16 11:50 AM  Result Value Ref Range   Opiates NONE DETECTED NONE DETECTED   Cocaine NONE DETECTED NONE DETECTED   Benzodiazepines NONE DETECTED NONE DETECTED   Amphetamines NONE DETECTED NONE DETECTED   Tetrahydrocannabinol NONE DETECTED NONE DETECTED   Barbiturates NONE DETECTED NONE DETECTED    Comment:        DRUG SCREEN FOR MEDICAL PURPOSES ONLY.  IF CONFIRMATION IS NEEDED FOR ANY PURPOSE, NOTIFY LAB WITHIN 5 DAYS.        LOWEST DETECTABLE LIMITS FOR URINE DRUG SCREEN Drug Class       Cutoff (ng/mL) Amphetamine      1000 Barbiturate      200 Benzodiazepine   865 Tricyclics       784 Opiates          300 Cocaine          300 THC              50    Blood Alcohol level:  Lab Results  Component Value Date   ETH <5 04/04/2016   ETH <11 69/62/9528   Metabolic Disorder Labs:  Lab Results  Component Value Date   HGBA1C 5.8* 02/04/2016   MPG 120* 02/04/2016   No results found for: PROLACTIN Lab Results  Component Value Date   CHOL 146 08/10/2015   TRIG 190.0* 08/10/2015   HDL 30.00* 08/10/2015   CHOLHDL 5 08/10/2015   VLDL 38.0 08/10/2015   LDLCALC 78 08/10/2015   Current Medications: Current Facility-Administered Medications  Medication Dose Route Frequency Provider Last Rate Last Dose  . acetaminophen (TYLENOL) tablet 650 mg  650 mg Oral Q6H PRN  Delfin Gant, NP      . alum & mag hydroxide-simeth (MAALOX/MYLANTA) 200-200-20 MG/5ML suspension 30 mL  30 mL Oral Q4H PRN Delfin Gant, NP      . FLUoxetine (PROZAC) tablet 20 mg  20 mg Oral Daily Delfin Gant, NP   20 mg at 04/06/16 0829  . losartan (COZAAR) tablet 50 mg  50 mg Oral Daily Delfin Gant, NP   50 mg at 04/06/16 0829  .  magnesium hydroxide (MILK OF MAGNESIA) suspension 30 mL  30 mL Oral Daily PRN Delfin Gant, NP      . promethazine (PHENERGAN) tablet 25 mg  25 mg Oral Q6H PRN Delfin Gant, NP       PTA Medications: Prescriptions prior to admission  Medication Sig Dispense Refill Last Dose  . aspirin 81 MG tablet Take 81 mg by mouth daily.   04/03/2016 at evening  . FLUoxetine (PROZAC) 20 MG tablet Take 1 tablet (20 mg total) by mouth daily. 30 tablet 2 04/03/2016 at Unknown time  . losartan (COZAAR) 50 MG tablet Take 50 mg by mouth daily.  5 04/03/2016 at Unknown time  . promethazine (PHENERGAN) 25 MG tablet Take 1 tablet (25 mg total) by mouth every 6 (six) hours as needed for nausea or vomiting (at first sign of headache). 5 tablet 0 hasnt started   Musculoskeletal: Strength & Muscle Tone: within normal limits Gait & Station: normal Patient leans: N/A  Psychiatric Specialty Exam: Physical Exam  Constitutional: He is oriented to person, place, and time. He appears well-developed.  HENT:  Head: Normocephalic.  Eyes: Pupils are equal, round, and reactive to light.  Neck: Normal range of motion.  Cardiovascular:  Hx. HTN  Respiratory: Effort normal.  GI: Soft.  Genitourinary:  Denies any issues in this area  Musculoskeletal: Normal range of motion.  Neurological: He is alert and oriented to person, place, and time.  Skin: Skin is warm and dry.  Psychiatric: His speech is normal and behavior is normal. Judgment and thought content normal. His mood appears anxious. His affect is not angry, not blunt, not labile and not inappropriate. Cognition and memory are normal. He exhibits a depressed mood.    Review of Systems  Constitutional: Negative.   HENT: Negative.   Eyes: Negative.   Respiratory: Negative.   Cardiovascular: Negative for chest pain, palpitations, orthopnea, claudication, leg swelling and PND.       Hx. HTN  Gastrointestinal: Negative.   Genitourinary: Negative.    Musculoskeletal: Negative.   Skin: Negative.   Neurological: Negative.   Endo/Heme/Allergies: Negative.   Psychiatric/Behavioral: Positive for depression. Negative for suicidal ideas, hallucinations, memory loss and substance abuse. The patient is nervous/anxious and has insomnia.     Blood pressure 132/98, pulse 115, temperature 98.3 F (36.8 C), temperature source Oral, resp. rate 16, height _0  (1.651 m), weight 88.451 kg (195 lb).Body mass index is 32.45 kg/(m^2).  General Appearance: Disheveled  Eye Sport and exercise psychologist::  Fair  Speech:  Clear and Coherent and Normal Rate  Volume:  Normal  Mood:  Anxious and Depressed, rates anxiety #9 & depression #7  Affect:  Restricted  Thought Process:  Circumstantial and Tangential  Orientation:  Full (Time, Place, and Person)  Thought Content:  Rumination, denies any hallucinations or paranoia  Suicidal Thoughts:  Denies  Homicidal Thoughts:  Denies  Memory:  Immediate;   Good Recent;   Good Remote;   Good  Judgement:  Fair  Insight:  Fair  Psychomotor Activity:  Decreased  Concentration:  Fair  Recall:  [employer] of Knowledge:Fair  Language: Good  Akathisia:  Negative  Handed:  Right  AIMS (if indicated):     Assets:  Desire for Improvement  ADL's:  Intact  Cognition: WNL  Sleep:  Number of Hours: 5.5   Treatment Plan/Recommendations: 1. Admit for crisis management and stabilization, estimated length of stay 3-5 days.  2. Medication management to reduce current symptoms to base line and improve the patient's overall level of functioning;  Prozac 20 mg for depression, Lorazepam 0.5 mg for anxiety, Trazodoen 100 mg for insomnia, Cozaar 50 mg for HTN 3. Treat health problems as indicated.  4. Develop treatment plan to decrease risk of relapse upon discharge and the need for readmission.  5. Psycho-social education regarding relapse prevention and self care.  6. Health care follow up as needed for medical problems.  7. Review, reconcile,  and reinstate any pertinent home medications for other health issues where appropriate. 8. Call for consults with hospitalist for any additional specialty patient care services as needed.  Observation Level/Precautions:  15 minute checks  Laboratory:  Per ED  Psychotherapy: Group milieu  Medications: Prozac 20 mg for depression, Lorazepam 0.5 mg for anxiety, Trazodoen 100 mg for insomnia, Cozaar 50 mg for HTN  Consultations: As Needed  Discharge Concerns: safety, mood stability  Estimated LOS: 3-5 days  Other: Admit to [facility].   I certify that inpatient services furnished can reasonably be expected to improve the patient's condition.    Encarnacion Slates, NP 4/27/201711:48 AM Patient case reviewed with  NP  and patient seen by me  Agree with NP note and assessment  45 year old single male. Unemployed, lives alone, lives off inheritance from mother, who passed away last year Reports Gradually worsening depression, anxiety, Social isolation, " physical and mental exhaustion", and increasing somatic concerns, To include worsening IBS symptoms and vague CNS symptoms consisting of feeling he has decreased memory and a sense of brain fog . Reports he has had intermittent weakness on both hands and legs . He states he feels he has been having " strokes ".He has seen a neurologist, and states she was reassuring and told him there was no identifiable pathology. States he has had several Head CT scans and a Head MRI and states he has been told " everything shows up as normal".  He has been dealing with significant stressors, mainly death of mother earlier this year, who was his closest support .  Presented to ED due to concerns about having CVAs, and also reports worsening depression and some suicidal ideations, with recent thoughts of shooting self .  Psychiatric history- reports history of depression, anxiety, denies any clear history of hypomania or mania , denies prior psychiatric  admissions, no history of suicide attempts, in the past has been on Prozac , does not remember having side effects. Was restarted on it about two weeks ago, by PCP.  Denies alcohol abuse, denies drug abuse. Describes " obsessive " over eating and pornography watching behaviors in the past  Dx- MDD, Anxiety Disorder  Plan - Inpatient admission. Continue Prozac 20 mgrs QDAY at this time. Continue Losartan for HTN. Ativan PRNS for severe anxiety, as needed

## 2016-04-06 NOTE — Progress Notes (Signed)
   D: Pt was laying in bed during the assessment. Informed the writer that he wanted to come in for meds and "to be safe mentally and physically. Write discussed meds with the pt, who said he "didn't want to take a lot of meds".   A:  Support and encouragement was offered. 15 min checks continued for safety.  R: Pt remains safe.

## 2016-04-06 NOTE — [facility] Group Notes (Signed)
[facility] LCSW Group Therapy 04/06/2016 1:15 PM Type of Therapy: Group Therapy Participation Level: Active  Participation Quality: Attentive, Sharing and Supportive  Affect: Depressed and Flat  Cognitive: Alert and Oriented  Insight: Developing/Improving and Engaged  Engagement in Therapy: Developing/Improving and Engaged  Modes of Intervention: Activity, Clarification, Confrontation, Discussion, Education, Exploration, Limit-setting, Orientation, Problem-solving, Rapport Building, Reality Testing, Socialization and Support  Summary of Progress/Problems: Patient was attentive and engaged with speaker from [employer]. Patient was attentive to speaker while they shared their story of dealing with mental health and overcoming it. Patient expressed interest in their programs and services and received information on their agency. Patient processed ways they can relate to the speaker.   Cadarius Nevares, LCSW Clinical Social Worker  [facility] [phone]     

## 2016-04-06 NOTE — [facility] Suicide Risk Assessment (Signed)
[facility] Admission Suicide Risk Assessment   Nursing information obtained from:   patient and chart  Demographic factors:   45 year old single male, lives alone  Current Mental Status:   see below  Loss Factors:   limited support network, lives alone, unemployed, mother passed away last year  Historical Factors:   history of depression, anxiety  Risk Reduction Factors:   resilience , sister is supportive   Total Time spent with patient: 45 minutes Principal Problem: MDD , Anxiety Disorder  Diagnosis:   Patient Active Problem List   Diagnosis Date Noted  . MDD (major depressive disorder), recurrent episode, severe (HCC) [F33.2] 04/05/2016  . 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 Snoring [R06.83] 03/13/2016  . Gasping for breath [R06.89] 03/13/2016  . Fatigue [R53.83] 03/01/2016  . PCP NOTES >>>>>>>>>>>>>>>>>>>>>>>>>>>>>>>. [Z09] 02/06/2016  . Depression [F32.9] 01/18/2016  . Generalized anxiety disorder [F41.1] 01/18/2016  . Left-sided weakness [M62.89] 01/17/2016  . Word finding difficulty [R47.89] 01/17/2016  . Blurred vision [H53.8] 01/17/2016  . Swallowing difficulty [R13.10] 01/17/2016  . Annual physical exam [Z00.00] 08/10/2015  . Shellfish allergy [Z91.013] 08/10/2015  . Hypertension [I10] 08/05/2015  . Scrotal mass [N50.9] 08/19/2013  . 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 Anxiety [F41.9] 06/22/2013  . Insomnia [G47.00] 06/22/2013  . IBS (irritable bowel syndrome) [K58.9] 06/22/2013  . Neck mass [R22.1] 07/01/2012  . Contact dermatitis [L25.9] 03/13/2011     Continued Clinical Symptoms:  Alcohol Use Disorder Identification Test Final Score (AUDIT): 0 The "Alcohol Use Disorders Identification Test", Guidelines for Use in Primary Care, Second Edition.  World Science writer [facility]). Score between 0-7:  no or low risk or alcohol related problems. Score between 8-15:  moderate risk of alcohol related problems. Score between 16-19:   high risk of alcohol related problems. Score 20 or above:  warrants further diagnostic evaluation for alcohol dependence and treatment.   CLINICAL FACTORS:  45 year old single male. Unemployed, lives alone, lives off inheritance from mother, who passed away last year Reports  Gradually worsening depression, anxiety,  Social isolation, " physical and mental exhaustion", and increasing somatic concerns,  To include worsening IBS symptoms and vague CNS symptoms consisting of feeling he has decreased memory and a sense of brain fog . Reports he has had intermittent weakness on both hands and legs . He  states he feels he has been having "  strokes ".He has seen a neurologist, and states she was reassuring and told him there was no identifiable pathology.  States he has had several Head CT scans and a Head MRI and states he has been told " everything shows up as normal".  He has been dealing with significant stressors, mainly death of mother earlier this year, who was his closest support .  Presented to ED due to concerns about having CVAs, and also reports worsening depression and some suicidal ideations, with  recent thoughts of shooting self .  Psychiatric history- reports history of depression, anxiety, denies any clear history of hypomania or mania ,  denies prior psychiatric admissions, no history of suicide attempts,  in the past has been on Prozac , does not remember having side effects. Was restarted on it about two weeks ago, by PCP.  Denies alcohol abuse, denies drug abuse. Describes " obsessive "  over eating and pornography watching behaviors in the past  Dx- MDD, Anxiety Disorder  Plan - Inpatient admission. Continue Prozac 20 mgrs QDAY at this time. Continue Losartan  for HTN.  Ativan PRNS for severe anxiety, as needed     Musculoskeletal: Strength & Muscle Tone: within normal limits Gait & Station: normal Patient leans: N/A  Psychiatric Specialty Exam: ROS  Blood pressure 132/98,  pulse 115, temperature 98.3 F (36.8 C), temperature source Oral, resp. rate 16, height 5\' 5"  (1.651 m), weight 195 lb (88.451 kg).Body mass index is 32.45 kg/(m^2).  General Appearance: Fairly Groomed  Patent attorneyye Contact::  Good  Speech:  Normal Rate  Volume:  Normal  Mood:  Depressed  Affect:  anxious   Thought Process:  Linear  Orientation:  Other:  fully alert and attentive - oriented x 3.  Thought Content:  denies hallucinations, no delusions   Suicidal Thoughts:  No  Homicidal Thoughts:  No- denies any violent or homicidal ideations   Memory:  3/3 immediate, 3/3 at 3 minutes   Judgement:  Fair  Insight:  Fair  Psychomotor Activity:  Normal  Concentration:  Good  Recall:  Good  3/3 immediate,  Able to spell world backwards without difficulty  Fund of Knowledge:Good  Language: Good  Akathisia:  No  Handed:  Right  AIMS (if indicated):     Assets:  Communication Skills Desire for Improvement  Sleep:  Number of Hours: 5.5  Cognition: WNL  ADL's:  Fair     COGNITIVE FEATURES THAT CONTRIBUTE TO RISK:  Closed-mindedness and Loss of executive function    SUICIDE RISK:   Moderate:  Frequent suicidal ideation with limited intensity, and duration, some specificity in terms of plans, no associated intent, good self-control, limited dysphoria/symptomatology, some risk factors present, and identifiable protective factors, including available and accessible social support.  PLAN OF CARE: Patient will be admitted to inpatient psychiatric unit for stabilization and safety. Will provide and encourage milieu participation. Provide medication management and maked adjustments as needed.  Will follow daily.    I certify that inpatient services furnished can reasonably be expected to improve the patient's condition.   Nehemiah MassedOBOS, Caia Lofaro, MD 04/06/2016, 2:44 PM

## 2016-04-06 NOTE — [facility] Group Notes (Signed)
The focus of this group is to educate the patient on the purpose and policies of crisis stabilization and provide a format to answer questions about their admission.  The group details unit policies and expectations of patients while admitted.  Patient did not attend 0900 nurse education orientation group this morning.  Patient stayed in bed.   

## 2016-04-06 NOTE — Plan of Care (Signed)
Problem: Consults Goal: Depression Patient Education See Patient Education Module for education specifics.  Outcome: Progressing Nurse discussed depression/coping skills with patient.        

## 2016-04-06 NOTE — [facility] Group Notes (Signed)
Pt did not attend karaoke group.  Giulianna Rocha, MHT  

## 2016-04-07 DIAGNOSIS — F332 Major depressive disorder, recurrent severe without psychotic features: Secondary | ICD-10-CM | POA: Insufficient documentation

## 2016-04-07 MED ORDER — TRAZODONE HCL 100 MG PO TABS: 100.0000 mg | ORAL_TABLET | Freq: Every evening | ORAL | Status: DC | PRN

## 2016-04-07 NOTE — [facility] Group Notes (Signed)
   [facility] Dis[facility] Planning Group Note  04/07/2016  8:45 AM   Participation Quality: Alert, Appropriate and Oriented  Mood/Affect: Depressed and Flat  Depression Rating: 9  Anxiety Rating: 10  Thoughts of Suicide: Pt denies SI/HI  Will you contract for safety? Yes  Current AVH: Pt denies  Plan for Discharge/Comments: Pt attended discharge planning group and actively participated in group. CSW provided pt with today's workbook. Patient plans to return home to follow up with outpatient services.   Transportation Means: CSW continuing to assess  Supports: No supports mentioned at this time  Samuella BruinKristin Megumi Treaster, MSW, Johnson & JohnsonLCSW Clinical Social Worker [facility] [phone]

## 2016-04-07 NOTE — Progress Notes (Signed)
Patient ID: Edwin Vaughan, male   DOB: January 05, 1971, 45 y.o.   MRN: [id number]  Pt currently presents with a flat affect and cooperative behavior. Per self inventory, pt rates depression at a 9, hopelessness 9 and anxiety 10. Pt's daily goal is to "not sure." Pt interacts minimally with staff and other pts. Pt reports fair sleep, a fair appetite, low energy and poor concentration.   Pt provided with medications per providers orders. Pt's labs and vitals were monitored throughout the day. Pt supported emotionally and encouraged to express concerns and questions. Pt educated on medications. Patient placed on high fall risk due to current dizziness and lightheadedness.   Pt's safety ensured with 15 minute and environmental checks. Pt currently denies SI/HI and A/V hallucinations. Pt verbally agrees to seek staff if SI/HI or A/VH occurs and to consult with staff before acting on these thoughts. Will continue POC.

## 2016-04-07 NOTE — [facility] Group Notes (Signed)
[facility] LCSW Group Therapy 04/07/2016  1:15 PM   Type of Therapy: Group Therapy  Participation Level: Did Not Attend. Patient invited to participate but declined.   Amori Colomb, MSW, LCSW Clinical Social Worker [facility] [phone]   

## 2016-04-07 NOTE — Progress Notes (Addendum)
[facility] MD Progress Note  04/07/2016 9:59 AM KEYDEN PAVLOV  MRN:  [id number] Subjective:  Patient reports ongoing anxiety, depression, but does state he is feeling a little better today. He remains somatically focused, and is concerned about having " ministrokes  Or TIAs" very often. As noted he has consulted neurology and been to ERs for the above, but work ups have been negative . Reports symptoms such as intermittent headaches, intermittent bilateral weakness, and a sense of " brain fog".  He denies medication side effects at this time .  Objective : I have discussed case with treatment team and have met with patient. Patient presents anxious, somatically focused, but presenting with improving mood and range of affect today. As above, worries about possible CNS compromise, in spite of having been reassured by a neurologist and negative neuro-imaging tests . He is alert, oriented x 3, and there is no gross cognitive decline, but he does report a sense that his memory and executive functioning are not to the level they have been. Denies suicidal ideations , contracts for safety on unit. No disruptive or agitated behaviors .   Principal Problem: MDD (major depressive disorder), recurrent episode, severe ([place]) Diagnosis:   Patient Active Problem List   Diagnosis Date Noted  . MDD (major depressive disorder), recurrent episode, severe ([place]) [F33.2] 04/05/2016  . 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 Snoring [R06.83] 03/13/2016  . Gasping for breath [R06.89] 03/13/2016  . Fatigue [R53.83] 03/01/2016  . PCP NOTES >>>>>>>>>>>>>>>>>>>>>>>>>>>>>>>. [Z09] 02/06/2016  . Depression [F32.9] 01/18/2016  . Generalized anxiety disorder [F41.1] 01/18/2016  . Left-sided weakness [M62.89] 01/17/2016  . Word finding difficulty [R47.89] 01/17/2016  . Blurred vision [H53.8] 01/17/2016  . Swallowing difficulty [R13.10] 01/17/2016  . Annual physical exam [I95.18] 08/10/2015  . Shellfish  allergy [Z91.013] 08/10/2015  . Hypertension [I10] 08/05/2015  . Scrotal mass [N50.9] 08/19/2013  . 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 Anxiety [F41.9] 06/22/2013  . Insomnia [G47.00] 06/22/2013  . IBS (irritable bowel syndrome) [K58.9] 06/22/2013  . Neck mass [R22.1] 07/01/2012  . Contact dermatitis [L25.9] 03/13/2011   Total Time spent with patient: 20 minutes    Past Medical History:  Past Medical History  Diagnosis Date  . IBS (irritable bowel syndrome)   . Headache(784.0)     "most days recently related to my blood pressure" (06/17/2013)  . Environmental allergies     "pretty much anything that grows, I'm allergic to it" (06/17/2013)  . 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 Hypertension     takes Losartan daily  . Weakness     both hands and left leg .Numbness and tingling  . Arthritis     lower back   . Vitamin D deficiency     takes Vit D weekly  . Nocturia   . Depression     was on Lexapro but stopped a few months ago  . Insomnia     doesn't take any meds  . Complication of anesthesia     FYI: Egg and shellfish allergies    Past Surgical History  Procedure Laterality Date  . Wisdom tooth extraction  2000's  . Colonoscopy    . Radiology with anesthesia N/A 03/02/2016    Procedure: MRI OF BRAIN W/WO    (RADIOLOGY WITH ANESTHESIA);  Surgeon: Medication Radiologist, MD;  Location: [place];  Service: Radiology;  Laterality: N/A;   Family History:  Family History  Problem Relation Age of Onset  . Hypertension Mother   .  Stroke Mother   . Colon cancer Father 50  . Prostate cancer Neg Hx   . Diabetes Neg Hx     Social History:  History  Alcohol Use No    Comment: 06/17/2013 "1-2 drinks/yr"     History  Drug Use No    Social History   Social History  . Marital Status: Single    Spouse Name: N/A  . Number of Children: 0  . Years of Education: 12+   Occupational History  . Unemployed    Social History Main Topics  . Smoking status: Never  Smoker   . Smokeless tobacco: Never Used  . Alcohol Use: No     Comment: 06/17/2013 "1-2 drinks/yr"  . Drug Use: No  . Sexual Activity: No   Other Topics Concern  . None   Social History Narrative   No job at present,  Single,  No children.   Lives at mother's house , lost mother [name] 12-2015      Caffeine use: Coffee.tea- rare   Additional Social History:   Sleep: improved  Appetite:  improved   Current Medications: Current Facility-Administered Medications  Medication Dose Route Frequency Provider Last Rate Last Dose  . acetaminophen (TYLENOL) tablet 650 mg  650 mg Oral Q6H PRN Delfin Gant, NP      . alum & mag hydroxide-simeth (MAALOX/MYLANTA) 200-200-20 MG/5ML suspension 30 mL  30 mL Oral Q4H PRN Delfin Gant, NP      . docusate sodium (COLACE) capsule 100 mg  100 mg Oral Daily Encarnacion Slates, NP   100 mg at 04/07/16 0852  . feeding supplement (ENSURE ENLIVE) (ENSURE ENLIVE) liquid 237 mL  237 mL Oral BID BM Myer Peer Cobos, MD   237 mL at 04/06/16 1500  . FLUoxetine (PROZAC) tablet 20 mg  20 mg Oral Daily Delfin Gant, NP   20 mg at 04/07/16 0849  . LORazepam (ATIVAN) tablet 0.5 mg  0.5 mg Oral Q6H PRN Jenne Campus, MD      . losartan (COZAAR) tablet 50 mg  50 mg Oral Daily Jenne Campus, MD   50 mg at 04/07/16 0849  . magnesium hydroxide (MILK OF MAGNESIA) suspension 30 mL  30 mL Oral Daily PRN Delfin Gant, NP      . promethazine (PHENERGAN) tablet 25 mg  25 mg Oral Q6H PRN Delfin Gant, NP      . traZODone (DESYREL) tablet 100 mg  100 mg Oral QHS Encarnacion Slates, NP   100 mg at 04/06/16 2200    Lab Results: No results found for this or any previous visit (from the past 48 hour(s)).  Blood Alcohol level:  Lab Results  Component Value Date   [facility] <5 04/04/2016   ETH <11 06/21/2013    Physical Findings: AIMS: Facial and Oral Movements Muscles of Facial Expression: None, normal Lips and Perioral Area: None, normal Jaw: None,  normal Tongue: None, normal,Extremity Movements Upper (arms, wrists, hands, fingers): None, normal Lower (legs, knees, ankles, toes): None, normal, Trunk Movements Neck, shoulders, hips: None, normal, Overall Severity Severity of abnormal movements (highest score from questions above): None, normal Incapacitation due to abnormal movements: None, normal Patient's awareness of abnormal movements (rate only patient's report): No Awareness, Dental Status Current problems with teeth and/or dentures?: No Does patient usually wear dentures?: No  CIWA:  CIWA-Ar Total: 3 COWS:  COWS Total Score: 3  Musculoskeletal: Strength & Muscle Tone: within normal limits Gait &  Station: normal Patient leans: N/A  Psychiatric Specialty Exam: ROS  Frequent headaches, subjective feeling of " brain fog ".  No chest pain, no shortness of breath  Blood pressure 116/85, pulse 106, temperature 98 F (36.7 C), temperature source Oral, resp. rate 18, height 5' 5"  (1.651 m), weight 195 lb (88.451 kg).Body mass index is 32.45 kg/(m^2).  General Appearance: Fairly Groomed  Engineer, water::  Good  Speech:  Normal Rate  Volume:  Normal  Mood:  Anxious and Depressed, but improving compared to admission  Affect:  Congruent  Thought Process:  Linear  Orientation:  Full (Time, Place, and Person)  Thought Content:  ruminative about his physical health/symptoms, no hallucinations   Suicidal Thoughts:  No at this time denies any suicidal ideations, denies any self injurious ideations   Homicidal Thoughts:  No  Memory:  recent and remote grossly intact   Judgement:  Fair  Insight:  Fair  Psychomotor Activity:  Normal   Concentration:  Good  Recall:  Good  Fund of Knowledge:Good  Language: Good  Akathisia:  Negative  Handed:  Right  AIMS (if indicated):     Assets:  Communication Skills Desire for Improvement Resilience  ADL's:  Intact  Cognition: WNL  Sleep:  Number of Hours: 6.5  Assessment -  Patient presents  with partial improvement today, less depressed. Denies any SI at this time. Remains anxious and somatically focused, describing symptoms of perceived mental slowing , " brain fog", decreased mental acuity. He is fully alert, attentive, oriented x 3, and no gross cognitive or neurologic deficit is noted. BP has normalized, on Losartan, denies side effects At this time tolerating medications well .  Treatment Plan Summary: Daily contact with patient to assess and evaluate symptoms and progress in treatment, Medication management, Plan inpatient treatment  and medications as below  Encourage ongoing group and milieu participation to work on coping skills and symptom reduction Continue Prozac 20 mgrs QDAY for depression and anxiety- has been taking this medication for 2 weeks, no side effects thus far Continue Ativan 0.5 mgrs Q 6 hours PRN for anxiety as needed  Continue Trazodone 100 mgrs QHS PRN for insomnia as needed  Continue Losartan for HTN As patient reports remote history of Vitamin D deficiency, will order B12, Folate, Vit D levels and also will order TSH . Neita Garnet, MD 04/07/2016, 9:59 AM

## 2016-04-08 DIAGNOSIS — F332 Major depressive disorder, recurrent severe without psychotic features: Principal | ICD-10-CM

## 2016-04-08 LAB — VITAMIN B12: Vitamin B-12: 400 pg/mL (ref 180–914)

## 2016-04-08 LAB — FOLATE: Folate: 9.1 ng/mL (ref >5.9)

## 2016-04-08 LAB — TSH: TSH: 1.353 u[IU]/mL (ref 0.350–4.500)

## 2016-04-08 MED ORDER — BUSPIRONE HCL 10 MG PO TABS
10.0000 mg | ORAL_TABLET | Freq: Two times a day (BID) | ORAL | Status: DC
  Administered 2016-04-08 – 2016-04-13 (×10): 10 mg via ORAL
  Filled 2016-04-08: qty 1
  Filled 2016-04-08: qty 2
  Filled 2016-04-08 (×13): qty 1

## 2016-04-08 NOTE — Progress Notes (Signed)
D: Pt is isolative and withdrawn to room; stayed in bed all evening. Pt endorses moderate depression and moderate anxiety. Pt also complained of R. side neck pain of 4. Pt could also be paranoid and delusional; states, "I think I have several strokes recently; my eyes are red (Pt's eyes were not red), I think my B/P is high. Pt however denied SI, HI or AVH. Pt at the time of assessment does not look to be in any distress.  A: Medications offered as prescribed.  Pt's B/P was checked. Support, encouragement, and safe environment provided.  15-minute safety checks continue. R: Pt was med compliant.  B/P was 128/94. Pt did not attend wrap-up group. Safety checks continue.

## 2016-04-08 NOTE — Progress Notes (Signed)
Patient ID: Edwin Vaughan, male   DOB: 09-22-1971, 45 y.o.   MRN: [id number]   D: Pt has been very flat and depressed on the unit today. Pt was also very isolative, he was in the bed most of the day. Pt reported to this writer that he was just really tired. Pt took all mediations as prescribed by the doctor. Pt did not attend any groups nor did he engage in treatment. Pt reported that his depression was a 9, his hopelessness was a 10, and his anxiety was a 10. Pt reported that his goal for today was to sleep. Pt reported being negative SI/HI, no AH/VH noted. A: 15 min checks continued for patient safety. R: Pt safety maintained.

## 2016-04-08 NOTE — Progress Notes (Signed)
[facility] MD Progress Note  04/08/2016 1:34 PM Edwin Vaughan  MRN:  [id number] Subjective:  Patient reports ongoing anxiety, depression, but does state he is feeling a little better today.  As the note from yesterday states, patient remains somatically focused, and is concerned that he is having mini strokes even thought CT scans and MRI's have had negative results.  He reports that his mind is not as sharp and has lost some fine motor skills.    He denies medication side effects at this time.    Objective : I have discussed case with treatment team and have met with patient. Patient presents anxious, somatically focused, but presenting with improving mood and range of affect today. As above, worries about possible CNS compromise, in spite of having been reassured by a neurologist and negative neuro-imaging tests . He is alert, oriented x 3, and there is no gross cognitive decline, but he does report a sense that his memory and executive functioning are not to the level they have been. Denies suicidal ideations , contracts for safety on unit. No disruptive or agitated behaviors.  Principal Problem: MDD (major depressive disorder), recurrent episode, severe ([place]) Diagnosis:   Patient Active Problem List   Diagnosis Date Noted  . Severe episode of recurrent major depressive disorder, without psychotic features ([place]) [F33.2]   . MDD (major depressive disorder), recurrent episode, severe ([place]) [F33.2] 04/05/2016  . 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 Snoring [R06.83] 03/13/2016  . Gasping for breath [R06.89] 03/13/2016  . Fatigue [R53.83] 03/01/2016  . PCP NOTES >>>>>>>>>>>>>>>>>>>>>>>>>>>>>>>. [Z09] 02/06/2016  . Depression [F32.9] 01/18/2016  . Generalized anxiety disorder [F41.1] 01/18/2016  . Left-sided weakness [M62.89] 01/17/2016  . Word finding difficulty [R47.89] 01/17/2016  . Blurred vision [H53.8] 01/17/2016  . Swallowing difficulty [R13.10] 01/17/2016  .  Annual physical exam [O53.66] 08/10/2015  . Shellfish allergy [Z91.013] 08/10/2015  . Hypertension [I10] 08/05/2015  . Scrotal mass [N50.9] 08/19/2013  . 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 Anxiety [F41.9] 06/22/2013  . Insomnia [G47.00] 06/22/2013  . IBS (irritable bowel syndrome) [K58.9] 06/22/2013  . Neck mass [R22.1] 07/01/2012  . Contact dermatitis [L25.9] 03/13/2011   Total Time spent with patient: 20 minutes    Past Medical History:  Past Medical History  Diagnosis Date  . IBS (irritable bowel syndrome)   . Headache(784.0)     "most days recently related to my blood pressure" (06/17/2013)  . Environmental allergies     "pretty much anything that grows, I'm allergic to it" (06/17/2013)  . 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 Hypertension     takes Losartan daily  . Weakness     both hands and left leg .Numbness and tingling  . Arthritis     lower back   . Vitamin D deficiency     takes Vit D weekly  . Nocturia   . Depression     was on Lexapro but stopped a few months ago  . Insomnia     doesn't take any meds  . Complication of anesthesia     FYI: Egg and shellfish allergies    Past Surgical History  Procedure Laterality Date  . Wisdom tooth extraction  2000's  . Colonoscopy    . Radiology with anesthesia N/A 03/02/2016    Procedure: MRI OF BRAIN W/WO    (RADIOLOGY WITH ANESTHESIA);  Surgeon: Medication Radiologist, MD;  Location: [place];  Service: Radiology;  Laterality: N/A;   Family History:  Family History  Problem Relation Age of Onset  . Hypertension Mother   . Stroke Mother   . Colon cancer Father 54  . Prostate cancer Neg Hx   . Diabetes Neg Hx     Social History:  History  Alcohol Use No    Comment: 06/17/2013 "1-2 drinks/yr"     History  Drug Use No    Social History   Social History  . Marital Status: Single    Spouse Name: N/A  . Number of Children: 0  . Years of Education: 12+   Occupational History  . Unemployed     Social History Main Topics  . Smoking status: Never Smoker   . Smokeless tobacco: Never Used  . Alcohol Use: No     Comment: 06/17/2013 "1-2 drinks/yr"  . Drug Use: No  . Sexual Activity: No   Other Topics Concern  . None   Social History Narrative   No job at present,  Single,  No children.   Lives at mother's house , lost mother [name] 12-2015      Caffeine use: Coffee.tea- rare   Additional Social History:   Sleep: improved  Appetite:  improved   Current Medications: Current Facility-Administered Medications  Medication Dose Route Frequency Provider Last Rate Last Dose  . acetaminophen (TYLENOL) tablet 650 mg  650 mg Oral Q6H PRN Delfin Gant, NP      . alum & mag hydroxide-simeth (MAALOX/MYLANTA) 200-200-20 MG/5ML suspension 30 mL  30 mL Oral Q4H PRN Delfin Gant, NP      . docusate sodium (COLACE) capsule 100 mg  100 mg Oral Daily Encarnacion Slates, NP   100 mg at 04/08/16 0835  . feeding supplement (ENSURE ENLIVE) (ENSURE ENLIVE) liquid 237 mL  237 mL Oral BID BM Myer Peer Cobos, MD   237 mL at 04/08/16 0834  . FLUoxetine (PROZAC) tablet 20 mg  20 mg Oral Daily Delfin Gant, NP   20 mg at 04/08/16 0835  . LORazepam (ATIVAN) tablet 0.5 mg  0.5 mg Oral Q6H PRN Jenne Campus, MD   0.5 mg at 04/08/16 0155  . losartan (COZAAR) tablet 50 mg  50 mg Oral Daily Jenne Campus, MD   50 mg at 04/08/16 0835  . magnesium hydroxide (MILK OF MAGNESIA) suspension 30 mL  30 mL Oral Daily PRN Delfin Gant, NP      . promethazine (PHENERGAN) tablet 25 mg  25 mg Oral Q6H PRN Delfin Gant, NP      . traZODone (DESYREL) tablet 100 mg  100 mg Oral QHS PRN Jenne Campus, MD        Lab Results:  Results for orders placed or performed during the hospital encounter of 04/05/16 (from the past 48 hour(s))  TSH     Status: None   Collection Time: 04/08/16  6:33 AM  Result Value Ref Range   TSH 1.353 0.350 - 4.500 uIU/mL    Comment: Performed at [facility]  Vitamin B12     Status: None   Collection Time: 04/08/16  6:33 AM  Result Value Ref Range   Vitamin B-12 400 180 - 914 pg/mL    Comment: (NOTE) This assay is not validated for testing neonatal or myeloproliferative syndrome specimens for Vitamin B12 levels. Performed at [facility]   Folate     Status: None   Collection Time: 04/08/16  6:33 AM  Result Value Ref Range   Folate 9.1 >5.9  ng/mL    Comment: Performed at [facility]    Blood Alcohol level:  Lab Results  Component Value Date   [facility] <5 04/04/2016   ETH <11 06/21/2013    Physical Findings: AIMS: Facial and Oral Movements Muscles of Facial Expression: None, normal Lips and Perioral Area: None, normal Jaw: None, normal Tongue: None, normal,Extremity Movements Upper (arms, wrists, hands, fingers): None, normal Lower (legs, knees, ankles, toes): None, normal, Trunk Movements Neck, shoulders, hips: None, normal, Overall Severity Severity of abnormal movements (highest score from questions above): None, normal Incapacitation due to abnormal movements: None, normal Patient's awareness of abnormal movements (rate only patient's report): No Awareness, Dental Status Current problems with teeth and/or dentures?: No Does patient usually wear dentures?: No  CIWA:  CIWA-Ar Total: 3 COWS:  COWS Total Score: 3  Musculoskeletal: Strength & Muscle Tone: within normal limits Gait & Station: normal Patient leans: N/A  Psychiatric Specialty Exam: ROS  Frequent headaches, subjective feeling of " brain fog ".  No chest pain, no shortness of breath  Blood pressure 122/85, pulse 121, temperature 98.5 F (36.9 C), temperature source Oral, resp. rate 15, height _0  (1.651 m), weight 88.451 kg (195 lb).Body mass index is 32.45 kg/(m^2).  General Appearance: Fairly Groomed  Engineer, water::  Good  Speech:  Normal Rate  Volume:  Normal  Mood:  Anxious and Depressed, but improving compared to admission   Affect:  Congruent  Thought Process:  Linear  Orientation:  Full (Time, Place, and Person)  Thought Content:  ruminative about his physical health/symptoms, no hallucinations   Suicidal Thoughts:  No at this time denies any suicidal ideations, denies any self injurious ideations   Homicidal Thoughts:  No  Memory:  recent and remote grossly intact   Judgement:  Fair  Insight:  Fair  Psychomotor Activity:  Normal   Concentration:  Good  Recall:  Good  Fund of Knowledge:Good  Language: Good  Akathisia:  Negative  Handed:  Right  AIMS (if indicated):     Assets:  Communication Skills Desire for Improvement Resilience  ADL's:  Intact  Cognition: WNL  Sleep:  Number of Hours: 5.25   Assessment -  Patient presents with partial improvement today, less depressed. Denies any SI at this time. Remains anxious and somatically focused, describing symptoms of perceived mental slowing , " brain fog", decreased mental acuity. He is fully alert, attentive, oriented x 3, and no gross cognitive or neurologic deficit is noted. BP has normalized, on Losartan, denies side effects At this time tolerating medications well.  Treatment Plan Summary: Daily contact with patient to assess and evaluate symptoms and progress in treatment, Medication management, Plan inpatient treatment  and medications as below  Encourage ongoing group and milieu participation to work on coping skills and symptom reduction Continue Prozac 20 mgrs QDAY for depression and anxiety- has been taking this medication for 2 weeks, no side effects thus far Continue Ativan 0.5 mgrs Q 6 hours PRN for anxiety as needed  Continue Trazodone 100 mgrs QHS PRN for insomnia as needed  Continue Losartan for HTN Buspar 10 mg BID for anxiety d/o As patient reports remote history of Vitamin D deficiency, will order B12, Folate, Vit D levels and also will order TSH.  Janett Labella, NP [facility] 04/08/2016, 1:34 PM

## 2016-04-08 NOTE — [facility] Group Notes (Signed)
[facility] LCSW Group Therapy  04/08/2016 / 10:15 - 11:15 AM  Type of Therapy:  Group Therapy  Participation Level:  Did Not Attend although he said he would be coming when CSW approached patient's in their rooms to tell them group was two start at 10:15   Harrill, Edwin Vaughan

## 2016-04-08 NOTE — Plan of Care (Signed)
Problem: Diagnosis: Increased Risk For Suicide Attempt Goal: STG-Patient Will Attend All Groups On The Unit Outcome: Not Progressing Patient remained in bed during evening group.

## 2016-04-09 ENCOUNTER — Encounter (HOSPITAL_COMMUNITY): Payer: Self-pay | Admitting: Registered Nurse

## 2016-04-09 DIAGNOSIS — R45851 Suicidal ideations: Secondary | ICD-10-CM

## 2016-04-09 NOTE — Progress Notes (Signed)
Patient isolative to room, bed. Withdrawn. Flat affect, depressed mood with brief eye contact. Patient states, "I'm agitated." When asked why he replies, "it's just the stage my life is in right now." Patient med compliant however is not attending programming. Denies pain, physical issues. Medicated per orders. No prn's requested or required. Encouraged to complete self inventory as well as to participate in unit activities and groups. Emotional support offered. Patient minimally receptive. He denies SI/HI and remains safe at this time, on level III obs.

## 2016-04-09 NOTE — Plan of Care (Signed)
Problem: Alteration in mood Goal: STG-Patient reports thoughts of self-harm to staff Outcome: Progressing Patient denies  SI, thoughts to self harm.  Problem: Diagnosis: Increased Risk For Suicide Attempt Goal: STG-Patient Will Comply With Medication Regime Outcome: Progressing Patient is med compliant.     

## 2016-04-09 NOTE — [facility] Group Notes (Signed)
[facility] LCSW Group Therapy  04/09/2016 10:00 AM  Type of Therapy:  Group Therapy  Participation Level:  Did Not Attend   Beverly SessionsLINDSEY, Edwin Vaughan 04/09/2016, 3:33 PM

## 2016-04-09 NOTE — Progress Notes (Signed)
Patient has been in bed, room all day. Spoke with patient 1:1 and suggested he set a goal of 30 minutes outside of his room. Patient states he is "swimmy headed" and states watching tv, interacting with others is too difficult with his "brain fatigue." Patient went on to describe the progression of "pains and pops" in his head since last August. "I just wish the scans would show something. Then maybe I could get disability. No one believes me." Self inventory completed with encouragement - rates depression, hopelessness and anxiety all at a 10/10. Reports he doesn't know what his goal is. Emotional support given. Fall precautions reviewed and in place. Verbalizes understanding. Patient receptive however returned to his room. He remains safe on level III obs.
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
## 2016-04-09 NOTE — Progress Notes (Signed)
Adult Psychoeducational Group Note  Date:  04/09/2016 Time:  9:16 PM  Group Topic/Focus:  Wrap-Up Group:   The focus of this group is to help patients review their daily goal of treatment and discuss progress on daily workbooks.  Participation Level:  Did Not Attend  Participation Quality:  None  Affect:  None  Cognitive:  None  Insight: None  Engagement in Group:  None  Modes of Intervention:  Discussion, Education and Support  Additional Comments:  Pt did not attend this group session.  Malachy MoanJeffers, Jeffree Cazeau S 04/09/2016, 9:16 PM

## 2016-04-09 NOTE — Progress Notes (Signed)
Patient has been isolative to his room for the 2nd night. He has been in bed either lying down or sitting up off and on. Writer spoke with him 1:1 and asked if he would try and come to the dayroom or walk the hallway for a short while and he declined the suggestion. He did not attend group and declined medication to aid with sleep if needed. Writer asked about his appetite and he reports that he has been eating fruit most of the day and a salad at lunch. He reports that the food is too salty for him here. He denies suicidal ideations but does seem preoccupied wit his thoughts. Support given and safety maintained on unit with 15 min checks.

## 2016-04-10 MED ORDER — ARIPIPRAZOLE 5 MG PO TABS
5.0000 mg | ORAL_TABLET | Freq: Every day | ORAL | Status: DC
  Administered 2016-04-10 – 2016-04-12 (×3): 5 mg via ORAL
  Filled 2016-04-10 (×7): qty 1

## 2016-04-10 NOTE — [facility] Group Notes (Signed)
[facility] LCSW Group Therapy 04/10/2016  1:15 PM   Type of Therapy: Group Therapy  Participation Level: Did Not Attend. Patient invited to participate but declined.   Samuella BruinKristin Erlean Mealor, MSW, LCSW Clinical Social Worker [facility] [phone]

## 2016-04-10 NOTE — Progress Notes (Signed)
Patient ID: Edwin Vaughan, male   DOB: [birth year], 45 y.o.   MRN: [id number] [facility] MD Progress Note  04/10/2016 4:07 PM Edwin Vaughan  MRN:  [id number] Subjective:  Patient states he has continued to have " ministrokes ", which cause him to feel mentally foggy and " mentally exhausted ".  He denies medication side effects.  Objective : I have discussed case with treatment team and have met with patient. Patient continues to present somatically focused, preoccupied that he has been having ongoing CVAs resulting in vague symptoms of mental exhaustion and " brain fog". Of note no amaurosis, no lateralization, no aphasia, gait stable . As discussed with staff, he also has been ruminative about food , salt content in food, and how this may affect him. He denies medication side effects at this time. Group participation/milieu participation has been limited. No disruptive or agitated behaviors on unit, tends to isolate .   Principal Problem: MDD (major depressive disorder), recurrent episode, severe ([place]) Diagnosis:   Patient Active Problem List   Diagnosis Date Noted  . Severe episode of recurrent major depressive disorder, without psychotic features ([place]) [F33.2]   . MDD (major depressive disorder), recurrent episode, severe ([place]) [F33.2] 04/05/2016  . 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 Snoring [R06.83] 03/13/2016  . Gasping for breath [R06.89] 03/13/2016  . Fatigue [R53.83] 03/01/2016  . PCP NOTES >>>>>>>>>>>>>>>>>>>>>>>>>>>>>>>. [Z09] 02/06/2016  . Depression [F32.9] 01/18/2016  . Generalized anxiety disorder [F41.1] 01/18/2016  . Left-sided weakness [M62.89] 01/17/2016  . Word finding difficulty [R47.89] 01/17/2016  . Blurred vision [H53.8] 01/17/2016  . Swallowing difficulty [R13.10] 01/17/2016  . Annual physical exam [O96.29] 08/10/2015  . Shellfish allergy [Z91.013] 08/10/2015  . Hypertension [I10] 08/05/2015  . Scrotal mass [N50.9] 08/19/2013  .  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 Anxiety [F41.9] 06/22/2013  . Insomnia [G47.00] 06/22/2013  . IBS (irritable bowel syndrome) [K58.9] 06/22/2013  . Neck mass [R22.1] 07/01/2012  . Contact dermatitis [L25.9] 03/13/2011   Total Time spent with patient:  25 minutes     Past Medical History:  Past Medical History  Diagnosis Date  . IBS (irritable bowel syndrome)   . Headache(784.0)     "most days recently related to my blood pressure" (06/17/2013)  . Environmental allergies     "pretty much anything that grows, I'm allergic to it" (06/17/2013)  . 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 Hypertension     takes Losartan daily  . Weakness     both hands and left leg .Numbness and tingling  . Arthritis     lower back   . Vitamin D deficiency     takes Vit D weekly  . Nocturia   . Depression     was on Lexapro but stopped a few months ago  . Insomnia     doesn't take any meds  . Complication of anesthesia     FYI: Egg and shellfish allergies    Past Surgical History  Procedure Laterality Date  . Wisdom tooth extraction  2000's  . Colonoscopy    . Radiology with anesthesia N/A 03/02/2016    Procedure: MRI OF BRAIN W/WO    (RADIOLOGY WITH ANESTHESIA);  Surgeon: Medication Radiologist, MD;  Location: [place];  Service: Radiology;  Laterality: N/A;   Family History:  Family History  Problem Relation Age of Onset  . Hypertension Mother   . Stroke Mother   . Colon cancer Father 46  . Prostate cancer Neg Hx   .  Diabetes Neg Hx     Social History:  History  Alcohol Use No    Comment: 06/17/2013 "1-2 drinks/yr"     History  Drug Use No    Social History   Social History  . Marital Status: Single    Spouse Name: N/A  . Number of Children: 0  . Years of Education: 12+   Occupational History  . Unemployed    Social History Main Topics  . Smoking status: Never Smoker   . Smokeless tobacco: Never Used  . Alcohol Use: No     Comment: 06/17/2013 "1-2 drinks/yr"  .  Drug Use: No  . Sexual Activity: No   Other Topics Concern  . None   Social History Narrative   No job at present,  Single,  No children.   Lives at mother's house , lost mother [name] 12-2015      Caffeine use: Coffee.tea- rare   Additional Social History:   Sleep: improved  Appetite:  improved   Current Medications: Current Facility-Administered Medications  Medication Dose Route Frequency Provider Last Rate Last Dose  . acetaminophen (TYLENOL) tablet 650 mg  650 mg Oral Q6H PRN Delfin Gant, NP      . alum & mag hydroxide-simeth (MAALOX/MYLANTA) 200-200-20 MG/5ML suspension 30 mL  30 mL Oral Q4H PRN Delfin Gant, NP      . ARIPiprazole (ABILIFY) tablet 5 mg  5 mg Oral Daily Myer Peer Leaman Abe, MD      . busPIRone (BUSPAR) tablet 10 mg  10 mg Oral BID Kerrie Buffalo, NP   10 mg at 04/10/16 0815  . docusate sodium (COLACE) capsule 100 mg  100 mg Oral Daily Encarnacion Slates, NP   100 mg at 04/10/16 0815  . feeding supplement (ENSURE ENLIVE) (ENSURE ENLIVE) liquid 237 mL  237 mL Oral BID BM Myer Peer Almarosa Bohac, MD   237 mL at 04/08/16 0834  . FLUoxetine (PROZAC) tablet 20 mg  20 mg Oral Daily Delfin Gant, NP   20 mg at 04/10/16 0814  . LORazepam (ATIVAN) tablet 0.5 mg  0.5 mg Oral Q6H PRN Jenne Campus, MD   0.5 mg at 04/09/16 5732  . losartan (COZAAR) tablet 50 mg  50 mg Oral Daily Jenne Campus, MD   50 mg at 04/10/16 0815  . magnesium hydroxide (MILK OF MAGNESIA) suspension 30 mL  30 mL Oral Daily PRN Delfin Gant, NP      . promethazine (PHENERGAN) tablet 25 mg  25 mg Oral Q6H PRN Delfin Gant, NP      . traZODone (DESYREL) tablet 100 mg  100 mg Oral QHS PRN Jenne Campus, MD        Lab Results: No results found for this or any previous visit (from the past 48 hour(s)).  Blood Alcohol level:  Lab Results  Component Value Date   [facility] <5 04/04/2016   ETH <11 06/21/2013    Physical Findings: AIMS: Facial and Oral Movements Muscles of Facial  Expression: None, normal Lips and Perioral Area: None, normal Jaw: None, normal Tongue: None, normal,Extremity Movements Upper (arms, wrists, hands, fingers): None, normal Lower (legs, knees, ankles, toes): None, normal, Trunk Movements Neck, shoulders, hips: None, normal, Overall Severity Severity of abnormal movements (highest score from questions above): None, normal Incapacitation due to abnormal movements: None, normal Patient's awareness of abnormal movements (rate only patient's report): No Awareness, Dental Status Current problems with teeth and/or dentures?: No Does patient usually wear  dentures?: No  CIWA:  CIWA-Ar Total: 3 COWS:  COWS Total Score: 3  Musculoskeletal: Strength & Muscle Tone: within normal limits Gait & Station: normal Patient leans: N/A  Psychiatric Specialty Exam: ROS  Frequent headaches, subjective feeling of " brain fog ".  No chest pain, no shortness of breath  Blood pressure 122/90, pulse 102, temperature 98.2 F (36.8 C), temperature source Oral, resp. rate 18, height _0  (1.651 m), weight 195 lb (88.451 kg).Body mass index is 32.45 kg/(m^2).  General Appearance: Fairly Groomed  Engineer, water::  Good  Speech:  Normal Rate  Volume:  Normal  Mood:  Remains depressed , although affect has improved partially  Affect:  Congruent, smiles at times appropriately   Thought Process:  Linear  Orientation:  Full (Time, Place, and Person)  Thought Content:  ruminative about his physical health/symptoms, no hallucinations - somatic ruminations as above   Suicidal Thoughts:  No at this time denies any suicidal ideations, denies any self injurious ideations   Homicidal Thoughts:  No  Memory:  recent and remote grossly intact   Judgement:  Fair  Insight:  Fair  Psychomotor Activity:  Normal   Concentration:  Good  Recall:  Good  Fund of Knowledge:Good  Language: Good  Akathisia:  Negative  Handed:  Right  AIMS (if indicated):     Assets:  Communication  Skills Desire for Improvement Resilience  ADL's:  Intact  Cognition: WNL  Sleep:  Number of Hours: 5.5  Assessment - patient continues to present with somatic ruminations, which are significant and on which he continues to focus. Mood remains depressed, although affect is somewhat more reactive. No SI at this time, contracts for safety on unit.  Tolerating medications well at this time . Treatment Plan Summary: Daily contact with patient to assess and evaluate symptoms and progress in treatment, Medication management, Plan inpatient treatment  and medications as below  Encourage ongoing group and milieu participation to work on coping skills and symptom reduction Continue Prozac 20 mgrs QDAY for depression and anxiety Start Abilify 5 mgrs QDAY as antidepressant augmentation and to address somatic ideations, which are approaching delusional intensity - rationale and side effect profile discussed with patient and he agrees to trial. Continue Ativan 0.5 mgrs Q 6 hours PRN for anxiety as needed  Continue Trazodone 100 mgrs QHS PRN for insomnia as needed  Continue Losartan for HTN  Neita Garnet, MD 04/10/2016, 4:07 PM

## 2016-04-10 NOTE — Plan of Care (Signed)
Problem: Diagnosis: Increased Risk For Suicide Attempt Goal: STG-Patient Will Report Suicidal Feelings to Staff Outcome: Progressing Patient currently denies suicidal ideations and will inform staff if suicidal thoughts occur.

## 2016-04-10 NOTE — Progress Notes (Signed)
Adult Psychoeducational Group Note  Date:  04/10/2016 Time:  10:07 PM  Group Topic/Focus:  Wrap-Up Group:   The focus of this group is to help patients review their daily goal of treatment and discuss progress on daily workbooks.  Participation Level:  Active  Participation Quality:  Appropriate  Affect:  Appropriate  Cognitive:  Alert  Insight: Appropriate  Engagement in Group:  Engaged  Modes of Intervention:  Discussion  Additional Comments:  Patient states, "My day was about a 5".  Daeveon Zweber L Lenaya Pietsch 04/10/2016, 10:07 PM

## 2016-04-10 NOTE — Progress Notes (Signed)
Patient has been present in milieu for the latter part of the afternoon. He has been observed interacting appropriately with at least one other peer. Patient verbalizes feelings of excessive fatigue. Will continue to monitor for needs/safety.

## 2016-04-10 NOTE — Progress Notes (Addendum)
D: Pt, who likes to be called Edwin Vaughan, denied SI/HI/AVH this a.m. He has been isolative and complained of neck pain that he attributes to a series of ministrokes, some of which he says may have occurred during this admission. He worried that he has not had enough to eat -- he said he was only able to eat fruit in the a.m because the cafeteria breakfast has too much sodium. He declined prescribed Ensure because "it has too much sodium." On his self inventory, he rated his depression 10/10, anxiety 10/10, and hopelessness 10/10. He reported fair sleep, poor appetite, low energy, and poor concentration.  A: Meds given as ordered. Spoke with Dr. Jama Flavorsobos about above hx and to obtain dietician consult. Q15 safety checks maintained. Support/encouragement offered. R: Pt remains free from harm and continues with treatment. Will continue to monitor for needs/safety.

## 2016-04-11 LAB — VITAMIN D 25 HYDROXY (VIT D DEFICIENCY, FRACTURES): VIT D 25 HYDROXY: 29.7 ng/mL — AB (ref 30.0–100.0)

## 2016-04-11 MED ORDER — VITAMIN D3 25 MCG (1000 UNIT) PO TABS
1000.0000 [IU] | ORAL_TABLET | Freq: Every day | ORAL | Status: DC
  Administered 2016-04-11 – 2016-04-16 (×6): 1000 [IU] via ORAL
  Filled 2016-04-11 (×10): qty 1

## 2016-04-11 NOTE — Progress Notes (Signed)
D: Pt has depressed affect and mood.  Pt reports his day was "okay" and that he had a good visit with his sister tonight.  His goal is to "get better sleep."  Pt denies SI/HI, denies hallucinations, reports chronic neck pain of 7/10.  Pt has been visible in milieu interacting with peers minimally.  Pt attended evening group.   A: Introduced self to pt.  Met with pt 1:1 and offered support and encouragement.  Actively listened to pt.  PRN medication offered for pain, pt refused.  Heat pack offered for pain, pt declined. R: Pt verbally contracts for safety.  He reports he will inform staff of needs and concerns.  Will continue to monitor and assess.

## 2016-04-11 NOTE — [facility] Group Notes (Signed)
[facility] LCSW Group Therapy 04/11/2016  1:15 PM   Type of Therapy: Group Therapy  Participation Level: Did Not Attend. Patient invited to participate but declined.   Melicia Esqueda, MSW, LCSW Clinical Social Worker [facility] [phone]   

## 2016-04-11 NOTE — Progress Notes (Signed)
Pt did not attend wrap-up group   

## 2016-04-11 NOTE — Progress Notes (Signed)
Patient ID: Edwin Vaughan, male   DOB: [birth year], 45 y.o.   MRN: [id number] [facility] MD Progress Note  04/11/2016 5:04 PM Edwin Vaughan  MRN:  [id number] Subjective:  Patient reports modest improvement , states he feels he has had a little more energy today, but is still concerned that he is having " mini strokes" and continues to ruminate about his physical  health.  Although denies medication side effects, worries whether psychiatric medications may compound perceived neurologic deficits,mainly " mental exhaustion" which he attributes to belief he has had mini strokes .  Objective : I have discussed case with treatment team and have met with patient. Somewhat more visible in milieu, sitting in day room, although limited interaction with peers. Grooming is improved . No disruptive or agitated behaviors on unit . Denies medication side effects, and has been compliant, but as above, is concerned about potential CNS side effects. No lateralization, no speech abnormality, no visual deficits, gait steady, and as noted, has had recent, repeat head MRI which have been read as WNL. Denies any current suicidal ideations, but states he is unsure if he would feel safe at this time if he were at home- identifies loneliness and limited support network as major stressors . He denies medication side effects at this time.    Principal Problem: MDD (major depressive disorder), recurrent episode, severe ([place]) Diagnosis:   Patient Active Problem List   Diagnosis Date Noted  . Severe episode of recurrent major depressive disorder, without psychotic features ([place]) [F33.2]   . MDD (major depressive disorder), recurrent episode, severe ([place]) [F33.2] 04/05/2016  . 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 Snoring [R06.83] 03/13/2016  . Gasping for breath [R06.89] 03/13/2016  . Fatigue [R53.83] 03/01/2016  . PCP NOTES >>>>>>>>>>>>>>>>>>>>>>>>>>>>>>>. [Z09] 02/06/2016  . Depression [F32.9]  01/18/2016  . Generalized anxiety disorder [F41.1] 01/18/2016  . Left-sided weakness [M62.89] 01/17/2016  . Word finding difficulty [R47.89] 01/17/2016  . Blurred vision [H53.8] 01/17/2016  . Swallowing difficulty [R13.10] 01/17/2016  . Annual physical exam [U63.33] 08/10/2015  . Shellfish allergy [Z91.013] 08/10/2015  . Hypertension [I10] 08/05/2015  . Scrotal mass [N50.9] 08/19/2013  . 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 Anxiety [F41.9] 06/22/2013  . Insomnia [G47.00] 06/22/2013  . IBS (irritable bowel syndrome) [K58.9] 06/22/2013  . Neck mass [R22.1] 07/01/2012  . Contact dermatitis [L25.9] 03/13/2011   Total Time spent with patient:  25 minutes     Past Medical History:  Past Medical History  Diagnosis Date  . IBS (irritable bowel syndrome)   . Headache(784.0)     "most days recently related to my blood pressure" (06/17/2013)  . Environmental allergies     "pretty much anything that grows, I'm allergic to it" (06/17/2013)  . 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 Hypertension     takes Losartan daily  . Weakness     both hands and left leg .Numbness and tingling  . Arthritis     lower back   . Vitamin D deficiency     takes Vit D weekly  . Nocturia   . Depression     was on Lexapro but stopped a few months ago  . Insomnia     doesn't take any meds  . Complication of anesthesia     FYI: Egg and shellfish allergies    Past Surgical History  Procedure Laterality Date  . Wisdom tooth extraction  2000's  . Colonoscopy    . Radiology with anesthesia N/A 03/02/2016  Procedure: MRI OF BRAIN W/WO    (RADIOLOGY WITH ANESTHESIA);  Surgeon: Medication Radiologist, MD;  Location: [place];  Service: Radiology;  Laterality: N/A;   Family History:  Family History  Problem Relation Age of Onset  . Hypertension Mother   . Stroke Mother   . Colon cancer Father 55  . Prostate cancer Neg Hx   . Diabetes Neg Hx     Social History:  History  Alcohol Use No     Comment: 06/17/2013 "1-2 drinks/yr"     History  Drug Use No    Social History   Social History  . Marital Status: Single    Spouse Name: N/A  . Number of Children: 0  . Years of Education: 12+   Occupational History  . Unemployed    Social History Main Topics  . Smoking status: Never Smoker   . Smokeless tobacco: Never Used  . Alcohol Use: No     Comment: 06/17/2013 "1-2 drinks/yr"  . Drug Use: No  . Sexual Activity: No   Other Topics Concern  . None   Social History Narrative   No job at present,  Single,  No children.   Lives at mother's house , lost mother [name] 12-2015      Caffeine use: Coffee.tea- rare   Additional Social History:   Sleep: improved  Appetite:  improved   Current Medications: Current Facility-Administered Medications  Medication Dose Route Frequency Provider Last Rate Last Dose  . acetaminophen (TYLENOL) tablet 650 mg  650 mg Oral Q6H PRN Delfin Gant, NP      . alum & mag hydroxide-simeth (MAALOX/MYLANTA) 200-200-20 MG/5ML suspension 30 mL  30 mL Oral Q4H PRN Delfin Gant, NP      . ARIPiprazole (ABILIFY) tablet 5 mg  5 mg Oral Daily Jenne Campus, MD   5 mg at 04/11/16 0803  . busPIRone (BUSPAR) tablet 10 mg  10 mg Oral BID Kerrie Buffalo, NP   10 mg at 04/11/16 0803  . docusate sodium (COLACE) capsule 100 mg  100 mg Oral Daily Encarnacion Slates, NP   100 mg at 04/11/16 0803  . FLUoxetine (PROZAC) tablet 20 mg  20 mg Oral Daily Delfin Gant, NP   20 mg at 04/11/16 0803  . LORazepam (ATIVAN) tablet 0.5 mg  0.5 mg Oral Q6H PRN Jenne Campus, MD   0.5 mg at 04/09/16 9390  . losartan (COZAAR) tablet 50 mg  50 mg Oral Daily Jenne Campus, MD   50 mg at 04/11/16 0803  . magnesium hydroxide (MILK OF MAGNESIA) suspension 30 mL  30 mL Oral Daily PRN Delfin Gant, NP      . promethazine (PHENERGAN) tablet 25 mg  25 mg Oral Q6H PRN Delfin Gant, NP      . traZODone (DESYREL) tablet 100 mg  100 mg Oral QHS PRN Jenne Campus, MD        Lab Results: No results found for this or any previous visit (from the past 48 hour(s)).  Blood Alcohol level:  Lab Results  Component Value Date   [facility] <5 04/04/2016   ETH <11 06/21/2013    Physical Findings: AIMS: Facial and Oral Movements Muscles of Facial Expression: None, normal Lips and Perioral Area: None, normal Jaw: None, normal Tongue: None, normal,Extremity Movements Upper (arms, wrists, hands, fingers): None, normal Lower (legs, knees, ankles, toes): None, normal, Trunk Movements Neck, shoulders, hips: None, normal, Overall Severity Severity of abnormal  movements (highest score from questions above): None, normal Incapacitation due to abnormal movements: None, normal Patient's awareness of abnormal movements (rate only patient's report): No Awareness, Dental Status Current problems with teeth and/or dentures?: No Does patient usually wear dentures?: No  CIWA:  CIWA-Ar Total: 3 COWS:  COWS Total Score: 3  Musculoskeletal: Strength & Muscle Tone: within normal limits Gait & Station: normal Patient leans: N/A  Psychiatric Specialty Exam: ROS  Frequent headaches, subjective feeling of " brain fog ".  No chest pain, no shortness of breath  Blood pressure 124/83, pulse 93, temperature 98.1 F (36.7 C), temperature source Oral, resp. rate 16, height 5' 5"  (1.651 m), weight 195 lb (88.451 kg).Body mass index is 32.45 kg/(m^2).  General Appearance: improved grooming   Eye Contact::  Good  Speech:  Normal Rate  Volume:  Normal  Mood:  Reports mood as same, but mood seems partially improved   Affect:  Less constricted affect, smiles at times , remains anxious   Thought Process:  Linear  Orientation:  Full (Time, Place, and Person)  Thought Content:  ruminative about his physical health/symptoms, no hallucinations - somatic ruminations as above   Suicidal Thoughts:  No at this time denies any suicidal ideations, denies any self injurious ideations , but  does express concern about " how I will do when I leave"   Homicidal Thoughts:  No  Memory:  recent and remote grossly intact   Judgement:  Fair  Insight:  Fair  Psychomotor Activity:  Normal   Concentration:  Good  Recall:  Good  Fund of Knowledge:Good  Language: Good  Akathisia:  Negative  Handed:  Right  AIMS (if indicated):     Assets:  Communication Skills Desire for Improvement Resilience  ADL's:  Intact  Cognition: WNL  Sleep:  Number of Hours: 5.75  Assessment - patient presents with some improvement compared to admission- better groomed, more reactive affect, somewhat more future oriented, but remains somatically focused, and ruminative about perceived vague neurological symptoms, mainly a subjective sense of brain fog and mental exhaustion ( of note, presents fully alert, attentive, and has been fully oriented x 3, recall 3/3 immediate and 3/3 at 3 minutes )  At this time no medication side effects. Denies SI and contracts for safety on unit . Treatment Plan Summary: Daily contact with patient to assess and evaluate symptoms and progress in treatment, Medication management, Plan inpatient treatment  and medications as below  Encourage ongoing group and milieu participation to work on coping skills and symptom reduction Continue Prozac 20 mgrs QDAY for depression and anxiety Continue Abilify 5 mgrs QDAY  For somatic ruminations  Start Vitamin D supplement 1000 u per day- due to low serum Vitamin D  Continue Ativan 0.5 mgrs Q 6 hours PRN for anxiety as needed  Continue Trazodone 100 mgrs QHS PRN for insomnia as needed  Continue Losartan for HTN  Neita Garnet, MD 04/11/2016, 5:04 PM

## 2016-04-11 NOTE — Progress Notes (Signed)
Initial Nutrition Assessment  DOCUMENTATION CODES:   Not applicable  INTERVENTION:  -No sodium diet in Cafeteria   NUTRITION DIAGNOSIS:   Inadequate oral intake related to chronic illness as evidenced by per patient/family report.  GOAL:   Patient will meet greater than or equal to 90% of their needs  MONITOR:   PO intake, I & O's, Weight trends  REASON FOR ASSESSMENT:   Consult Assessment of nutrition requirement/status  ASSESSMENT:   Edwin Vaughan is a 45 year old Caucasian male. This is his first psychiatric admission to this hospital. [facility] of [facility] ideations with plans to overdose. During this assessment, Edwin Vaughan reports, "My sister drove me to the hospital the day before yesterday. My problems started last August.  Edwin Vaughan is a pleasant man who is suffering from suicidal ideations r/t depression and the passing of his mother - Jan 2017. Patient has had several mini strokes since his mom passed. 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 He also has a long running history of constipation. He exhibits an 18#/8.4% severe wt loss in 2 months r/t to depression and poor PO intake.  RD was consulted to help patient increase PO intake. Per MD, pt has not been eating because "there is too much sodium in the food." He states that he "would love to eat it, but to maintain my health I can't." patient refused to drink Ensure as well stating he drank them at home but could not related to the sodium content. I told him that Ensure Enlive only has about 200mg  of sodium in it, but he refused stating that he can do "No sodium." [place] [facility] [place] and spoke with Edwin Vaughan and Edwin Vaughan, two cooks. They have an allergy binder that documents special food requests/needs for patients. He is now documented to receive "NO SODIUM." Returned to patient and let him know, he was thankful.  Labs and Medications  reviewed. If anything else is needed, please consult RD again. Thank you  Diet Order:     Skin:  Reviewed, no issues  Last BM:  4/27  Height:   Ht Readings from Last 1 Encounters:  04/05/16 5\' 5"  (1.651 m)    Weight:   Wt Readings from Last 1 Encounters:  04/05/16 195 lb (88.451 kg)    Ideal Body Weight:  61.81 kg  BMI:  Body mass index is 32.45 kg/(m^2).  Estimated Nutritional Needs:   Kcal:  1700-2000 calories  Protein:  60-75 grams  Fluid:  >/= 1.7L  EDUCATION NEEDS:   No education needs identified at this time  Dionne AnoWilliam M. Juniper Snyders, MS, RD LDN After Hours/Weekend Pager [phone]

## 2016-04-11 NOTE — Progress Notes (Signed)
D: Patient denies SI/HI and A/V hallucinations; patient reports sleep is fair and the patient did not take any medications; reports appetite is fair ; reports energy level is low ; reports ability to concentrate is poor; rates depression as 10/10; rates hopelessness 10/10; rates anxiety as 10/10; patient has refused pain medication despite reporting pain to his knee and physician aware  A: Monitored q 15 minutes; patient encouraged to attend groups; patient educated about medications; patient given medications per physician orders; patient encouraged to express feelings and/or concerns; writer placed consult to spiritual care for the patient; patient affect brightened and he agreed   R: Patient stays in the bed at all times other than for medications and meals; patient is flat and guarded; patient often give one worded answers; patient is stoic in affect; patient's interaction with staff and peers is minimal and isolative; patient was able to set goal to talk with staff 1:1 when having feelings of SI; patient is taking medications as prescribed and tolerating medications; patient has not attended any groups

## 2016-04-11 NOTE — Tx Team (Signed)
Interdisciplinary Treatment Plan Update (Adult)  Date:  04/11/2016 Time Reviewed:  9:30am  Progress in Treatment: Attending groups: No. Participating in groups: No Taking medication as prescribed:  Yes. Tolerating medication:  Yes. Family/Significant othe contact made:  Yes, CSW has contacted sister  Edwin Vaughan (sister) [phone] Patient understands diagnosis:  Yes, as evidenced by seeking help with depression and anxiety. Discussing patient identified problems/goals with staff:  Yes, see initial care plan. Medical problems stabilized or resolved:  Yes Denies suicidal/homicidal ideation: Yes. Issues/concerns per patient self-inventory: No. Other:  New problem(s) identified:   Discharge Plan or Barriers: Patient plans to return to previous living situation to follow up with outpatient services.   Reason for Continuation of Hospitalization: Anxiety Depression Medication stabilization Suicidal ideation  Comments: Pt presenting with c/o feeling worsening anxiety and depression. He states that his mother passed away several months ago, he lives alone and is in a constant state of anxiety. States he cannot sleep, that he feels worried and guilty over many things. State he feels a spiritual guilt over sins in his life. States he was started on prozac approx 1 week ago but doesn't feel it is working. He is also concerned that he may be having a stroke- he states he knows that none of the tests have shown a stroke but he still feels that this is the case and cannot stop worrying about it. No weakness of arms or legs, no double vision, no changes in speech. Per prior chart review he had negative head CT 2 days ago, also had negative MRI approx 2 weeks ago- he has been seeeing a neurologist as an outpatient- with no definitive findings. He reports that he came to the ED today due to feeling that he may kill himself, states he does not want to do this but he fears he may. There are no other  associated systemic symptoms, there are no other alleviating or modifying factors. Prozac trial  Estimated length of stay: 1-2 days  New goal(s):  Review of initial/current patient goals per problem list:  1. Goal(s): Patient will participate in aftercare plan  Met:Yes  Target date: at discharge  As evidenced by: Patient will participate within aftercare plan AEB aftercare provider and housing plan at discharge being identified.   04/06/16: Pt will return home and follow-up outpt with [facility].  2. Goal (s): Patient will exhibit decreased depressive symptoms and suicidal ideations.  Met:No  Target date: at discharge  As evidenced by: Patient will utilize self rating of depression at 3 or below and demonstrate decreased signs of depression or be deemed stable for discharge by MD.  04/06/16: Pt reports passive SI with a plan. 4/28: Patient rates depression at 9, denies SI. 5/2: Patient rates depression at 10, denies SI.  3. Goal(s): Patient will demonstrate decreased signs and symptoms of anxiety.  Met:No   Target date: at discharge  As evidenced by: Patient will utilize self rating of anxiety at 3 or below and demonstrated decreased signs of anxiety, or be deemed stable for discharge by MD  04/06/16: Pt reports high levels of anxiety and is very fixated on his reoccurring strokes. Has had several MRIs that are normal, but pt's anxiety is still high.  4/28: Patient rates anxiety at 10. 5/2: Patient rates anxiety at 10.  Attendees: Patient:    Family:    Physician: Dr. Parke Poisson 04/11/2016 9:30 AM  Nursing: Eulogio Bear, Marilynne Halsted, RN 04/11/2016 9:30 AM  Clinical Social Worker: Tilden Fossa, LCSW 04/11/2016  9:30 AM  Other: Maxie Better, LCSW  04/11/2016 9:30 AM  Other: 04/11/2016 9:30 AM  Other: Lars Pinks, Case Manager 04/11/2016 9:30 AM  Other:  Agustina Caroli; Samuel Jester, NP 04/11/2016 9:30 AM  Other:    Other:     Tilden Fossa, LCSW Clinical Social  Worker [facility] [phone]

## 2016-04-11 NOTE — Plan of Care (Signed)
Problem: Alteration in mood Goal: LTG-Patient reports reduction in suicidal thoughts (Patient reports reduction in suicidal thoughts and is able to verbalize a safety plan for whenever patient is feeling suicidal)  Outcome: Progressing Pt denies SI and verbally contracts for safety.       

## 2016-04-11 NOTE — Progress Notes (Signed)
Pt has been in his room most of the evening.  He did not attend evening group.  He contracts for safety on the unit.  He denies HI/AVH.  Conversation was minimal with pt this evening.  He did not mention having any s/s of mini-stroke tonight.  When asked if he was having a good evening, he responded "yes", and smiled at Clinical research associatewriter.  He voiced no needs or concerns at that time.  Writer asked if he thought he might need a sleep aid as he did not have any scheduled meds, and he said he thought he would be able to go to sleep without one.  Support and encouragement offered.  Pt was encouraged to make his needs known to staff.  Discharge plans are in process.  Safety maintained with q15 minute checks.

## 2016-04-11 NOTE — Progress Notes (Signed)
Recreation Therapy Notes  Animal-Assisted Activity (AAA) Program Checklist/Progress Notes Patient Eligibility Criteria Checklist & Daily Group note for Rec Tx Intervention  Date: 05.02.2017 Time: 2:45pm [address]   AAA/T Program Assumption of Risk Form signed by Patient/ or Parent Legal Guardian Yes  Patient is free of allergies or sever asthma Yes  Patient reports no fear of animals Yes  Patient reports no history of cruelty to animals Yes  Patient understands his/her participation is voluntary Yes  Behavioral Response: Did not attend.    Marykay Lexenise L Onofrio Klemp, LRT/CTRS       Suzann Lazaro L 04/11/2016 3:02 PM

## 2016-04-12 MED ORDER — ARIPIPRAZOLE 10 MG PO TABS
10.0000 mg | ORAL_TABLET | Freq: Every day | ORAL | Status: DC
  Administered 2016-04-13 – 2016-04-14 (×2): 10 mg via ORAL
  Filled 2016-04-12 (×4): qty 1

## 2016-04-12 NOTE — [facility] Group Notes (Signed)
[facility] LCSW Aftercare Discharge Planning Group Note  04/12/2016  8:45 AM  Participation Quality: Did Not Attend. Patient invited to participate but declined.  Jimmie Dattilio, MSW, LCSW Clinical Social Worker [facility] [phone]   

## 2016-04-12 NOTE — Plan of Care (Signed)
Problem: Alteration in mood Goal: STG-Patient is able to discuss feelings and issues (Patient is able to discuss feelings and issues leading to depression)  Outcome: Progressing Pt verbalized feelings to Clinical research associatewriter. Pt verbalized feeling depressed due to "life".

## 2016-04-12 NOTE — Progress Notes (Signed)
Recreation Therapy Notes  Date: 05.03.2017 Time: 9:30am Location: [address] Group Room   Group Topic: Stress Management  Goal Area(s) Addresses:  Patient will actively participate in stress management techniques presented during session.   Behavioral Response: Did not attend.   Emric Kowalewski L Tayven Renteria, LRT/CTRS         Faruq Rosenberger L 04/12/2016 1:34 PM 

## 2016-04-12 NOTE — Progress Notes (Signed)
D: Pt presents with flat affect and depressed mood. Pt rates depression on self inventory sheet 10/10. Pt verbalized to writer 9/10. Pt rated anxiety on self inventory sheet 10/10 and verbalized to writer 8/10. Pt appears sad and withdrawn this morning. Pt isolates in his room and have no engagement with other pts on the unit. When asked if there are any stressors contributing to his depression, pt stated "life".  A: Medications administered as ordered per MD. Medications reviewed with pt.  Verbal support provided. Pt encouraged to attend groups. 15 minute checks performed safety.   R: Pt verbalized understanding of med regimen. Pt receptive to tx.

## 2016-04-12 NOTE — Progress Notes (Signed)
Patient ID: Edwin Vaughan, male   DOB: June 25, 1971, 45 y.o.   MRN: [id number] [facility] MD Progress Note  04/12/2016 1:17 PM Edwin Vaughan  MRN:  [id number] Subjective:  Patient continues to focus on perceived neurological issues, belief that he is having repeated CVAs. He states he feels a sense of mental exhaustion and a feeling of decreased mental acuity. He denies medication side effects. Objective : I have discussed case with treatment team and have met with patient. Patient remains depressed, somatically focused, as above, and having ideations that he has been having repeated CVAs. He is fixated on these , in spite of lack of any clear symptoms or sequelae of a CVA and negative neuroimaging /MRI tests . He does present with some improvement in the context of presenting with a fuller range of affect , smiling at times appropriately, and as discussed with Nursing staff he continues to describe high levels of depression and anxiety, but no longer scores them at max of 10/10 .  Some improved visibility on unit, but limited interactions with peers . No disruptive or agitated behaviors . Denies medication side effects. He is more future oriented at this time, reports returning home will be challenging due to social isolation and loneliness , but does not see other housing option as realistic for him at this time. Does express some interest in IOP type program on discharge, which would also help with preventing isolation and promote socialization .    Principal Problem: MDD (major depressive disorder), recurrent episode, severe ([place]) Diagnosis:   Patient Active Problem List   Diagnosis Date Noted  . Severe episode of recurrent major depressive disorder, without psychotic features ([place]) [F33.2]   . MDD (major depressive disorder), recurrent episode, severe ([place]) [F33.2] 04/05/2016  . 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 Snoring [R06.83] 03/13/2016  . Gasping for breath [R06.89]  03/13/2016  . Fatigue [R53.83] 03/01/2016  . PCP NOTES >>>>>>>>>>>>>>>>>>>>>>>>>>>>>>>. [Z09] 02/06/2016  . Depression [F32.9] 01/18/2016  . Generalized anxiety disorder [F41.1] 01/18/2016  . Left-sided weakness [M62.89] 01/17/2016  . Word finding difficulty [R47.89] 01/17/2016  . Blurred vision [H53.8] 01/17/2016  . Swallowing difficulty [R13.10] 01/17/2016  . Annual physical exam [J49.70] 08/10/2015  . Shellfish allergy [Z91.013] 08/10/2015  . Hypertension [I10] 08/05/2015  . Scrotal mass [N50.9] 08/19/2013  . 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 Anxiety [F41.9] 06/22/2013  . Insomnia [G47.00] 06/22/2013  . IBS (irritable bowel syndrome) [K58.9] 06/22/2013  . Neck mass [R22.1] 07/01/2012  . Contact dermatitis [L25.9] 03/13/2011   Total Time spent with patient:  25 minutes     Past Medical History:  Past Medical History  Diagnosis Date  . IBS (irritable bowel syndrome)   . Headache(784.0)     "most days recently related to my blood pressure" (06/17/2013)  . Environmental allergies     "pretty much anything that grows, I'm allergic to it" (06/17/2013)  . 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 Hypertension     takes Losartan daily  . Weakness     both hands and left leg .Numbness and tingling  . Arthritis     lower back   . Vitamin D deficiency     takes Vit D weekly  . Nocturia   . Depression     was on Lexapro but stopped a few months ago  . Insomnia     doesn't take any meds  . Complication of anesthesia     FYI: Egg and shellfish allergies  Past Surgical History  Procedure Laterality Date  . Wisdom tooth extraction  2000's  . Colonoscopy    . Radiology with anesthesia N/A 03/02/2016    Procedure: MRI OF BRAIN W/WO    (RADIOLOGY WITH ANESTHESIA);  Surgeon: Medication Radiologist, MD;  Location: [facility];  Service: Radiology;  Laterality: N/A;   Family History:  Family History  Problem Relation Age of Onset  . Hypertension Mother   . Stroke Mother   .  Colon cancer Father 27  . Prostate cancer Neg Hx   . Diabetes Neg Hx     Social History:  History  Alcohol Use No    Comment: 06/17/2013 "1-2 drinks/yr"     History  Drug Use No    Social History   Social History  . Marital Status: Single    Spouse Name: N/A  . Number of Children: 0  . Years of Education: 12+   Occupational History  . Unemployed    Social History Main Topics  . Smoking status: Never Smoker   . Smokeless tobacco: Never Used  . Alcohol Use: No     Comment: 06/17/2013 "1-2 drinks/yr"  . Drug Use: No  . Sexual Activity: No   Other Topics Concern  . None   Social History Narrative   No job at present,  Single,  No children.   Lives at mother's house , lost mother [name] 12-2015      Caffeine use: Coffee.tea- rare   Additional Social History:   Sleep:  Fair last night   Appetite:  improved   Current Medications: Current Facility-Administered Medications  Medication Dose Route Frequency Provider Last Rate Last Dose  . acetaminophen (TYLENOL) tablet 650 mg  650 mg Oral Q6H PRN Delfin Gant, NP      . alum & mag hydroxide-simeth (MAALOX/MYLANTA) 200-200-20 MG/5ML suspension 30 mL  30 mL Oral Q4H PRN Delfin Gant, NP      . ARIPiprazole (ABILIFY) tablet 5 mg  5 mg Oral Daily Jenne Campus, MD   5 mg at 04/12/16 0836  . busPIRone (BUSPAR) tablet 10 mg  10 mg Oral BID Kerrie Buffalo, NP   10 mg at 04/12/16 0836  . cholecalciferol (VITAMIN D) tablet 1,000 Units  1,000 Units Oral Daily Jenne Campus, MD   1,000 Units at 04/12/16 [phone]  . docusate sodium (COLACE) capsule 100 mg  100 mg Oral Daily Encarnacion Slates, NP   100 mg at 04/12/16 0835  . FLUoxetine (PROZAC) tablet 20 mg  20 mg Oral Daily Delfin Gant, NP   20 mg at 04/12/16 0835  . LORazepam (ATIVAN) tablet 0.5 mg  0.5 mg Oral Q6H PRN Jenne Campus, MD   0.5 mg at 04/09/16 1308  . losartan (COZAAR) tablet 50 mg  50 mg Oral Daily Jenne Campus, MD   50 mg at 04/12/16 0836  .  magnesium hydroxide (MILK OF MAGNESIA) suspension 30 mL  30 mL Oral Daily PRN Delfin Gant, NP      . promethazine (PHENERGAN) tablet 25 mg  25 mg Oral Q6H PRN Delfin Gant, NP      . traZODone (DESYREL) tablet 100 mg  100 mg Oral QHS PRN Jenne Campus, MD        Lab Results: No results found for this or any previous visit (from the past 48 hour(s)).  Blood Alcohol level:  Lab Results  Component Value Date   ETH <5 04/04/2016   ETH <  11 06/21/2013    Physical Findings: AIMS: Facial and Oral Movements Muscles of Facial Expression: None, normal Lips and Perioral Area: None, normal Jaw: None, normal Tongue: None, normal,Extremity Movements Upper (arms, wrists, hands, fingers): None, normal Lower (legs, knees, ankles, toes): None, normal, Trunk Movements Neck, shoulders, hips: None, normal, Overall Severity Severity of abnormal movements (highest score from questions above): None, normal Incapacitation due to abnormal movements: None, normal Patient's awareness of abnormal movements (rate only patient's report): No Awareness, Dental Status Current problems with teeth and/or dentures?: No Does patient usually wear dentures?: No  CIWA:  CIWA-Ar Total: 3 COWS:  COWS Total Score: 3  Musculoskeletal: Strength & Muscle Tone: within normal limits Gait & Station: normal Patient leans: N/A  Psychiatric Specialty Exam: ROS  Frequent headaches, subjective feeling of " brain fog ".  No chest pain, no shortness of breath  Blood pressure 113/83, pulse 113, temperature 98.3 F (36.8 C), temperature source Oral, resp. rate 16, height 5' 5"  (1.651 m), weight 195 lb (88.451 kg).Body mass index is 32.45 kg/(m^2).  General Appearance: improved grooming   Eye Contact::  Good  Speech:  Normal Rate  Volume:  Normal  Mood:  Reports ongoing depression, anxiety   Affect:  Less constricted affect, smiles at times   Thought Process:  Linear  Orientation:  Full (Time, Place, and Person)   Thought Content:  ruminative about his physical health/symptoms, no hallucinations - somatic ruminations as above   Suicidal Thoughts:  No at this time denies any suicidal ideations, denies any self injurious ideations at this time  , but does express concern about  Discharge- states " I don't know how I will function when I get out "   Homicidal Thoughts:  No  Memory:  recent and remote grossly intact   Judgement:  Fair  Insight:  Fair  Psychomotor Activity:  Normal   Concentration:  Good  Recall:  Good  Fund of Knowledge:Good  Language: Good  Akathisia:  Negative  Handed:  Right  AIMS (if indicated):     Assets:  Communication Skills Desire for Improvement Resilience  ADL's:  Intact  Cognition: WNL  Sleep:  Number of Hours: 4.25  Assessment -ongoing depression, anxiety, and somatic preoccupations, ruminations . Denies medication side effects, and seems less focused on possible side effects at this time. No akathisia. Denies suicidal ideations at present . Limited interaction with peers, tends to isolate .  Treatment Plan Summary: Daily contact with patient to assess and evaluate symptoms and progress in treatment, Medication management, Plan inpatient treatment  and medications as below  Encourage ongoing group and milieu participation to work on coping skills and symptom reduction Continue Prozac 20 mgrs QDAY for depression and anxiety Increase  Abilify to 10 mgrs QDAY  for  ongoing somatic ruminations /depression Start Vitamin D supplement 1000 u per day- due to low serum Vitamin D  Continue Ativan 0.5 mgrs Q 6 hours PRN for anxiety as needed  Continue Trazodone 100 mgrs QHS PRN for insomnia as needed  Continue Losartan for HTN Treatment team working on disposition planning- patient may benefit from IOP level of care on discharge.  Neita Garnet, MD 04/12/2016, 1:17 PM

## 2016-04-12 NOTE — Progress Notes (Signed)
Pt did not attend wrap-up group   

## 2016-04-12 NOTE — Progress Notes (Signed)
Patient ID: Edwin Vaughan, male   DOB: May 12, 1971, 45 y.o.   MRN: [id number] D: Client in room, reports of his day "fine" "visitors this evening" anxiety and depression "9" of 10. Client reports "meications helping with sleep at night" A: Writer encouraged group. Client encouraged to reports any concerns. Staff will monitor q815min for safety. R:Client is safe on the unit, did not attend group.

## 2016-04-13 MED ORDER — SERTRALINE HCL 25 MG PO TABS
75.0000 mg | ORAL_TABLET | Freq: Every day | ORAL | Status: DC
  Administered 2016-04-14: 75 mg via ORAL
  Filled 2016-04-13 (×2): qty 1

## 2016-04-13 MED ORDER — BUSPIRONE HCL 15 MG PO TABS
15.0000 mg | ORAL_TABLET | Freq: Two times a day (BID) | ORAL | Status: DC
  Administered 2016-04-13 – 2016-04-15 (×6): 15 mg via ORAL
  Filled 2016-04-13 (×11): qty 1

## 2016-04-13 MED ORDER — SERTRALINE HCL 50 MG PO TABS
50.0000 mg | ORAL_TABLET | Freq: Every day | ORAL | Status: DC
  Filled 2016-04-13: qty 1

## 2016-04-13 NOTE — Progress Notes (Signed)
Patient ID: Edwin Vaughan, male   DOB: May 13, 1971, 45 y.o.   MRN: [id number] D: client in room today also, reports depression and anxiety "8" of 10. [facility] "5" of 10. "break from being at home by myself, but I'm still leaving with unanswered questions" Client reports he will continue to see his therapist upon discharge. "it's physical issues, I'm dealing with" Client does not elaborate. I've asked to be discharged tomorrow, because it's nothing else can be done emotionally" A: Writer encouraged client to verbalize concerns. Group encouraged for support. Staff will monitor q7215min for safety. R: Client is safe on the unit, did not attend group.

## 2016-04-13 NOTE — Progress Notes (Signed)
[facility] Group Notes:  (Nursing/MHT/Case Management/Adjunct)  Date:  04/13/2016  Time:  10:24 AM  Type of Therapy:  Nurse Education  Participation Level:  Did Not Attend  Participation Quality:    Affect:   Cognitive:    Insight:    Engagement in Group:    Modes of Intervention:    Summary of Progress/Problems:  Beatrix ShipperWright, Romonda Parker Martin 04/13/2016, 10:24 AM

## 2016-04-13 NOTE — Progress Notes (Signed)
Adult Psychoeducational Group Note  Date:  04/13/2016 Time:  10:12 PM  Group Topic/Focus:  Wrap-Up Group:   The focus of this group is to help patients review their daily goal of treatment and discuss progress on daily workbooks.  Participation Level:  Did Not Attend  Participation Quality:  Did not attend  Affect:  Did not attend  Cognitive:  Did not attend  Insight: None  Engagement in Group:  Did not attend  Modes of Intervention:  Did not attend  Additional Comments:  Patient did not attend wrap up group tonight.  Dashawn Golda L Rocklyn Mayberry 04/13/2016, 10:12 PM

## 2016-04-13 NOTE — Progress Notes (Addendum)
Patient ID: Edwin Vaughan, male   DOB: [birth year], 45 y.o.   MRN: [id number] [facility] MD Progress Note  04/13/2016 4:48 PM BENNETT RAM  MRN:  [id number] Subjective:   Reports feeling " tired today", which he attributes to medications , and underlying neurologic symptoms .  Objective : I have discussed case with treatment team and have met with patient. Patient presents with some improvement compared to admission- although remains somatically focused, he is less intensely focused on perceived neurological issues, and only discussed them when specifically asked . States he feels pain affecting different areas of his head, mainly at night , along with sense of feeling mentally tired . Remains convinced he has been having CVAs. Denies any suicidal ideations, and repeatedly states he is hoping he will improve - states " I want to live, I want to go to the movies, meet someone, do what other people enjoy doing ". He has been somewhat  more active in milieu, going to some groups . States he was on Prozac in the past and does not feel this medication helped, and feels it may have caused some side effects. Wants to change to another antidepressant . We discussed options and agrees to Zoloft .  Today more focused on disposition planning , states he is looking forward to going home , because he feels he will be better able to rest there , with more comfortable furniture and his own bed .  I have encouraged him to consider IOP after discharge .     Principal Problem: MDD (major depressive disorder), recurrent episode, severe ([place]) Diagnosis:   Patient Active Problem List   Diagnosis Date Noted  . Severe episode of recurrent major depressive disorder, without psychotic features ([place]) [F33.2]   . MDD (major depressive disorder), recurrent episode, severe ([place]) [F33.2] 04/05/2016  . 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 Snoring [R06.83] 03/13/2016  . Gasping for breath [R06.89]  03/13/2016  . Fatigue [R53.83] 03/01/2016  . PCP NOTES >>>>>>>>>>>>>>>>>>>>>>>>>>>>>>>. [Z09] 02/06/2016  . Depression [F32.9] 01/18/2016  . Generalized anxiety disorder [F41.1] 01/18/2016  . Left-sided weakness [M62.89] 01/17/2016  . Word finding difficulty [R47.89] 01/17/2016  . Blurred vision [H53.8] 01/17/2016  . Swallowing difficulty [R13.10] 01/17/2016  . Annual physical exam [N56.21] 08/10/2015  . Shellfish allergy [Z91.013] 08/10/2015  . Hypertension [I10] 08/05/2015  . Scrotal mass [N50.9] 08/19/2013  . 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 Anxiety [F41.9] 06/22/2013  . Insomnia [G47.00] 06/22/2013  . IBS (irritable bowel syndrome) [K58.9] 06/22/2013  . Neck mass [R22.1] 07/01/2012  . Contact dermatitis [L25.9] 03/13/2011   Total Time spent with patient:  25 minutes     Past Medical History:  Past Medical History  Diagnosis Date  . IBS (irritable bowel syndrome)   . Headache(784.0)     "most days recently related to my blood pressure" (06/17/2013)  . Environmental allergies     "pretty much anything that grows, I'm allergic to it" (06/17/2013)  . 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 Hypertension     takes Losartan daily  . Weakness     both hands and left leg .Numbness and tingling  . Arthritis     lower back   . Vitamin D deficiency     takes Vit D weekly  . Nocturia   . Depression     was on Lexapro but stopped a few months ago  . Insomnia     doesn't take any meds  . Complication of  anesthesia     FYI: Egg and shellfish allergies    Past Surgical History  Procedure Laterality Date  . Wisdom tooth extraction  2000's  . Colonoscopy    . Radiology with anesthesia N/A 03/02/2016    Procedure: MRI OF BRAIN W/WO    (RADIOLOGY WITH ANESTHESIA);  Surgeon: Medication Radiologist, MD;  Location: [place];  Service: Radiology;  Laterality: N/A;   Family History:  Family History  Problem Relation Age of Onset  . Hypertension Mother   . Stroke Mother   .  Colon cancer Father 3  . Prostate cancer Neg Hx   . Diabetes Neg Hx     Social History:  History  Alcohol Use No    Comment: 06/17/2013 "1-2 drinks/yr"     History  Drug Use No    Social History   Social History  . Marital Status: Single    Spouse Name: N/A  . Number of Children: 0  . Years of Education: 12+   Occupational History  . Unemployed    Social History Main Topics  . Smoking status: Never Smoker   . Smokeless tobacco: Never Used  . Alcohol Use: No     Comment: 06/17/2013 "1-2 drinks/yr"  . Drug Use: No  . Sexual Activity: No   Other Topics Concern  . None   Social History Narrative   No job at present,  Single,  No children.   Lives at mother's house , lost mother [name] 12-2015      Caffeine use: Coffee.tea- rare   Additional Social History:   Sleep:  Fair last night   Appetite:  improved   Current Medications: Current Facility-Administered Medications  Medication Dose Route Frequency Provider Last Rate Last Dose  . acetaminophen (TYLENOL) tablet 650 mg  650 mg Oral Q6H PRN Delfin Gant, NP      . alum & mag hydroxide-simeth (MAALOX/MYLANTA) 200-200-20 MG/5ML suspension 30 mL  30 mL Oral Q4H PRN Delfin Gant, NP      . ARIPiprazole (ABILIFY) tablet 10 mg  10 mg Oral Daily Jenne Campus, MD   10 mg at 04/13/16 0818  . busPIRone (BUSPAR) tablet 10 mg  10 mg Oral BID Kerrie Buffalo, NP   10 mg at 04/13/16 0818  . cholecalciferol (VITAMIN D) tablet 1,000 Units  1,000 Units Oral Daily Jenne Campus, MD   1,000 Units at 04/13/16 [phone]  . docusate sodium (COLACE) capsule 100 mg  100 mg Oral Daily Encarnacion Slates, NP   100 mg at 04/13/16 0818  . FLUoxetine (PROZAC) tablet 20 mg  20 mg Oral Daily Delfin Gant, NP   20 mg at 04/13/16 0818  . LORazepam (ATIVAN) tablet 0.5 mg  0.5 mg Oral Q6H PRN Jenne Campus, MD   0.5 mg at 04/13/16 0552  . losartan (COZAAR) tablet 50 mg  50 mg Oral Daily Jenne Campus, MD   50 mg at 04/13/16 0818  .  magnesium hydroxide (MILK OF MAGNESIA) suspension 30 mL  30 mL Oral Daily PRN Delfin Gant, NP      . promethazine (PHENERGAN) tablet 25 mg  25 mg Oral Q6H PRN Delfin Gant, NP      . traZODone (DESYREL) tablet 100 mg  100 mg Oral QHS PRN Jenne Campus, MD        Lab Results: No results found for this or any previous visit (from the past 48 hour(s)).  Blood Alcohol level:  Lab  Results  Component Value Date   [facility] <5 04/04/2016   ETH <11 06/21/2013    Physical Findings: AIMS: Facial and Oral Movements Muscles of Facial Expression: None, normal Lips and Perioral Area: None, normal Jaw: None, normal Tongue: None, normal,Extremity Movements Upper (arms, wrists, hands, fingers): None, normal Lower (legs, knees, ankles, toes): None, normal, Trunk Movements Neck, shoulders, hips: None, normal, Overall Severity Severity of abnormal movements (highest score from questions above): None, normal Incapacitation due to abnormal movements: None, normal Patient's awareness of abnormal movements (rate only patient's report): No Awareness, Dental Status Current problems with teeth and/or dentures?: No Does patient usually wear dentures?: No  CIWA:  CIWA-Ar Total: 3 COWS:  COWS Total Score: 3  Musculoskeletal: Strength & Muscle Tone: within normal limits Gait & Station: normal Patient leans: N/A  Psychiatric Specialty Exam: ROS  Frequent headaches, subjective feeling of " brain fog ".  No chest pain, no shortness of breath  Blood pressure 111/82, pulse 102, temperature 98 F (36.7 C), temperature source Oral, resp. rate 16, height 5' 5" (1.651 m), weight 195 lb (88.451 kg).Body mass index is 32.45 kg/(m^2).  General Appearance: improved grooming   Eye Contact::  Good  Speech:  Normal Rate  Volume:  Normal  Mood:  Remains depressed   Affect:  Constricted, but more reactive, smiles appropriately at times    Thought Process:  Linear  Orientation:  Full (Time, Place, and Person)   Thought Content:  ruminative about his physical health/symptoms, no hallucinations - somatic ruminations as above   Suicidal Thoughts:  No at this time denies any suicidal ideations, denies any self injurious ideations at this time   Homicidal Thoughts:  No  Memory:  recent and remote grossly intact   Judgement:  Fair  Insight:  Fair  Psychomotor Activity:  Normal   Concentration:  Good  Recall:  Good  Fund of Knowledge:Good  Language: Good  Akathisia:  Negative  Handed:  Right  AIMS (if indicated):     Assets:  Communication Skills Desire for Improvement Resilience  ADL's:  Intact  Cognition: WNL  Sleep:  Number of Hours: 4.5  Assessment -some improvement in mood, but overall remains depressed, constricted in affect, and somatically focused, although less intensely so. Not suicidal . No side effects at this time, other than feeling somewhat sedated, but wanting to change Prozac, as he remembers not having tolerated it well in a past trial. Agrees to antidepressant trial, will switch to Zoloft .  Treatment Plan Summary: Daily contact with patient to assess and evaluate symptoms and progress in treatment, Medication management, Plan inpatient treatment  and medications as below  Encourage ongoing group and milieu participation to work on coping skills and symptom reduction D/C Prozac Start Zoloft 75  mgrs QDAY for depression and anxiety  Increase Buspar to 15 mgrs BID to address anxiety  Continue Abilify 10 mgrs QDAY  for  ongoing somatic delusions/  ruminations /depression Continue Vitamin D supplement 1000 u per day- due to low serum Vitamin D  Continue Ativan 0.5 mgrs Q 6 hours PRN for anxiety as needed  Continue Trazodone 100 mgrs QHS PRN for insomnia as needed  Continue Losartan for HTN Treatment team working on disposition planning- patient may benefit from IOP level of care on discharge.  Neita Garnet, MD 04/13/2016, 4:48 PM

## 2016-04-13 NOTE — Progress Notes (Signed)
D:Pt has been in his bed and did not attend groups this morning. He came to get his morning medication. Pt has a flat affect and received medication for anxiety earlier this morning. A:Offered support, encouragement and 15 minute checks. R:Pt denies si and hi. Safety maintained on the unit.

## 2016-04-13 NOTE — Plan of Care (Signed)
Problem: Alteration in mood Goal: LTG-Patient reports reduction in suicidal thoughts (Patient reports reduction in suicidal thoughts and is able to verbalize a safety plan for whenever patient is feeling suicidal)  Outcome: Progressing Pt denies si thoughts today     

## 2016-04-13 NOTE — [facility] Group Notes (Signed)
[facility] LCSW Group Therapy 04/13/2016 1:15 PM Type of Therapy: Group Therapy Participation Level: Active  Participation Quality: Attentive, Sharing and Supportive  Affect: Blunted   Cognitive: Alert and Oriented  Insight: Developing/Improving and Engaged  Engagement in Therapy: Developing/Improving and Engaged  Modes of Intervention: Activity, Clarification, Confrontation, Discussion, Education, Exploration, Limit-setting, Orientation, Problem-solving, Rapport Building, Reality Testing, Socialization and Support  Summary of Progress/Problems: Patient was attentive and engaged with speaker from [employer]. Patient was attentive to speaker while they shared their story of dealing with mental health and overcoming it. Patient expressed interest in their programs and services and received information on their agency. Patient processed ways they can relate to the speaker.   Ruth Kovich, LCSW Clinical Social Worker [facility] [phone]   

## 2016-04-14 MED ORDER — ARIPIPRAZOLE 15 MG PO TABS
15.0000 mg | ORAL_TABLET | Freq: Every day | ORAL | Status: DC
  Administered 2016-04-15 – 2016-04-16 (×2): 15 mg via ORAL
  Filled 2016-04-14 (×4): qty 1

## 2016-04-14 MED ORDER — SERTRALINE HCL 100 MG PO TABS
100.0000 mg | ORAL_TABLET | Freq: Every day | ORAL | Status: DC
  Administered 2016-04-15 – 2016-04-16 (×2): 100 mg via ORAL
  Filled 2016-04-14 (×4): qty 1

## 2016-04-14 NOTE — Progress Notes (Addendum)
Patient ID: Edwin Vaughan, male   DOB: Oct 30, 1971, 45 y.o.   MRN: [id number] [facility] MD Progress Note  04/14/2016 3:03 PM Edwin Vaughan  MRN:  [id number] Subjective:   Edwin Vaughan continues to report feeling tired , having intermittent headaches affecting different areas of head, and reports he feels his neck has been weakened. He continues to attribute this to having strokes . He denies medication side effects- at this time tolerating Zoloft ( which was started yesterday )  trial well , denies side effects. No akathisia or other side effects from Abilify. Denies suicidal ideations . States he is depressed, but that this is secondary to underlying neurological illness . He is future oriented, and states that on discharge plans to see a neurologist for a second opinion, but will also follow up with a psychiatrist and a therapist for outpatient care .  Objective : I have discussed case with treatment team and have met with patient. Patient is pleasant , cooperative on approach. Has tended to isolate in room, with limited group participation , limited interactions with peers. As above, denies medication side effects. He was recently switched to Zoloft , and is on Buspar and Abilify.  States he is aware that psychiatric medications may have a therapeutic lag before they " kick in" and  improve symptoms gradually , reports motivation to continue taking them .  States " really my issue is more neurologic than psychiatric",  And is wanting to discharge home soon. As above, denies any suicidal ideations and has not exhibited any self injurious behaviors on unit.      Principal Problem: MDD (major depressive disorder), recurrent episode, severe ([place]) Diagnosis:   Patient Active Problem List   Diagnosis Date Noted  . Severe episode of recurrent major depressive disorder, without psychotic features ([place]) [F33.2]   . MDD (major depressive disorder), recurrent episode, severe ([place]) [F33.2] 04/05/2016  . Insomnia  due to mental condition [F48.9, F51.05] 03/13/2016  . Snoring [R06.83] 03/13/2016  . Gasping for breath [R06.89] 03/13/2016  . Fatigue [R53.83] 03/01/2016  . PCP NOTES >>>>>>>>>>>>>>>>>>>>>>>>>>>>>>>. [Z09] 02/06/2016  . Depression [F32.9] 01/18/2016  . Generalized anxiety disorder [F41.1] 01/18/2016  . Left-sided weakness [M62.89] 01/17/2016  . Word finding difficulty [R47.89] 01/17/2016  . Blurred vision [H53.8] 01/17/2016  . Swallowing difficulty [R13.10] 01/17/2016  . Annual physical exam [N46.27] 08/10/2015  . Shellfish allergy [Z91.013] 08/10/2015  . Hypertension [I10] 08/05/2015  . Scrotal mass [N50.9] 08/19/2013  . 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 Anxiety [F41.9] 06/22/2013  . Insomnia [G47.00] 06/22/2013  . IBS (irritable bowel syndrome) [K58.9] 06/22/2013  . Neck mass [R22.1] 07/01/2012  . Contact dermatitis [L25.9] 03/13/2011   Total Time spent with patient:  25 minutes     Past Medical History:  Past Medical History  Diagnosis Date  . IBS (irritable bowel syndrome)   . Headache(784.0)     "most days recently related to my blood pressure" (06/17/2013)  . Environmental allergies     "pretty much anything that grows, I'm allergic to it" (06/17/2013)  . 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 Hypertension     takes Losartan daily  . Weakness     both hands and left leg .Numbness and tingling  . Arthritis     lower back   . Vitamin D deficiency     takes Vit D weekly  . Nocturia   . Depression     was on Lexapro but stopped a few months ago  . Insomnia  doesn't take any meds  . Complication of anesthesia     FYI: Egg and shellfish allergies    Past Surgical History  Procedure Laterality Date  . Wisdom tooth extraction  2000's  . Colonoscopy    . Radiology with anesthesia N/A 03/02/2016    Procedure: MRI OF BRAIN W/WO    (RADIOLOGY WITH ANESTHESIA);  Surgeon: Medication Radiologist, MD;  Location: [place];  Service: Radiology;  Laterality: N/A;    Family History:  Family History  Problem Relation Age of Onset  . Hypertension Mother   . Stroke Mother   . Colon cancer Father 77  . Prostate cancer Neg Hx   . Diabetes Neg Hx     Social History:  History  Alcohol Use No    Comment: 06/17/2013 "1-2 drinks/yr"     History  Drug Use No    Social History   Social History  . Marital Status: Single    Spouse Name: N/A  . Number of Children: 0  . Years of Education: 12+   Occupational History  . Unemployed    Social History Main Topics  . Smoking status: Never Smoker   . Smokeless tobacco: Never Used  . Alcohol Use: No     Comment: 06/17/2013 "1-2 drinks/yr"  . Drug Use: No  . Sexual Activity: No   Other Topics Concern  . None   Social History Narrative   No job at present,  Single,  No children.   Lives at mother's house , lost mother [name] 12-2015      Caffeine use: Coffee.tea- rare   Additional Social History:   Sleep:  Improving   Appetite:  improved   Current Medications: Current Facility-Administered Medications  Medication Dose Route Frequency Provider Last Rate Last Dose  . acetaminophen (TYLENOL) tablet 650 mg  650 mg Oral Q6H PRN Delfin Gant, NP      . alum & mag hydroxide-simeth (MAALOX/MYLANTA) 200-200-20 MG/5ML suspension 30 mL  30 mL Oral Q4H PRN Delfin Gant, NP      . Derrill Memo ON 04/15/2016] ARIPiprazole (ABILIFY) tablet 15 mg  15 mg Oral Daily Myer Peer Cobos, MD      . busPIRone (BUSPAR) tablet 15 mg  15 mg Oral BID Jenne Campus, MD   15 mg at 04/14/16 4709  . cholecalciferol (VITAMIN D) tablet 1,000 Units  1,000 Units Oral Daily Jenne Campus, MD   1,000 Units at 04/14/16 [phone]  . docusate sodium (COLACE) capsule 100 mg  100 mg Oral Daily Encarnacion Slates, NP   100 mg at 04/14/16 6629  . LORazepam (ATIVAN) tablet 0.5 mg  0.5 mg Oral Q6H PRN Jenne Campus, MD   0.5 mg at 04/14/16 0556  . losartan (COZAAR) tablet 50 mg  50 mg Oral Daily Jenne Campus, MD   50 mg at 04/14/16  4765  . magnesium hydroxide (MILK OF MAGNESIA) suspension 30 mL  30 mL Oral Daily PRN Delfin Gant, NP      . promethazine (PHENERGAN) tablet 25 mg  25 mg Oral Q6H PRN Delfin Gant, NP      . Derrill Memo ON 04/15/2016] sertraline (ZOLOFT) tablet 100 mg  100 mg Oral Daily Jenne Campus, MD        Lab Results: No results found for this or any previous visit (from the past 48 hour(s)).  Blood Alcohol level:  Lab Results  Component Value Date   [facility] <5 04/04/2016   ETH <11  06/21/2013    Physical Findings: AIMS: Facial and Oral Movements Muscles of Facial Expression: None, normal Lips and Perioral Area: None, normal Jaw: None, normal Tongue: None, normal,Extremity Movements Upper (arms, wrists, hands, fingers): None, normal Lower (legs, knees, ankles, toes): None, normal, Trunk Movements Neck, shoulders, hips: None, normal, Overall Severity Severity of abnormal movements (highest score from questions above): None, normal Incapacitation due to abnormal movements: None, normal Patient's awareness of abnormal movements (rate only patient's report): No Awareness, Dental Status Current problems with teeth and/or dentures?: No Does patient usually wear dentures?: No  CIWA:  CIWA-Ar Total: 3 COWS:  COWS Total Score: 3  Musculoskeletal: Strength & Muscle Tone: within normal limits Gait & Station: normal Patient leans: N/A  Psychiatric Specialty Exam: ROS  Frequent headaches, subjective feeling of " brain fog ".  No chest pain, no shortness of breath, no vomiting, no rash   Blood pressure 110/86, pulse 96, temperature 98.2 F (36.8 C), temperature source Oral, resp. rate 16, height _0  (1.651 m), weight 195 lb (88.451 kg).Body mass index is 32.45 kg/(m^2).  General Appearance: improved grooming   Eye Contact::  Good  Speech:  Normal Rate  Volume:  Normal  Mood:  depressed  Affect:  Constricted, but reactive and does smile at times appropriately   Thought Process:  Linear   Orientation:  Full (Time, Place, and Person)  Thought Content:  ruminative about his physical health/symptoms, no hallucinations - somatic ruminations as above   Suicidal Thoughts:  No at this time denies any suicidal ideations, denies any self injurious ideations at this time , future oriented - see above   Homicidal Thoughts:  No  Memory:  recent and remote grossly intact   Judgement:  Fair  Insight:  Fair  Psychomotor Activity:  Normal   Concentration:  Good  Recall:  Good  Fund of Knowledge:Good  Language: Good  Akathisia:  Negative  Handed:  Right  AIMS (if indicated):   no involuntary movements noted or reported, no akathisia   Assets:  Communication Skills Desire for Improvement Resilience  ADL's:  Intact  Cognition: WNL  Sleep:  Number of Hours: 5.5  Assessment - patient remains depressed, ruminative, somatically focused. No suicidal ideations and is future oriented, stating he plans to continue outpatient treatment and plans to get a second opinion and getting an MRA  from another neurologist after discharging. Thus far tolerating Zoloft trial well, no side effects from Abilify or Buspar . Some improvement of affect with support, empathy. Patient interested in discharging soon.  Treatment Plan Summary: Daily contact with patient to assess and evaluate symptoms and progress in treatment, Medication management, Plan inpatient treatment  and medications as below  Encourage ongoing group and milieu participation to work on coping skills and symptom reduction Increase Zoloft  To 100   mgrs QDAY for depression and anxiety  Continue  Buspar  15 mgrs BID to address anxiety  Increase  Abilify to 15  mgrs QDAY  for  ongoing somatic delusions/  ruminations /depression Continue Vitamin D supplement 1000 u per day- due to low serum Vitamin D  Continue Ativan 0.5 mgrs Q 6 hours PRN for anxiety as needed  D/C Trazodone PRNs , patient states not taking it .  Continue Losartan for  HTN Treatment team working on disposition planning- encouraged patient to consider participating in IOP program after discharge. Patient has history of hyponatremia in the past, will recheck, and also HgbA1C/ Lipid Panel , as on antipsychotic medication  Neita Garnet, MD 04/14/2016, 3:03 PM

## 2016-04-14 NOTE — Progress Notes (Signed)
Edwin Vaughan has been seen OOB UAL on the [facility] hall today he tolerates this well. HE completed his daily assessment and on it he wrote he deneid SI and he rated his depression, hopelessness and anxiety " 09/19/09", respectively A HE is quiet, keeps to himself. R safety in place and pt expecting to  DC tomorrow.

## 2016-04-14 NOTE — [facility] Group Notes (Signed)
[facility] LCSW Group Therapy 04/14/2016  1:15 PM   Type of Therapy: Group Therapy  Participation Level: Did Not Attend. Patient invited to participate but declined.   Reeves Musick, MSW, LCSW Clinical Social Worker [facility] [phone]   

## 2016-04-14 NOTE — Progress Notes (Signed)
  [facility] :  Will you be returning to the same living situation after discharge:  Yes,  patient plans to return home At discharge, do you have transportation home?: Yes,  friends/ family Do you have the ability to pay for your medications: Yes,  patient will be provided with prescriptions at discharge  Release of information consent forms completed and in the chart;  Patient's signature needed at discharge.  Patient to Follow up at: Follow-up Information    Follow up with [facility] On 04/20/2016.   Why:  @2pm  with Janalee DaneLarry Willet for therapy.    Contact information:   [address] [place], [place] [address]   (WE ARE AT THIS LOCATION ONLY UNTIL Apr 21, 2016. Moving to :  [address],  [address] [place], [place]  [address] ) Phone: [phone] Fax: [phone]       Follow up with [facility] On 04/26/2016.   Why:  @3 :15pm with Lowell Guitarheresa Hurst for med management.      Next level of care provider [facility]:no  Safety Planning and Suicide Prevention discussed: Yes,  with patient and sister  Have you used any form of tobacco in the last 30 days? (Cigarettes, Smokeless Tobacco, Cigars, and/or Pipes): No  Has patient been referred to the Quitline?: N/A patient is not a smoker  Patient has been referred for addiction treatment: Yes  Amirah Goerke, [place]boKristin L 04/14/2016, 3:33 PM

## 2016-04-14 NOTE — [facility] Group Notes (Signed)
[facility]  04/14/2016  8:45 AM  Participation Quality: Did Not Attend. Patient invited to participate but declined.  Samuella BruinKristin Alexiz Sustaita, MSW, LCSW Clinical Social Worker [facility] [phone]

## 2016-04-14 NOTE — Tx Team (Signed)
Interdisciplinary Treatment Plan Update (Adult)  Date:  04/14/2016 Time Reviewed:  9:30am  Progress in Treatment: Attending groups: No. Participating in groups: No Taking medication as prescribed:  Yes. Tolerating medication:  Yes. Family/Significant othe contact made:  Yes, CSW has contacted sister  Lisette Grinder (sister) [phone] Patient understands diagnosis:  Yes, as evidenced by seeking help with depression and anxiety. Discussing patient identified problems/goals with staff:  Yes, see initial care plan. Medical problems stabilized or resolved:  Yes Denies suicidal/homicidal ideation: Yes. Issues/concerns per patient self-inventory: No. Other:  New problem(s) identified:   Discharge Plan or Barriers: Patient plans to return to previous living situation to follow up with outpatient services.   Reason for Continuation of Hospitalization: Anxiety Depression Medication stabilization Suicidal ideation  Comments: Pt presenting with c/o feeling worsening anxiety and depression. He states that his mother passed away several months ago, he lives alone and is in a constant state of anxiety. States he cannot sleep, that he feels worried and guilty over many things. State he feels a spiritual guilt over sins in his life. States he was started on prozac approx 1 week ago but doesn't feel it is working. He is also concerned that he may be having a stroke- he states he knows that none of the tests have shown a stroke but he still feels that this is the case and cannot stop worrying about it. No weakness of arms or legs, no double vision, no changes in speech. Per prior chart review he had negative head CT 2 days ago, also had negative MRI approx 2 weeks ago- he has been seeeing a neurologist as an outpatient- with no definitive findings. He reports that he came to the ED today due to feeling that he may kill himself, states he does not want to do this but he fears he may. There are no other  associated systemic symptoms, there are no other alleviating or modifying factors. Prozac trial  Estimated length of stay: 1-2 days  New goal(s):  Review of initial/current patient goals per problem list:  1. Goal(s): Patient will participate in aftercare plan  Met:Yes  Target date: at discharge  As evidenced by: Patient will participate within aftercare plan AEB aftercare provider and housing plan at discharge being identified.   04/06/16: Pt will return home and follow-up outpt with [facility].  2. Goal (s): Patient will exhibit decreased depressive symptoms and suicidal ideations.  YYT:[id number] for discharge per MD  Target date: at discharge  As evidenced by: Patient will utilize self rating of depression at 3 or below and demonstrate decreased signs of depression or be deemed stable for discharge by MD.  04/06/16: Pt reports passive SI with a plan. 4/28: Patient rates depression at 9, denies SI. 5/2: Patient rates depression at 10, denies SI. 5/5: Adequate for discharge per MD. Patient with moderate depression at baseline, denies SI. He reports feeling safe to return home.   3. Goal(s): Patient will demonstrate decreased signs and symptoms of anxiety.  Met:No   Target date: at discharge  As evidenced by: Patient will utilize self rating of anxiety at 3 or below and demonstrated decreased signs of anxiety, or be deemed stable for discharge by MD  04/06/16: Pt reports high levels of anxiety and is very fixated on his reoccurring strokes. Has had several MRIs that are normal, but pt's anxiety is still high.  4/28: Patient rates anxiety at 10. 5/2: Patient rates anxiety at 10. 5/5: Adequate for discharge per MD. Patient with  moderate anxiety at baseline, reports feeling safe to return home.    Attendees: Patient:    Family:    Physician: Dr. Parke Poisson 04/14/2016 9:30 AM  Nursing: [place], RN 04/14/2016 9:30 AM  Clinical Social Worker:  Tilden Fossa, LCSW 04/14/2016 9:30 AM  Other:  04/14/2016 9:30 AM  Other: 04/14/2016 9:30 AM  Other: Lars Pinks, Case Manager 04/14/2016 9:30 AM  Other: Agustina Caroli; Samuel Jester, NP 04/14/2016 9:30 AM  Other:               Tilden Fossa, LCSW Clinical Social Worker [facility] [phone]

## 2016-04-14 NOTE — Progress Notes (Signed)
Recreation Therapy Notes  Date: 05.05.2017 Time: 9:30am Location: 30 Hall Group Room   Group Topic: Stress Management  Goal Area(s) Addresses:  Patient will actively participate in stress management techniques presented during session.   Behavioral Response: Did not attend   Madie Cahn L Lavern Maslow, LRT/CTRS        Kazim Corrales L 04/14/2016 10:00 AM 

## 2016-04-14 NOTE — [facility] Group Notes (Signed)
Adult Psychoeducational Group Note  Date:  04/14/2016 Time:  8:42 PM  Group Topic/Focus:  Wrap-Up Group:   The focus of this group is to help patients review their daily goal of treatment and discuss progress on daily workbooks.  Participation Level:  Minimal  Participation Quality:  Attentive  Affect:  Appropriate  Cognitive:  Appropriate  Insight: Good  Engagement in Group:  Limited  Modes of Intervention:  Discussion  Additional Comments:  Pt rated his day a 6 because he was tired and slept all day.  Pt didn't have a goal and is discharging tomorrow.  Caroll RancherLindsay, Elysia Grand A 04/14/2016, 8:42 PM

## 2016-04-15 ENCOUNTER — Encounter (HOSPITAL_COMMUNITY): Payer: Self-pay | Admitting: Registered Nurse

## 2016-04-15 LAB — LIPID PANEL
CHOLESTEROL: 109 mg/dL (ref 0–200)
HDL: 27 mg/dL — AB (ref >40)
LDL Cholesterol: 53 mg/dL (ref 0–99)
TRIGLYCERIDES: 143 mg/dL (ref <150)
Total CHOL/HDL Ratio: 4 RATIO
VLDL: 29 mg/dL (ref 0–40)

## 2016-04-15 LAB — BASIC METABOLIC PANEL
ANION GAP: 9 (ref 5–15)
BUN: 6 mg/dL (ref 6–20)
CALCIUM: 9.1 mg/dL (ref 8.9–10.3)
CO2: 30 mmol/L (ref 22–32)
Chloride: 94 mmol/L — ABNORMAL LOW (ref 101–111)
Creatinine, Ser: 0.71 mg/dL (ref 0.61–1.24)
Glucose, Bld: 106 mg/dL — ABNORMAL HIGH (ref 65–99)
Potassium: 3.5 mmol/L (ref 3.5–5.1)
Sodium: 133 mmol/L — ABNORMAL LOW (ref 135–145)

## 2016-04-15 MED ORDER — DOCUSATE SODIUM 100 MG PO CAPS: 100.0000 mg | ORAL_CAPSULE | Freq: Every day | ORAL | Status: DC

## 2016-04-15 MED ORDER — BUSPIRONE HCL 15 MG PO TABS: 15.0000 mg | ORAL_TABLET | Freq: Two times a day (BID) | ORAL | Status: DC

## 2016-04-15 MED ORDER — SERTRALINE HCL 100 MG PO TABS: 100.0000 mg | ORAL_TABLET | Freq: Every day | ORAL | Status: DC

## 2016-04-15 MED ORDER — ARIPIPRAZOLE 15 MG PO TABS: 15.0000 mg | ORAL_TABLET | Freq: Every day | ORAL | Status: DC

## 2016-04-15 MED ORDER — VITAMIN D3 25 MCG (1000 UNIT) PO TABS: 1000.0000 [IU] | ORAL_TABLET | Freq: Every day | ORAL | Status: AC

## 2016-04-15 NOTE — Progress Notes (Signed)
Patient ID: Edwin Vaughan, male   DOB: Sep 25, 1971, 45 y.o.   MRN: [id number] D: Client visible on the unit, up today in dayroom, some interaction noted with peers, smiling at times. Client acknowledges the calmer milieu is more inviting. Client also has visit from sister today, notes that even with sister in the area, "I will still be alone"  Client still preoccupied with health. "I have some physical things going on, I don't care to go into detail, just have to figure some things out" A: Writer encouraged client to verbalize concerns. Writer suggested some type of physical activity to help with physical challenges and socialization, but client declined. Staff will monitor q8615min for safety. R: Client is safe on the unit, attended group.

## 2016-04-15 NOTE — [facility] Group Notes (Signed)
:  [facility] LCSW Group Therapy  04/15/2016  / 10 AM  Type of Therapy:  Group Therapy  Participation Level:  Did Not Attend although encouraged to by CSW and MHT   Summary of Progress/Problems: The main focus of today's process group was for the patient to identify ways in which they have in the past sabotaged their own recovery. Motivational Interviewing was utilized to ask the group members what they get out of their self sabotaging behavior(s), and what reasons they may have for wanting to change.    Carney Bernatherine C Harrill, LCSW

## 2016-04-15 NOTE — Progress Notes (Signed)
D Pt is seen lying in his bed all day. He completed his daily assessment  This morning and on it he wrote he denied SI and he rated his depression, hopelessness  And anxiety " 8/10/9", respectively. A Mid day he ws scheduled to be discharged home. He became very anxious and stated he would " rather not go home today" He would not elaborate to this writer exactly what was bothering him...abut being dc'd home. This nurse asked what he thought would be any different, say tomorrow  or MOnday and again, he would not answer these questions. R He remins guarded. Anxious, uptight and isolative. R Safety in place.

## 2016-04-15 NOTE — [facility] Suicide Risk Assessment (Signed)
[facility]   Principal Problem: MDD (major depressive disorder), recurrent episode, severe (HCC) Discharge Diagnoses:  Patient Active Problem List   Diagnosis Date Noted  . Severe episode of recurrent major depressive disorder, without psychotic features (HCC) [F33.2]   . MDD (major depressive disorder), recurrent episode, severe (HCC) [F33.2] 04/05/2016  . 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 Snoring [R06.83] 03/13/2016  . Gasping for breath [R06.89] 03/13/2016  . Fatigue [R53.83] 03/01/2016  . PCP NOTES >>>>>>>>>>>>>>>>>>>>>>>>>>>>>>>. [Z09] 02/06/2016  . Depression [F32.9] 01/18/2016  . Generalized anxiety disorder [F41.1] 01/18/2016  . Left-sided weakness [M62.89] 01/17/2016  . Word finding difficulty [R47.89] 01/17/2016  . Blurred vision [H53.8] 01/17/2016  . Swallowing difficulty [R13.10] 01/17/2016  . Annual physical exam [Z00.00] 08/10/2015  . Shellfish allergy [Z91.013] 08/10/2015  . Hypertension [I10] 08/05/2015  . Scrotal mass [N50.9] 08/19/2013  . 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 Anxiety [F41.9] 06/22/2013  . Insomnia [G47.00] 06/22/2013  . IBS (irritable bowel syndrome) [K58.9] 06/22/2013  . Neck mass [R22.1] 07/01/2012  . Contact dermatitis [L25.9] 03/13/2011    Total Time spent with patient: 20 minutes  Musculoskeletal: Strength & Muscle Tone: within normal limits Gait & Station: normal Patient leans: normal  Psychiatric Specialty Exam: ROS  Blood pressure 117/88, pulse 83, temperature 97.9 F (36.6 C), temperature source Oral, resp. rate 16, height 5\' 5"  (1.651 m), weight 88.451 kg (195 lb).Body mass index is 32.45 kg/(m^2).  General Appearance: Fairly Groomed  Patent attorneyye Contact::  Fair  Speech:  Clear and Coherent409  Volume:  Decreased  Mood:  Anxious  Affect:  Restricted  Thought Process:  Coherent and Goal Directed  Orientation:  Full (Time, Place, and Person)  Thought Content:   plans as he moves on   Suicidal Thoughts:  No  Homicidal Thoughts:  No  Memory:  Immediate;   Fair Recent;   Fair Remote;   Fair  Judgement:  Fair  Insight:  Present  Psychomotor Activity:  Normal  Concentration:  Fair  Recall:  FiservFair  Fund of Knowledge:Fair  Language: Fair  Akathisia:  No  Handed:  Right  AIMS (if indicated):     Assets:  Housing  Sleep:  Number of Hours: 5.75  Cognition: WNL  ADL's:  Intact  Was dealing with a lot of stress. States the trigger was being told mother was going to die. Had HBP over 20 years. He did not handle it well. She died Jan 4. States he is single no children his life was tight up to his mother. He denies SI plans or intent. He is willing to reach out to other family for support Mental Status Per Nursing Assessment::   On Admission:     Demographic Factors:  Male and Caucasian  Loss Factors: Loss of significant relationship  Historical Factors: Family history of mental illness or substance abuse  Risk Reduction Factors:   wants to do better  Continued Clinical Symptoms:  Depression:   Insomnia Medical Diagnoses and Treatments/Surgeries  Cognitive Features That Contribute To Risk:  None    Suicide Risk:  Mild:  Suicidal ideation of limited frequency, intensity, duration, and specificity.  There are no identifiable plans, no associated intent, mild dysphoria and related symptoms, good self-control (both objective and subjective assessment), few other risk factors, and identifiable protective factors, including available and accessible social support.  Follow-up Information    Follow up with [facility] On 04/20/2016.   Why:  @2pm  with Janalee DaneLarry Willet for  therapy.    Contact information:   [address] [place], [place] [address]   (WE ARE AT THIS LOCATION ONLY UNTIL Apr 21, 2016. Moving to :  [address],  [address] [place], [place]  [address] ) Phone: [phone] Fax: [phone]        Follow up with [facility] On 04/26/2016.   Why:  :15pm with Lowell Guitar for med management.      Plan Of Care/Follow-up recommendations:  Activity:  as tolerated Diet:  regular Follow up as above Nadene Witherspoon A, MD 04/15/2016, 9:45 AM

## 2016-04-15 NOTE — Discharge Summary (Signed)
Physician Discharge Summary Note  Patient:  Edwin Vaughan is an 45 y.o., male MRN:  [id number] DOB:  1971/11/13 Patient phone:  [phone] (home)  Patient address:   [address] [place] [address],  Total Time spent with patient: 30 minutes  Date of Admission:  04/05/2016 Date of Discharge: 04/16/16  Reason for Admission:  Worsening depression/anxiety, suicidal thoughts with plan to overdose  Principal Problem: MDD (major depressive disorder), recurrent episode, severe [facility]) Discharge Diagnoses: Patient Active Problem List   Diagnosis Date Noted  . Severe episode of recurrent major depressive disorder, without psychotic features (HCC) [F33.2]   . MDD (major depressive disorder), recurrent episode, severe (HCC) [F33.2] 04/05/2016  . 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 Snoring [R06.83] 03/13/2016  . Gasping for breath [R06.89] 03/13/2016  . Fatigue [R53.83] 03/01/2016  . PCP NOTES >>>>>>>>>>>>>>>>>>>>>>>>>>>>>>>. [Z09] 02/06/2016  . Depression [F32.9] 01/18/2016  . Generalized anxiety disorder [F41.1] 01/18/2016  . Left-sided weakness [M62.89] 01/17/2016  . Word finding difficulty [R47.89] 01/17/2016  . Blurred vision [H53.8] 01/17/2016  . Swallowing difficulty [R13.10] 01/17/2016  . Annual physical exam [Z00.00] 08/10/2015  . Shellfish allergy [Z91.013] 08/10/2015  . Hypertension [I10] 08/05/2015  . Scrotal mass [N50.9] 08/19/2013  . 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 Anxiety [F41.9] 06/22/2013  . Insomnia [G47.00] 06/22/2013  . IBS (irritable bowel syndrome) [K58.9] 06/22/2013  . Neck mass [R22.1] 07/01/2012  . Contact dermatitis [L25.9] 03/13/2011    Past Psychiatric History: Major depression, Pornographic addiction   Past Medical History:  Past Medical History  Diagnosis Date  . IBS (irritable bowel syndrome)   . Headache(784.0)     "most days recently related to my blood pressure" (06/17/2013)  . Environmental  allergies     "pretty much anything that grows, I'm allergic to it" (06/17/2013)  . 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 Hypertension     takes Losartan daily  . Weakness     both hands and left leg .Numbness and tingling  . Arthritis     lower back   . Vitamin D deficiency     takes Vit D weekly  . Nocturia   . Depression     was on Lexapro but stopped a few months ago  . Insomnia     doesn't take any meds  . Complication of anesthesia     FYI: Egg and shellfish allergies    Past Surgical History  Procedure Laterality Date  . Wisdom tooth extraction  2000's  . Colonoscopy    . Radiology with anesthesia N/A 03/02/2016    Procedure: MRI OF BRAIN W/WO    (RADIOLOGY WITH ANESTHESIA);  Surgeon: Medication Radiologist, MD;  Location: [facility] OR;  Service: Radiology;  Laterality: N/A;   Family History:  Family History  Problem Relation Age of Onset  . Hypertension Mother   . Stroke Mother   . Colon cancer Father 91  . Prostate cancer Neg Hx   . Diabetes Neg Hx    Family Psychiatric  History: Anxiety disorder: Sister Social History:  History  Alcohol Use No    Comment: 06/17/2013 "1-2 drinks/yr"     History  Drug Use No    Social History   Social History  . Marital Status: Single    Spouse Name: N/A  . Number of Children: 0  . Years of Education: 12+   Occupational History  . Unemployed    Social History Main Topics  . Smoking status: Never Smoker   . Smokeless  tobacco: Never Used  . Alcohol Use: No     Comment: 06/17/2013 "1-2 drinks/yr"  . Drug Use: No  . Sexual Activity: No   Other Topics Concern  . None   Social History Narrative   No job at present,  Single,  No children.   Lives at mother's house , lost mother [name] 12-2015      Caffeine use: Coffee.tea- rare    Hospital Course:  Elonzo D Banes was admitted for MDD (major depressive disorder), recurrent episode, severe (HCC) and crisis management.  He was treated with the following medications Abilify 15 mg  daily for Major Depression and Mood Stabilization; Buspar 15 mg twice daily for Anxiety; Zoloft 100 mg daily for Major Depression.  Jaymere D Victor was discharged with current medication and was instructed on how to take medications as prescribed; (details listed below under Medication List).  Medical problems were identified and treated as needed.  Home medications were restarted as appropriate.  Improvement was monitored by observation and Raynelle Dick daily report of symptom reduction.  Emotional and mental status was monitored by daily self-inventory reports completed by Raynelle Dick and clinical staff.         Jakevious D Rathe was evaluated by the treatment team for stability and plans for continued recovery upon discharge.  Jamill D Speagle motivation was an integral factor for scheduling further treatment.  Employment, transportation, bed availability, health status, family support, and any pending legal issues were also considered during his hospital stay.  He was offered further treatment options upon discharge including but not limited to Residential, Intensive Outpatient, and Outpatient treatment.  Nimai D Hyun will follow up with the services as listed below under Follow Up Information.     Upon completion of this admission the Larsen D Rowand was both mentally and medically stable for discharge denying suicidal/homicidal ideation, auditory/visual/tactile hallucinations, delusional thoughts and paranoia.       Physical Findings: AIMS: Facial and Oral Movements Muscles of Facial Expression: None, normal Lips and Perioral Area: None, normal Jaw: None, normal Tongue: None, normal,Extremity Movements Upper (arms, wrists, hands, fingers): None, normal Lower (legs, knees, ankles, toes): None, normal, Trunk Movements Neck, shoulders, hips: None, normal, Overall Severity Severity of abnormal movements (highest score from questions above): None, normal Incapacitation due to abnormal movements:  None, normal Patient's awareness of abnormal movements (rate only patient's report): No Awareness, Dental Status Current problems with teeth and/or dentures?: No Does patient usually wear dentures?: No  CIWA:  CIWA-Ar Total: 3 COWS:  COWS Total Score: 3  Musculoskeletal: Strength & Muscle Tone: within normal limits Gait & Station: normal Patient leans: N/A  Psychiatric Specialty Exam:  See Suicide Risk Assessment Review of Systems  Psychiatric/Behavioral: Negative for suicidal ideas, hallucinations, memory loss and substance abuse. Depression: Stable. The patient does not have insomnia. Nervous/anxious: Stable.   All other systems reviewed and are negative.   Blood pressure 117/88, pulse 83, temperature 97.9 F (36.6 C), temperature source Oral, resp. rate 16, height 5\' 5"  (1.651 m), weight 88.451 kg (195 lb).Body mass index is 32.45 kg/(m^2).  Have you used any form of tobacco in the last 30 days? (Cigarettes, Smokeless Tobacco, Cigars, and/or Pipes): No  Has this patient used any form of tobacco in the last 30 days? (Cigarettes, Smokeless Tobacco, Cigars, and/or Pipes) Yes, No  Blood Alcohol level:  Lab Results  Component Value Date   [facility] <5 04/04/2016   ETH <11 06/21/2013    Metabolic Disorder  Labs:  Lab Results  Component Value Date   HGBA1C 5.8* 02/04/2016   MPG 120* 02/04/2016   No results found for: PROLACTIN Lab Results  Component Value Date   CHOL 146 08/10/2015   TRIG 190.0* 08/10/2015   HDL 30.00* 08/10/2015   CHOLHDL 5 08/10/2015   VLDL 38.0 08/10/2015   LDLCALC 78 08/10/2015    See Psychiatric Specialty Exam and Suicide Risk Assessment completed by Attending Physician prior to discharge.  Discharge destination:  Home  Is patient on multiple antipsychotic therapies at discharge:  No   Has Patient had three or more failed trials of antipsychotic monotherapy by history:  No  Recommended Plan for Multiple Antipsychotic Therapies: NA      Discharge  Instructions    Activity as tolerated - No restrictions    Complete by:  As directed      Diet general    Complete by:  As directed      Discharge instructions    Complete by:  As directed   Take all of you medications as prescribed by your mental healthcare provider.  Report any adverse effects and reactions from your medications to your outpatient provider promptly.  Do not engage in alcohol and or illegal drug use while on prescription medicines. Keep all scheduled appointments. This is to ensure that you are getting refills on time and to avoid any interruption in your medication.  If you are unable to keep an appointment call to reschedule.  Be sure to follow up with resources and follow ups given. In the event of worsening symptoms call the crisis hotline, 911, and or go to the nearest emergency department for appropriate evaluation and treatment of symptoms. Follow-up with your primary care provider for your medical issues, concerns and or health care needs.            Medication List    STOP taking these medications        FLUoxetine 20 MG tablet  Commonly known as:  PROZAC      TAKE these medications      Indication   ARIPiprazole 15 MG tablet  Commonly known as:  ABILIFY  Take 1 tablet (15 mg total) by mouth daily.   Indication:  Major Depressive Disorder, mood stabilization     aspirin 81 MG tablet  Take 81 mg by mouth daily.      busPIRone 15 MG tablet  Commonly known as:  BUSPAR  Take 1 tablet (15 mg total) by mouth 2 (two) times daily.   Indication:  Anxiety Disorder, Generalized Anxiety Disorder     cholecalciferol 1000 units tablet  Commonly known as:  VITAMIN D  Take 1 tablet (1,000 Units total) by mouth daily.   Indication:  Vitamin D Deficiency     docusate sodium 100 MG capsule  Commonly known as:  COLACE  Take 1 capsule (100 mg total) by mouth daily.   Indication:  Constipation     losartan 50 MG tablet  Commonly known as:  COZAAR  Take 50 mg by  mouth daily.      promethazine 25 MG tablet  Commonly known as:  PHENERGAN  Take 1 tablet (25 mg total) by mouth every 6 (six) hours as needed for nausea or vomiting (at first sign of headache).      sertraline 100 MG tablet  Commonly known as:  ZOLOFT  Take 1 tablet (100 mg total) by mouth daily.   Indication:  Major Depressive Disorder  Follow-up Information    Follow up with [facility] On 04/20/2016.   Why:  @2pm  with Janalee Dane for therapy.    Contact information:   [address] [place], [place] [address]   (WE ARE AT THIS LOCATION ONLY UNTIL Apr 21, 2016. Moving to :  [address],  [address] [place], [place]  [address] ) Phone: [phone] Fax: [phone]       Follow up with [facility] On 04/26/2016.   Why:  @3 :15pm with Lowell Guitar for med management.      Follow-up recommendations:  Activity:  As tolerated Diet:  As tolerated  Comments: Claudy D Hatler has been instructed to take medications as prescribed; and report adverse effects to outpatient provider.  Follow up with primary doctor for any medical issues and If symptoms recur report to nearest emergency or crisis hot line.    SignedAssunta Found, NP 04/15/2016, 10:31 AM  I personally assessed the patient and formulated the plan Madie Reno A. Dub Mikes, M.D.

## 2016-04-15 NOTE — Plan of Care (Signed)
Problem: Alteration in mood; excessive anxiety as evidenced by: Goal: LTG-Patient's behavior demonstrates decreased anxiety (Patient's behavior demonstrates anxiety and he/she is utilizing learned coping skills to deal with anxiety-producing situations)  Outcome: Progressing Client exhibiting decreased anxiety AEB spending at least an hour on the milieu talking to peers, watching TV, and smiling occassionally.

## 2016-04-16 NOTE — [facility] Group Notes (Signed)
[facility] LCSW Group Therapy Note   04/16/2016 10:15  Type of Therapy and Topic: Group Therapy: Feelings Around Returning Home & Establishing a Supportive Framework and Activity to Identify signs of Improvement or Decompensation   Participation Level: Active   Description of Group:  Patients first processed thoughts and feelings about up coming discharge. These included fears of upcoming changes, lack of change, new living environments, judgements and expectations from others and overall stigma of MH issues. We then discussed what is a supportive framework? What does it look like feel like and how do I discern it from and unhealthy non-supportive network? Learn how to cope when supports are not helpful and don't support you. Discuss what to do when your family/friends are not supportive.   Therapeutic Goals Addressed in Processing Group:  1. Patient will identify one healthy supportive network that they can use at discharge. 2. Patient will identify one factor of a supportive framework and how to tell it from an unhealthy network. 3. Patient able to identify one coping skill to use when they do not have positive supports from others. 4. Patient will demonstrate ability to communicate their needs through discussion and/or role plays.  Summary of Patient Progress:  Pt engaged easily during group session. As patients processed their anxiety about discharge and described healthy supports patient shared how his medical wellbeing can often affect his emotional well being.  Patient chose a visual to represent decompensation as feeling overwhelmed and improvement as being able to sleep.   Edwin Bernatherine C Abrahm Mancia, LCSW

## 2016-04-16 NOTE — Progress Notes (Signed)
Edwin Vaughan completed his daily assessment first thing this morning and on it he wrote he denied SI today and he rated his depression, hopelessness and anxiety " 09/19/09", respectively . He is seen standing in the hallway this morning, after breakfast , and when this nurse asks him if he is going to be dc'd home today he says " yes, I am".  A  He is given his dc paperwork as planned, the DC SRA, AVS and transition records are all reviewed with him, he stated understanding and he is given prescriptions for his meds and belongins uin his locker are returned to him. He is escorted to bldg entrance.

## 2016-04-16 NOTE — Progress Notes (Signed)
  D: Pt informed the writer that he was scheduled to be discharged today. Stated, "I panicked because I supposed to be discharged". Stated that though this isn't better than being in his home, "at least he's not alone". Pt also states he has "other medical problems".  Pt has no questions or concerns.   A:  Support and encouragement was offered. 15 min checks continued for safety.  R: Pt remains safe.

## 2016-04-16 NOTE — Progress Notes (Signed)
Patient ID: Raynelle DickKermit D Coulthard, male   DOB: 1971-08-23, 45 y.o.   MRN: [id number]  Patient was discharged morning of 04/15/16 but when time to leave refused to go stating that he was not ready.  Patient continued with the somatic complaints stating that he was still having the "mini strokes"  Discussed with patient that he would need to continue following up with the neurologist that has been treating him.  Patient stated that he wanted a second opinion.  Informed patient that he would need to continue with current doctor until he had an appointment with a new one in order to make sure he was continuing with treatment.  Patient voiced understanding.  Patient sister had left and stated that she could pick him up in the morning. Patient will continue with current neurologist that he is currently seeing and resource information will be given with local neurologist.  Patient also wants social work to help him with information on transportation to get to and from his doctor appointments  Shuvon B. Rankin FNP-BC  .  I agree with assessment and plan Madie Renorving A. Dub MikesLugo, M.D.

## 2016-04-16 NOTE — [facility] Group Notes (Signed)
[facility] Group Notes:  (Nursing/MHT/Case Management/Adjunct)  Date:  04/16/2016  Time:  0930  Type of Therapy:  Nurse Education  /  Healthy Support Systems : The group focuses on teaching patienst how to develop and maintain healthy support systems.   Participation Level:  Minimal  Participation Quality:  Attentive  Affect:  Depressed  Cognitive:  Alert  Insight:  Limited  Engagement in Group:  Engaged  Modes of Intervention:  Education  Summary of Progress/Problems:  Rich BraveDuke, Keefe Zawistowski Lynn 04/16/2016, 12:50 PM

## 2016-04-16 NOTE — [facility] Suicide Risk Assessment (Signed)
[facility] Discharge Suicide Risk Assessment   Principal Problem: MDD (major depressive disorder), recurrent episode, severe (HCC) Discharge Diagnoses:  Patient Active Problem List   Diagnosis Date Noted  . Severe episode of recurrent major depressive disorder, without psychotic features (HCC) [F33.2]   . MDD (major depressive disorder), recurrent episode, severe (HCC) [F33.2] 04/05/2016  . 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 Snoring [R06.83] 03/13/2016  . Gasping for breath [R06.89] 03/13/2016  . Fatigue [R53.83] 03/01/2016  . PCP NOTES >>>>>>>>>>>>>>>>>>>>>>>>>>>>>>>. [Z09] 02/06/2016  . Depression [F32.9] 01/18/2016  . Generalized anxiety disorder [F41.1] 01/18/2016  . Left-sided weakness [M62.89] 01/17/2016  . Word finding difficulty [R47.89] 01/17/2016  . Blurred vision [H53.8] 01/17/2016  . Swallowing difficulty [R13.10] 01/17/2016  . Annual physical exam [Z00.00] 08/10/2015  . Shellfish allergy [Z91.013] 08/10/2015  . Hypertension [I10] 08/05/2015  . Scrotal mass [N50.9] 08/19/2013  . 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 Anxiety [F41.9] 06/22/2013  . Insomnia [G47.00] 06/22/2013  . IBS (irritable bowel syndrome) [K58.9] 06/22/2013  . Neck mass [R22.1] 07/01/2012  . Contact dermatitis [L25.9] 03/13/2011    Total Time spent with patient: 20 minutes  Musculoskeletal: Strength & Muscle Tone: within normal limits Gait & Station: normal Patient leans: normal  Psychiatric Specialty Exam: ROS  Blood pressure 125/92, pulse 93, temperature 97.8 F (36.6 C), temperature source Oral, resp. rate 15, height  (1.651 m), weight 88.451 kg (195 lb).Body mass index is 32.45 kg/(m^2).  General Appearance: Fairly Groomed  Patent attorney::  Fair  Speech:  Clear and Coherent409  Volume:  Decreased  Mood:  Anxious  Affect:  Restricted  Thought Process:  Coherent and Goal Directed  Orientation:  Full (Time, Place, and Person)  Thought Content:   plans as he moves on   Suicidal Thoughts:  No  Homicidal Thoughts:  No  Memory:  Immediate;   Fair Recent;   Fair Remote;   Fair  Judgement:  Fair  Insight:  Present  Psychomotor Activity:  Normal  Concentration:  Fair  Recall:  [employer] of Knowledge:Fair  Language: Fair  Akathisia:  No  Handed:  Right  AIMS (if indicated):     Assets:  Housing  Sleep:  Number of Hours: 5.75  Cognition: WNL  ADL's:  Intact  Was dealing with a lot of stress. States the trigger was being told mother was going to die. Had HBP over 20 years. He did not handle it well. She died 12-22-2022. States he is single no children his life was tight up to his mother. He denies SI plans or intent. He is willing to reach out to other family for support. He is dealing with symptoms he feels are secondary to having had multiple minnie strokes Mental Status Per Nursing Assessment::   On Admission:     Demographic Factors:  Male and Caucasian  Loss Factors: Loss of significant relationship  Historical Factors: Family history of mental illness or substance abuse  Risk Reduction Factors:   wants to do better  Continued Clinical Symptoms:  Depression:   Insomnia Medical Diagnoses and Treatments/Surgeries  Cognitive Features That Contribute To Risk:  None    Suicide Risk:  Mild:  Suicidal ideation of limited frequency, intensity, duration, and specificity.  There are no identifiable plans, no associated intent, mild dysphoria and related symptoms, good self-control (both objective and subjective assessment), few other risk factors, and identifiable protective factors, including available and accessible social support.  Follow-up Information    Follow  up with [facility] On 04/20/2016.   Why:  @2pm  with Janalee DaneLarry Willet for therapy.    Contact information:   [address] [place], [place] [address]   (WE ARE AT THIS LOCATION ONLY UNTIL Apr 21, 2016. Moving to :  [address],   [address] [place], [place]  [address] ) Phone: [phone] Fax: [phone]       Follow up with [facility] On 04/26/2016.   Why:  @3 :15pm with Lowell Guitarheresa Hurst for med management.      Plan Of Care/Follow-up recommendations:  Activity:  as tolerated Diet:  regular Follow up as above Rowene Suto A, MD 04/16/2016, 2:42 PM

## 2016-04-17 ENCOUNTER — Telehealth: Payer: Self-pay | Admitting: Internal Medicine

## 2016-04-17 LAB — HEMOGLOBIN A1C
Hgb A1c MFr Bld: 5.8 % — ABNORMAL HIGH (ref 4.8–5.6)
Mean Plasma Glucose: 120 mg/dL

## 2016-04-17 NOTE — Telephone Encounter (Signed)
Please advise 

## 2016-04-17 NOTE — Telephone Encounter (Signed)
Transition Care Management Follow-up Telephone Call  Patient: Edwin DickKermit D Surita is an 45 y.o., male MRN: [id number] DOB: 1971/07/25 Patient phone: [phone] (home) Patient [place]  [address] [place] [address],  Date of Admission: 04/05/2016 Date of Discharge: 04/16/16  Reason for Admission: Worsening depression/anxiety, suicidal thoughts with plan to overdose  Principal Problem: MDD (major depressive disorder), recurrent episode, severe (HCC)   How have you been since you were released from the hospital? "I'm still working through some things."   Do you understand why you were in the hospital? yes   Do you understand the discharge instructions? yes   Where were you discharged to? Home   Items Reviewed:  Medications reviewed: yes  Allergies reviewed: yes  Dietary changes reviewed: no  Referrals reviewed: yes, [facility]    Functional Questionnaire:   Activities of Daily Living (ADLs):   He states they are independent in the following: ambulation, bathing and hygiene, feeding, continence, grooming, toileting and dressing States they require assistance with the following: none   Any transportation issues/concerns?: yes, "I don't really know what's going on with my brain. I've been a bit confused." Pt states he will have to take an Benedetto GoadUber to his appointment.   Any patient concerns? Yes, pt states he is supposed to see neurology, but is not sure who to see. Has seen [facility] in the past.    Confirmed importance and date/time of follow-up visits scheduled yes  Provider Appointment booked with Dr. Willow OraJose Paz 04/18/16 @ 11:30am  Confirmed with patient if condition begins to worsen call PCP or go to the ER.  Patient was given the office number and encouraged to call back with question or concerns.  : yes

## 2016-04-17 NOTE — Telephone Encounter (Signed)
Recommend hospital follow-up first.

## 2016-04-17 NOTE — Telephone Encounter (Signed)
Pt seen on 04/01/2016 and 04/04/2016 at ED, needs hospital F/U please.

## 2016-04-17 NOTE — Telephone Encounter (Signed)
Caller name: Self  Can be reached: [phone]    Reason for call: Is requesting a second opinion from a Neurologist. Wants to be [facility] to [facility] for second opinion.

## 2016-04-18 ENCOUNTER — Encounter: Payer: Self-pay | Admitting: Internal Medicine

## 2016-04-18 ENCOUNTER — Ambulatory Visit (INDEPENDENT_AMBULATORY_CARE_PROVIDER_SITE_OTHER): Payer: BLUE CROSS/BLUE SHIELD | Admitting: Internal Medicine

## 2016-04-18 VITALS — BP 132/78 | HR 80 | Temp 98.1°F | Ht 65.0 in | Wt 188.0 lb

## 2016-04-18 DIAGNOSIS — F332 Major depressive disorder, recurrent severe without psychotic features: Secondary | ICD-10-CM

## 2016-04-18 DIAGNOSIS — Z09 Encounter for follow-up examination after completed treatment for conditions other than malignant neoplasm: Secondary | ICD-10-CM

## 2016-04-18 DIAGNOSIS — F32A Depression, unspecified: Secondary | ICD-10-CM

## 2016-04-18 DIAGNOSIS — F419 Anxiety disorder, unspecified: Secondary | ICD-10-CM

## 2016-04-18 DIAGNOSIS — F329 Major depressive disorder, single episode, unspecified: Secondary | ICD-10-CM

## 2016-04-18 DIAGNOSIS — F411 Generalized anxiety disorder: Secondary | ICD-10-CM

## 2016-04-18 DIAGNOSIS — I6320 Cerebral infarction due to unspecified occlusion or stenosis of unspecified precerebral arteries: Secondary | ICD-10-CM

## 2016-04-18 NOTE — Assessment & Plan Note (Signed)
Anxiety, major depression: S/p  admission with suicidal ideas, states today that he still has occasional suicidal ideas but no intentions. He was discharged on Abilify, BuSpar and sertraline but has not pick the prescription yet. Did not know  they were sent to the  Pharmacy. Feels very anxious. Plan: Pick up meds ASAP, ok Valium prn (has a left over); Keep f/u  with a counselor May 11 and his new  psychiatrists May 17.  ER if strong suicidal ideation Stroke? Still feels like he has a stroke and ongoing "problems with the brain", . I tried to reassure him >>  MRI negative, in the neurologist opinion he had no stroke, recurrent thoughts about a "brain problem"  likely due to anxiety. Patient would not be reassured  and strongly requires another neurology opinion. Will arrange  RTC 3 months

## 2016-04-18 NOTE — Progress Notes (Signed)
Pre visit review using our clinic review tool, if applicable. No additional management support is needed unless otherwise documented below in the visit note. 

## 2016-04-18 NOTE — Patient Instructions (Addendum)
Keep your appointments with the counselor and the psychiatrist at [facility]  Take Valium as needed for now   Pick up your medicines today  Go to the ER immediately if you have a strong suicidal thoughts  Next visit here in 3 months

## 2016-04-18 NOTE — Progress Notes (Signed)
Subjective:    Patient ID: Edwin Vaughan, male    DOB: [birth year], 45 y.o.   MRN: [id number]  DOS:  04/18/2016 Type of visit - description : Hospital follow-up Interval history:  Admitted to hospital 04-05-2016, discharge 04/16/2016 Worsening depression, anxiety, suicidal thoughts. Was managed by psychiatry. Was discharged on Abilify, BuSpar, sertraline. Labs reviewed: Sodium slightly low at 133. Cholesterol, CBC, TSH normal. A1c was stable at 5.8. Toxicology upon arrival negative  Review of Systems Since he left the hospital couple of days ago he has not been taking any medication, states he didn't know they sent the prescriptions. Reports is very anxious. Continue having occasional suicidal ideas.  No better or worse since he left the hospital. Continue to be concerned about a stroke. His main concern remains his future, he has no income, no work. Lives at the house his mother left him.   Past Medical History  Diagnosis Date  . IBS (irritable bowel syndrome)   . Headache(784.0)     "most days recently related to my blood pressure" (06/17/2013)  . Environmental allergies     "pretty much anything that grows, I'm allergic to it" (06/17/2013)  . 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 Hypertension     takes Losartan daily  . Weakness     both hands and left leg .Numbness and tingling  . Arthritis     lower back   . Vitamin D deficiency     takes Vit D weekly  . Nocturia   . Depression     was on Lexapro but stopped a few months ago  . Insomnia     doesn't take any meds  . Complication of anesthesia     FYI: Egg and shellfish allergies    Past Surgical History  Procedure Laterality Date  . Wisdom tooth extraction  2000's  . Colonoscopy    . Radiology with anesthesia N/A 03/02/2016    Procedure: MRI OF BRAIN W/WO    (RADIOLOGY WITH ANESTHESIA);  Surgeon: Medication Radiologist, MD;  Location: [facility] OR;  Service: Radiology;  Laterality: N/A;    Social History   Social  History  . Marital Status: Single    Spouse Name: N/A  . Number of Children: 0  . Years of Education: 12+   Occupational History  . Unemployed    Social History Main Topics  . Smoking status: Never Smoker   . Smokeless tobacco: Never Used  . Alcohol Use: No     Comment: 06/17/2013 "1-2 drinks/yr"  . Drug Use: No  . Sexual Activity: No   Other Topics Concern  . Not on file   Social History Narrative   No job at present,  Single,  No children.   Lives at mother's house , lost mother [name] 12-2015      Caffeine use: Coffee.tea- rare        Medication List       This list is accurate as of: 04/18/16  7:02 PM.  Always use your most recent med list.               ARIPiprazole 15 MG tablet  Commonly known as:  ABILIFY  Take 1 tablet (15 mg total) by mouth daily.     aspirin 81 MG tablet  Take 81 mg by mouth daily.     busPIRone 15 MG tablet  Commonly known as:  BUSPAR  Take 1 tablet (15 mg total) by mouth 2 (two) times daily.     cholecalciferol  1000 units tablet  Commonly known as:  VITAMIN D  Take 1 tablet (1,000 Units total) by mouth daily.     docusate sodium 100 MG capsule  Commonly known as:  COLACE  Take 1 capsule (100 mg total) by mouth daily.     losartan 50 MG tablet  Commonly known as:  COZAAR  Take 50 mg by mouth daily.     promethazine 25 MG tablet  Commonly known as:  PHENERGAN  Take 1 tablet (25 mg total) by mouth every 6 (six) hours as needed for nausea or vomiting (at first sign of headache).     sertraline 100 MG tablet  Commonly known as:  ZOLOFT  Take 1 tablet (100 mg total) by mouth daily.           Objective:   Physical Exam BP 132/78 mmHg  Pulse 80  Temp(Src) 98.1 F (36.7 C) (Oral)  Ht 5\' 5"  (1.651 m)  Wt 188 lb (85.276 kg)  BMI 31.28 kg/m2  SpO2 97% General:   Well developed, well nourished . NAD.  HEENT:  Normocephalic . Face symmetric, atraumatic Skin: Not pale. Not jaundice Neurologic:  alert & oriented X3.    Speech normal, gait appropriate for age and unassisted Psych--  Cognition and judgment appear intact.  Cooperative with normal attention span and concentration.  Behavior appropriate. Moderately anxious but nor depressed appearing.      Assessment & Plan:   Assessment  HTN PSYCH: ---Anxiety , major depression disorder ---insomnia  ---Binge eating d/o ---Psychogenic polydipsia  --- admitted 2014 , admitted 03-2016  IBS Chronic headaches MSK: Chronic back pain Shellfish Allergy Stroke?  concerned about a stroke in 2014  >> MRI (-) saw neuro (no issues found) Concerned again ~07-2015, CT head neg ; saw neuro 01-17-2016 , sx felt to be psych, rx a MRI: wnl   PLAN: Anxiety, major depression: S/p  admission with suicidal ideas, states today that he still has occasional suicidal ideas but no intentions. He was discharged on Abilify, BuSpar and sertraline but has not pick the prescription yet. Did not know  they were sent to the  Pharmacy. Feels very anxious. Plan: Pick up meds ASAP, ok Valium prn (has a left over); Keep f/u  with a counselor May 11 and his new  psychiatrists May 17.  ER if strong suicidal ideation Stroke? Still feels like he has a stroke and ongoing "problems with the brain", . I tried to reassure him >>  MRI negative, in the neurologist opinion he had no stroke, recurrent thoughts about a "brain problem"  likely due to anxiety. Patient would not be reassured  and strongly requires another neurology opinion. Will arrange  RTC 3 months

## 2016-05-03 ENCOUNTER — Ambulatory Visit: Payer: Self-pay | Admitting: Neurology

## 2016-06-05 ENCOUNTER — Ambulatory Visit: Payer: BLUE CROSS/BLUE SHIELD | Admitting: Neurology

## 2016-07-14 ENCOUNTER — Ambulatory Visit: Payer: BLUE CROSS/BLUE SHIELD | Admitting: Neurology

## 2016-08-08 ENCOUNTER — Encounter: Payer: BLUE CROSS/BLUE SHIELD | Admitting: Internal Medicine

## 2016-09-18 ENCOUNTER — Other Ambulatory Visit: Payer: Self-pay | Admitting: Internal Medicine

## 2016-12-01 ENCOUNTER — Other Ambulatory Visit: Payer: Self-pay

## 2016-12-05 ENCOUNTER — Encounter: Payer: Self-pay | Admitting: Internal Medicine

## 2016-12-22 ENCOUNTER — Encounter: Payer: Self-pay | Admitting: Internal Medicine

## 2016-12-22 ENCOUNTER — Ambulatory Visit (INDEPENDENT_AMBULATORY_CARE_PROVIDER_SITE_OTHER): Payer: Self-pay | Admitting: Internal Medicine

## 2016-12-22 VITALS — BP 132/76 | HR 91 | Temp 97.6°F | Resp 14 | Ht 65.0 in | Wt 229.1 lb

## 2016-12-22 DIAGNOSIS — E559 Vitamin D deficiency, unspecified: Secondary | ICD-10-CM

## 2016-12-22 DIAGNOSIS — Z0000 Encounter for general adult medical examination without abnormal findings: Secondary | ICD-10-CM

## 2016-12-22 LAB — BASIC METABOLIC PANEL
BUN: 9 mg/dL (ref 6–23)
CALCIUM: 9.3 mg/dL (ref 8.4–10.5)
CO2: 29 mEq/L (ref 19–32)
Chloride: 99 mEq/L (ref 96–112)
Creatinine, Ser: 0.8 mg/dL (ref 0.40–1.50)
GFR: 111.46 mL/min (ref >60.00)
Glucose, Bld: 111 mg/dL — ABNORMAL HIGH (ref 70–99)
POTASSIUM: 3.9 meq/L (ref 3.5–5.1)
SODIUM: 135 meq/L (ref 135–145)

## 2016-12-22 LAB — HEMOGLOBIN A1C: HEMOGLOBIN A1C: 5.7 % (ref 4.6–6.5)

## 2016-12-22 LAB — VITAMIN D 25 HYDROXY (VIT D DEFICIENCY, FRACTURES): VITD: 14.41 ng/mL — ABNORMAL LOW (ref 30.00–100.00)

## 2016-12-22 NOTE — Progress Notes (Signed)
Pre visit review using our clinic review tool, if applicable. No additional management support is needed unless otherwise documented below in the visit note. 

## 2016-12-22 NOTE — Patient Instructions (Signed)
GO TO THE LAB : Get the blood work     GO TO THE FRONT DESK Schedule your next appointment for a  routine checkup in 6 months   Try to stay active, eat healthy, take a vitamin D recommend, 1000 units, every day

## 2016-12-22 NOTE — Assessment & Plan Note (Addendum)
Tdap 2015; declined flu shot   CCS: + FH, reports 3 cscopes, last 2014, normal, next per GI Prostate cancer screening-- not indicated  Diet and exercise: Discussed Labs reviewed, FLP was very good. Will check a BMP, A1c, vitamin D  Vitamin D was low before, currently not taking supplements,  encouraged to do.

## 2016-12-22 NOTE — Progress Notes (Signed)
Subjective:    Patient ID: Edwin Vaughan, male    DOB: August 21, 1971, 46 y.o.   MRN: [id number]  DOS:  12/22/2016 Type of visit - description : CPX Interval history: Since the last time he was here, he has not seen psychiatry or taking any psychiatric medication.   Review of Systems   No new symptoms, continue to feel slightly tired, dizzy and forgetful.  Constitutional: No fever. No chills. No unexplained wt changes. No unusual sweats  HEENT: No dental problems, no ear discharge, no facial swelling, no voice changes. No eye discharge, no eye  redness , no  intolerance to light   Respiratory: No wheezing , no  difficulty breathing. No cough , no mucus production  Cardiovascular: No CP, no leg swelling , no  Palpitations  GI: Occasional GI symptoms d/t  IBS, at baseline   Endocrine: No polyphagia, no polyuria , no polydipsia  GU: No dysuria, gross hematuria, difficulty urinating. No urinary urgency, no frequency.  Musculoskeletal: No joint swellings or unusual aches or pains  Skin: No change in the color of the skin, palor , no  Rash  Allergic, immunologic: No environmental allergies , no  food allergies  Neurological:   no  syncope. No headaches. No diplopia, no slurred, no slurred speech, no motor deficits, no facial  Numbness  Hematological: No enlarged lymph nodes, no easy bruising , no unusual bleedings  Psychiatry: No suicidal ideas, no hallucinations, no beavior problems, no confusion.     Past Medical History:  Diagnosis Date  . Arthritis    lower back   . Complication of anesthesia    FYI: Egg and shellfish allergies  . Depression    was on Lexapro but stopped a few months ago  . Environmental allergies    "pretty much anything that grows, I'm allergic to it" (06/17/2013)  . Headache(784.0)    "most days recently related to my blood pressure" (06/17/2013)  . Hypertension    takes Losartan daily  . IBS (irritable bowel syndrome)   . Insomnia    doesn't take  any meds  . Nocturia   . 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 Vitamin D deficiency    takes Vit D weekly  . Weakness    both hands and left leg .Numbness and tingling    Past Surgical History:  Procedure Laterality Date  . COLONOSCOPY    . RADIOLOGY WITH ANESTHESIA N/A 03/02/2016   Procedure: MRI OF BRAIN W/WO    (RADIOLOGY WITH ANESTHESIA);  Surgeon: Medication Radiologist, MD;  Location: [facility] OR;  Service: Radiology;  Laterality: N/A;  . WISDOM TOOTH EXTRACTION  2000's    Social History   Social History  . Marital status: Single    Spouse name: N/A  . Number of children: 0  . Years of education: 12+   Occupational History  . Unemployed    Social History Main Topics  . Smoking status: Never Smoker  . Smokeless tobacco: Never Used  . Alcohol use No     Comment: 06/17/2013 "1-2 drinks/yr"  . Drug use: No  . Sexual activity: No   Other Topics Concern  . Not on file   Social History Narrative   No job at present, single,  No children.   Lives at mother's house ,  mother [name] died December 25, 2015       Family History  Problem Relation Age of Onset  . Hypertension Mother   . Stroke Mother   . Colon cancer Father 43  .  Prostate cancer Neg Hx   . Diabetes Neg Hx       Allergies as of 12/22/2016      Reactions   Eggs Or Egg-derived Products Anaphylaxis   Swelling in throat   Shellfish Allergy Anaphylaxis      Medication List       Accurate as of 12/22/16 11:59 PM. Always use your most recent med list.          aspirin 81 MG tablet Take 81 mg by mouth daily.   cholecalciferol 1000 units tablet Commonly known as:  VITAMIN D Take 1 tablet (1,000 Units total) by mouth daily.   losartan 50 MG tablet Commonly known as:  COZAAR take 1 tablet by mouth once daily          Objective:   Physical Exam BP 132/76 (BP Location: Left Arm, Patient Position: Sitting, Cuff Size: Normal)   Pulse 91   Temp 97.6 F (36.4 C) (Oral)   Resp 14   Ht 5\' 5"  (1.651 m)   Wt 229  lb 2 oz (103.9 kg)   SpO2 98%   BMI 38.13 kg/m  General:   Well developed, well nourished . NAD.  HEENT:  Normocephalic . Face symmetric, atraumatic Lungs:  CTA B Normal respiratory effort, no intercostal retractions, no accessory muscle use. Heart: RRR,  no murmur.  No pretibial edema bilaterally  Skin: Not pale. Not jaundice Neurologic:  alert & oriented X3.  Speech normal, gait appropriate for age and unassisted Psych--  Cognition and judgment appear intact.  Cooperative with normal attention span and concentration.  Behavior appropriate. No anxious or depressed appearing.      Assessment & Plan:   Assessment  HTN PSYCH: ---Anxiety , major depression disorder ---insomnia  ---Binge eating d/o ---Psychogenic polydipsia  --- admitted 2014 , admitted 03-2016  IBS Chronic headaches MSK: Chronic back pain Shellfish Allergy Stroke?  concerned about a stroke in 2014  >> MRI (-) saw neuro (no issues found) Concerned again ~07-2015, CT head neg ; saw neuro 01-17-2016 , sx felt to be psych, rx a MRI: wnl   PLAN: HTN: Continue losartan, seems control, checking labs Anxiety, major depression disorder, insomnia: Currently not taking Abilify, BuSpar or Zoloft, has not seen psychiatry or a counselor in a while moslly d/t finances; also he felt he was not getting much out of counseling and felt that Zoloft was making him more tired.  At this point he is okay, no suicidal ideas. I encouraged him a more proactive approach >> take medications regulalrly before he gets more depressed and suicidal. If he does get suicidal rec a ER visit. Offered a psych  referral, declined  RTC 6 months

## 2016-12-24 NOTE — Assessment & Plan Note (Signed)
HTN: Continue losartan, seems control, checking labs Anxiety, major depression disorder, insomnia: Currently not taking Abilify, BuSpar or Zoloft, has not seen psychiatry or a counselor in a while moslly d/t finances; also he felt he was not getting much out of counseling and felt that Zoloft was making him more tired.  At this point he is okay, no suicidal ideas. I encouraged him a more proactive approach >> take medications regulalrly before he gets more depressed and suicidal. If he does get suicidal rec a ER visit. Offered a psych  referral, declined  RTC 6 months

## 2016-12-26 MED ORDER — VITAMIN D (ERGOCALCIFEROL) 1.25 MG (50000 UNIT) PO CAPS: 50000.0000 [IU] | ORAL_CAPSULE | ORAL | 0 refills | Status: AC

## 2016-12-26 NOTE — Addendum Note (Signed)
Addended by[facility]rad [facility]: Ileah Falkenstein D on: 12/26/2016 08:11 AM   Modules accepted: Orders

## 2017-03-26 ENCOUNTER — Other Ambulatory Visit: Payer: Self-pay

## 2017-04-04 ENCOUNTER — Other Ambulatory Visit: Payer: Self-pay | Admitting: Internal Medicine

## 2017-04-05 ENCOUNTER — Other Ambulatory Visit: Payer: Self-pay

## 2017-06-22 ENCOUNTER — Telehealth: Payer: Self-pay

## 2017-06-22 ENCOUNTER — Ambulatory Visit: Payer: Self-pay | Admitting: Internal Medicine

## 2017-06-22 DIAGNOSIS — Z0289 Encounter for other administrative examinations: Secondary | ICD-10-CM

## 2017-06-22 NOTE — Telephone Encounter (Signed)
Multiple no shows/cancellations:  06/22/2017: follow-up- no show 04/05/2017- lab appt- no show 03/26/2017- lab appt- no show 12/05/2017-cpe- no show 08/08/2016-cpe- cancel 07/14/2016-cpe-cancel  Would you like to begin dismissal process?

## 2017-06-25 NOTE — Telephone Encounter (Signed)
Multiple no shows, start dismissal process

## 2017-06-25 NOTE — Telephone Encounter (Signed)
Dismissal letter printed,signed and forwarded to [place], Research officer, political partypractice administrator.

## 2017-07-04 ENCOUNTER — Telehealth: Payer: Self-pay | Admitting: Internal Medicine

## 2017-07-04 NOTE — Telephone Encounter (Signed)
Patient dismissed from [facility] by Willow OraJose Paz MD , effective June 25, 2017. Dismissal letter sent out by certified / registered mail.  daj

## 2017-07-18 NOTE — Telephone Encounter (Signed)
Received signed domestic return receipt verifying delivery of certified letter on July 12, 2017. Article number [id number] [phone] daj

## 2021-04-23 ENCOUNTER — Encounter (HOSPITAL_COMMUNITY): Payer: Self-pay

## 2021-04-23 ENCOUNTER — Emergency Department (HOSPITAL_COMMUNITY)
Admission: EM | Admit: 2021-04-23 | Discharge: 2021-04-24 | Disposition: A | Discharge status: Home / Self Care | Payer: Self-pay | Attending: Emergency Medicine | Admitting: Emergency Medicine

## 2021-04-23 ENCOUNTER — Other Ambulatory Visit: Payer: Self-pay

## 2021-04-23 DIAGNOSIS — K5900 Constipation, unspecified: Secondary | ICD-10-CM | POA: Insufficient documentation

## 2021-04-23 DIAGNOSIS — R1010 Upper abdominal pain, unspecified: Secondary | ICD-10-CM

## 2021-04-23 DIAGNOSIS — Z79899 Other long term (current) drug therapy: Secondary | ICD-10-CM | POA: Insufficient documentation

## 2021-04-23 DIAGNOSIS — R1012 Left upper quadrant pain: Secondary | ICD-10-CM | POA: Insufficient documentation

## 2021-04-23 DIAGNOSIS — Z8673 Personal history of transient ischemic attack (TIA), and cerebral infarction without residual deficits: Secondary | ICD-10-CM | POA: Insufficient documentation

## 2021-04-23 DIAGNOSIS — I10 Essential (primary) hypertension: Secondary | ICD-10-CM | POA: Insufficient documentation

## 2021-04-23 DIAGNOSIS — R110 Nausea: Secondary | ICD-10-CM | POA: Insufficient documentation

## 2021-04-23 DIAGNOSIS — R1011 Right upper quadrant pain: Secondary | ICD-10-CM | POA: Insufficient documentation

## 2021-04-23 DIAGNOSIS — Z7982 Long term (current) use of aspirin: Secondary | ICD-10-CM | POA: Insufficient documentation

## 2021-04-23 LAB — CBC
HCT: 41.6 % (ref 39.0–52.0)
Hemoglobin: 14.8 g/dL (ref 13.0–17.0)
MCH: 31.9 pg (ref 26.0–34.0)
MCHC: 35.6 g/dL (ref 30.0–36.0)
MCV: 89.7 fL (ref 80.0–100.0)
Platelets: 294 10*3/uL (ref 150–400)
RBC: 4.64 MIL/uL (ref 4.22–5.81)
RDW: 12 % (ref 11.5–15.5)
WBC: 8.1 10*3/uL (ref 4.0–10.5)
nRBC: 0 % (ref 0.0–0.2)

## 2021-04-23 LAB — URINALYSIS, ROUTINE W REFLEX MICROSCOPIC
Bilirubin Urine: NEGATIVE
Glucose, UA: NEGATIVE mg/dL
Hgb urine dipstick: NEGATIVE
Ketones, ur: 20 mg/dL — AB
Leukocytes,Ua: NEGATIVE
Nitrite: NEGATIVE
Protein, ur: NEGATIVE mg/dL
Specific Gravity, Urine: 1.004 — ABNORMAL LOW (ref 1.005–1.030)
pH: 6 (ref 5.0–8.0)

## 2021-04-23 LAB — COMPREHENSIVE METABOLIC PANEL
ALT: 20 U/L (ref 0–44)
AST: 22 U/L (ref 15–41)
Albumin: 4.3 g/dL (ref 3.5–5.0)
Alkaline Phosphatase: 60 U/L (ref 38–126)
Anion gap: 9 (ref 5–15)
BUN: 8 mg/dL (ref 6–20)
CO2: 26 mmol/L (ref 22–32)
Calcium: 9.2 mg/dL (ref 8.9–10.3)
Chloride: 94 mmol/L — ABNORMAL LOW (ref 98–111)
Creatinine, Ser: 0.78 mg/dL (ref 0.61–1.24)
GFR, Estimated: >60 mL/min (ref >60)
Glucose, Bld: 100 mg/dL — ABNORMAL HIGH (ref 70–99)
Potassium: 3.5 mmol/L (ref 3.5–5.1)
Sodium: 129 mmol/L — ABNORMAL LOW (ref 135–145)
Total Bilirubin: 1.1 mg/dL (ref 0.3–1.2)
Total Protein: 7.3 g/dL (ref 6.5–8.1)

## 2021-04-23 LAB — LIPASE, BLOOD: Lipase: 38 U/L (ref 11–51)

## 2021-04-23 NOTE — ED Triage Notes (Signed)
Pt c/o upper abdominal pain that started yesterday. Pt states he has not had a bowel movement in 24hrs which is abnormal for him.  Pt has had nausea.

## 2021-04-23 NOTE — ED Provider Notes (Signed)
MSE was initiated and I personally evaluated the patient and placed orders (if any) at  10:07 PM on Apr 23, 2021.  Patient here with abdominal pain x 1 week.  States he feels like his kidneys are causing him pain.  Reports constipation since yesterday.  ROS: As listed above  PE: Alert and oriented Answers questions appropriately No respiratory distress No focal abdominal tenderness  Discussed with patient that their care has been initiated.   They are counseled that they will need to remain in the ED until the completion of their workup, including full H&P and results of any tests.  Risks of leaving the emergency department prior to completion of treatment were discussed. Patient was advised to inform ED staff if they are leaving before their treatment is complete. The patient acknowledged these risks and time was allowed for questions.    The patient appears stable so that the remainder of the MSE may be completed by another provider.    Roxy Horseman, PA-C 04/23/21 2208    Jacalyn Lefevre, MD 04/23/21 2224

## 2021-04-24 ENCOUNTER — Other Ambulatory Visit: Payer: Self-pay

## 2021-04-24 NOTE — Discharge Instructions (Addendum)

## 2021-04-24 NOTE — ED Provider Notes (Signed)
[facility] EMERGENCY DEPARTMENT Provider Note   CSN: [id number] Arrival date & time: 04/23/21  2104     History Chief Complaint  Patient presents with  . Abdominal Pain    Edwin Vaughan is a 50 y.o. male.  The history is provided by the patient.  Abdominal Pain Pain location:  LUQ and RUQ Pain quality: aching and cramping   Pain radiates to:  Does not radiate Pain severity:  Moderate Onset quality:  Gradual Duration:  1 week Timing:  Intermittent Progression:  Improving Chronicity:  New Context: not alcohol use   Relieved by:  Nothing Worsened by:  Nothing Associated symptoms: constipation and nausea   Associated symptoms: no chest pain, no diarrhea, no dysuria, no fever, no hematochezia, no melena and no vomiting   Risk factors: aspirin   Risk factors: no alcohol abuse, has not had multiple surgeries and no NSAID use   Patient reports episodes of abdominal pain for the past week.  He first thought it might be his kidneys.  He also reports he has not had a bowel movement since yesterday No fevers or vomiting. No previous abdominal surgeries    Patient reports he has been experiencing almost daily TIAs for the past several years.  He reports he will have memory changes and sometimes loses peripheral vision.  He reports this happens frequently.  He has no new symptoms at this time.  No new focal weakness is reported.  He reports he had previous work-ups was lost to follow-up after he lost his insurance Past Medical History:  Diagnosis Date  . Arthritis    lower back   . Complication of anesthesia    FYI: Egg and shellfish allergies  . Depression    was on Lexapro but stopped a few months ago  . Environmental allergies    "pretty much anything that grows, I'm allergic to it" (06/17/2013)  . Headache(784.0)    "most days recently related to my blood pressure" (06/17/2013)  . Hypertension    takes Losartan daily  . IBS (irritable bowel syndrome)   .  Insomnia    doesn't take any meds  . Nocturia   . 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 Vitamin D deficiency    takes Vit D weekly  . Weakness    both hands and left leg .Numbness and tingling    Patient Active Problem List   Diagnosis Date Noted  . Severe episode of recurrent major depressive disorder, without psychotic features (HCC)   . 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 Snoring 03/13/2016  . Gasping for breath 03/13/2016  . Fatigue 03/01/2016  . PCP NOTES >>>>>>>>>>>>>>>>>>>>>>>>>>>>>>>. 02/06/2016  . Generalized anxiety disorder 01/18/2016  . Left-sided weakness 01/17/2016  . Word finding difficulty 01/17/2016  . Blurred vision 01/17/2016  . Swallowing difficulty 01/17/2016  . Annual physical exam 08/10/2015  . Hypertension 08/05/2015  . Scrotal mass 08/19/2013  . 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 IBS (irritable bowel syndrome) 06/22/2013  . Neck mass 07/01/2012    Past Surgical History:  Procedure Laterality Date  . COLONOSCOPY    . RADIOLOGY WITH ANESTHESIA N/A 03/02/2016   Procedure: MRI OF BRAIN W/WO    (RADIOLOGY WITH ANESTHESIA);  Surgeon: Medication Radiologist, MD;  Location: [facility] OR;  Service: Radiology;  Laterality: N/A;  . WISDOM TOOTH EXTRACTION  2000's       Family History  Problem Relation Age of Onset  . Hypertension Mother   . Stroke Mother   .  Colon cancer Father 14  . Prostate cancer Neg Hx   . Diabetes Neg Hx     Social History   Tobacco Use  . Smoking status: Never Smoker  . Smokeless tobacco: Never Used  Substance Use Topics  . Alcohol use: No    Comment: 06/17/2013 "1-2 drinks/yr"  . Drug use: No    Home Medications Prior to Admission medications   Medication Sig Start Date End Date Taking? Authorizing Provider  aspirin 81 MG tablet Take 81 mg by mouth daily.    [provider]  cholecalciferol (VITAMIN D) 1000 units tablet Take 1 tablet (1,000 Units total) by mouth daily. Patient not taking: Reported on  12/22/2016 04/15/16   Rankin, Shuvon B, NP  losartan (COZAAR) 50 MG tablet Take 1 tablet (50 mg total) by mouth daily. 04/04/17   Wanda Plump, MD  Vitamin D, Ergocalciferol, (DRISDOL) 50000 units CAPS capsule Take 1 capsule (50,000 Units total) by mouth every 7 (seven) days. For 12 weeks 12/26/16   Wanda Plump, MD    Allergies    Eggs or egg-derived products and Shellfish allergy  Review of Systems   Review of Systems  Constitutional: Negative for fever.  Cardiovascular: Negative for chest pain.  Gastrointestinal: Positive for abdominal pain, constipation and nausea. Negative for blood in stool, diarrhea, hematochezia, melena and vomiting.  Genitourinary: Negative for dysuria.  Neurological: Negative for headaches.  Psychiatric/Behavioral: Positive for confusion.  All other systems reviewed and are negative.   Physical Exam Updated Vital Signs BP (!) 122/92 (BP Location: Right Arm)   Pulse 72   Temp 98.6 F (37 C) (Oral)   Resp 16   SpO2 100%   Physical Exam CONSTITUTIONAL: Well developed/well nourished HEAD: Normocephalic/atraumatic EYES: EOMI/PERRL, no icterus ENMT: Mucous membranes moist NECK: supple no meningeal signs SPINE/BACK:entire spine nontender CV: S1/S2 noted, no murmurs/rubs/gallops noted LUNGS: Lungs are clear to auscultation bilaterally, no apparent distress ABDOMEN: soft, nontender, no rebound or guarding, bowel sounds noted throughout abdomen GU:no cva tenderness NEURO: Pt is awake/alert/appropriate, moves all extremitiesx4.  No facial droop.  No arm or leg drift.  Speech is fluent.  He follows all commands appropriately EXTREMITIES: pulses normal/equal, full ROM SKIN: warm, color normal PSYCH: no abnormalities of mood noted, alert and oriented to situation  ED Results / Procedures / Treatments   Labs (all labs ordered are listed, but only abnormal results are displayed) Labs Reviewed  COMPREHENSIVE METABOLIC PANEL - Abnormal; Notable for the following  components:      Result Value   Sodium 129 (*)    Chloride 94 (*)    Glucose, Bld 100 (*)    All other components within normal limits  URINALYSIS, ROUTINE W REFLEX MICROSCOPIC - Abnormal; Notable for the following components:   Color, Urine STRAW (*)    Specific Gravity, Urine 1.004 (*)    Ketones, ur 20 (*)    All other components within normal limits  LIPASE, BLOOD  CBC    EKG EKG Interpretation  Date/Time:  Sunday Apr 24 2021 01:59:29 EDT Ventricular Rate:  55 PR Interval:  188 QRS Duration: 92 QT Interval:  416 QTC Calculation: 397 R Axis:   17 Text Interpretation: Sinus bradycardia Otherwise normal ECG Confirmed by Zadie Rhine ([id number]) on 04/24/2021 2:05:52 AM   Radiology No results found.  Procedures Procedures   Medications Ordered in ED Medications - No data to display  ED Course  I have reviewed the triage vital signs and the nursing notes.  Pertinent labs  results that were available during my care of the patient were reviewed by me and considered in my medical decision making (see chart for details).    MDM Rules/Calculators/A&P                          2:10 AM Patient is already feeling improved, the labs reveal mild dehydration  he declines imaging at this time.  No focal tenderness on exam.  We will trial with p.o. fluids and reassess.  Also reports he gets TIAs frequently.  Will refer him to a PCP as well as neurology as an outpatient.  No signs of acute stroke at this time. Previous MRI brain has been negative in the past  3:11 AM Patient is improved. He is ambulatory.  He is taking p.o. fluids No vomiting. Advised the patient is dehydrated to increase his p.o. fluids. Below is his phone number for [phone]  Patient is appropriate for d/c home.  I doubt acute abdominal emergency at this time.  We discussed strict ER return precautions including abdominal pain that migrates to RLQ, fever >100.1F with repetitive vomiting over next  8-12 hours Final Clinical Impression(s) / ED Diagnoses Final diagnoses:  Pain of upper abdomen    Rx / DC Orders ED Discharge Orders         Ordered    Ambulatory referral to Neurology       Comments: An appointment is requested in approximately: 2 weeks   04/24/21 0207           Zadie Rhine, MD 04/24/21 [phone]

## 2021-04-26 ENCOUNTER — Encounter (HOSPITAL_COMMUNITY): Payer: Self-pay

## 2021-04-26 ENCOUNTER — Emergency Department (HOSPITAL_COMMUNITY): Payer: Self-pay

## 2021-04-26 ENCOUNTER — Emergency Department (HOSPITAL_COMMUNITY)
Admission: EM | Admit: 2021-04-26 | Discharge: 2021-04-26 | Disposition: A | Discharge status: Home / Self Care | Payer: Self-pay | Attending: Emergency Medicine | Admitting: Emergency Medicine

## 2021-04-26 ENCOUNTER — Other Ambulatory Visit: Payer: Self-pay

## 2021-04-26 DIAGNOSIS — R197 Diarrhea, unspecified: Secondary | ICD-10-CM | POA: Insufficient documentation

## 2021-04-26 DIAGNOSIS — R110 Nausea: Secondary | ICD-10-CM | POA: Insufficient documentation

## 2021-04-26 DIAGNOSIS — Z79899 Other long term (current) drug therapy: Secondary | ICD-10-CM | POA: Insufficient documentation

## 2021-04-26 DIAGNOSIS — R1013 Epigastric pain: Secondary | ICD-10-CM | POA: Insufficient documentation

## 2021-04-26 DIAGNOSIS — Z7982 Long term (current) use of aspirin: Secondary | ICD-10-CM | POA: Insufficient documentation

## 2021-04-26 DIAGNOSIS — I10 Essential (primary) hypertension: Secondary | ICD-10-CM | POA: Insufficient documentation

## 2021-04-26 LAB — COMPREHENSIVE METABOLIC PANEL
ALT: 20 U/L (ref 0–44)
AST: 22 U/L (ref 15–41)
Albumin: 4.3 g/dL (ref 3.5–5.0)
Alkaline Phosphatase: 55 U/L (ref 38–126)
Anion gap: 9 (ref 5–15)
BUN: 7 mg/dL (ref 6–20)
CO2: 25 mmol/L (ref 22–32)
Calcium: 9.3 mg/dL (ref 8.9–10.3)
Chloride: 94 mmol/L — ABNORMAL LOW (ref 98–111)
Creatinine, Ser: 0.79 mg/dL (ref 0.61–1.24)
GFR, Estimated: >60 mL/min (ref >60)
Glucose, Bld: 114 mg/dL — ABNORMAL HIGH (ref 70–99)
Potassium: 3.5 mmol/L (ref 3.5–5.1)
Sodium: 128 mmol/L — ABNORMAL LOW (ref 135–145)
Total Bilirubin: 1.1 mg/dL (ref 0.3–1.2)
Total Protein: 7.3 g/dL (ref 6.5–8.1)

## 2021-04-26 LAB — CBC
HCT: 43.4 % (ref 39.0–52.0)
Hemoglobin: 15.4 g/dL (ref 13.0–17.0)
MCH: 31.6 pg (ref 26.0–34.0)
MCHC: 35.5 g/dL (ref 30.0–36.0)
MCV: 88.9 fL (ref 80.0–100.0)
Platelets: 271 10*3/uL (ref 150–400)
RBC: 4.88 MIL/uL (ref 4.22–5.81)
RDW: 11.9 % (ref 11.5–15.5)
WBC: 4.8 10*3/uL (ref 4.0–10.5)
nRBC: 0 % (ref 0.0–0.2)

## 2021-04-26 LAB — URINALYSIS, ROUTINE W REFLEX MICROSCOPIC
Bacteria, UA: NONE SEEN
Bilirubin Urine: NEGATIVE
Glucose, UA: NEGATIVE mg/dL
Ketones, ur: 20 mg/dL — AB
Leukocytes,Ua: NEGATIVE
Nitrite: NEGATIVE
Protein, ur: NEGATIVE mg/dL
Specific Gravity, Urine: 1.005 (ref 1.005–1.030)
pH: 6 (ref 5.0–8.0)

## 2021-04-26 LAB — TROPONIN I (HIGH SENSITIVITY)
Troponin I (High Sensitivity): 3 ng/L (ref <18)
Troponin I (High Sensitivity): 4 ng/L (ref <18)

## 2021-04-26 LAB — LIPASE, BLOOD: Lipase: 40 U/L (ref 11–51)

## 2021-04-26 MED ORDER — MORPHINE SULFATE (PF) 4 MG/ML IV SOLN
4.0000 mg | Freq: Once | INTRAVENOUS | Status: AC
  Administered 2021-04-26: 4 mg via INTRAVENOUS
  Filled 2021-04-26: qty 1

## 2021-04-26 MED ORDER — SODIUM CHLORIDE 0.9 % IV BOLUS
1000.0000 mL | Freq: Once | INTRAVENOUS | Status: AC
  Administered 2021-04-26: 1000 mL via INTRAVENOUS

## 2021-04-26 MED ORDER — ONDANSETRON HCL 4 MG/2ML IJ SOLN
4.0000 mg | Freq: Once | INTRAMUSCULAR | Status: AC
  Administered 2021-04-26: 4 mg via INTRAVENOUS
  Filled 2021-04-26: qty 2

## 2021-04-26 MED ORDER — PANTOPRAZOLE SODIUM 20 MG PO TBEC: 20.0000 mg | DELAYED_RELEASE_TABLET | Freq: Every day | ORAL | 0 refills | Status: AC

## 2021-04-26 NOTE — Discharge Instructions (Signed)
Take Protonix as directed.  Follow-up with referred GI doctor.  Return to emergency department for any worsening pain, vomiting, fever, difficulty breathing, chest pain or any other worsening concerning symptoms.

## 2021-04-26 NOTE — ED Notes (Signed)
Pt was ambulated with Thuy, NT. Pts O2 stayed between 98-99% on RA

## 2021-04-26 NOTE — ED Provider Notes (Signed)
[name] EMERGENCY DEPARTMENT Provider Note   CSN: [id number] Arrival date & time: 04/26/21  5784     History Chief Complaint  Patient presents with  . Abdominal Pain    Edwin Vaughan is a 50 y.o. male with PMH/o depression, arthritis, HTN, IBS who presents for evaluation of abdominal pain that has been ongoing for the last 10 days.  Patient reports that he first noticed it about 10 days ago and said he had some epigastric discomfort.  He states that it spreads across his upper abdomen.  He states that it will come in waves.  He was seen here on 04/24/2021 for evaluation of pain.  His lab work was unremarkable at that time.  He states that he went home and was continued to have pain.  He has not had any vomiting but has had some nausea.  He has some had some diarrhea but has a history of IBS that he thinks that may be causing it.  He has not noted any fevers, cough, chills.  He states he feels like occasionally the pain will radiate up into his chest.  He describes it as a "uncomfortable feeling underneath my breastbone." He states this is not brought on by exertion.  He states that when he gets a big episode of pain, it feels like it takes his breath away.  He states that at times, he feels like he is not breathing normally and has to take deeper breaths.  He denies any exogenous hormone use, recent immobilization, prior history of DVT/PE, recent surgery, leg swelling, or long travel.  He has a history of hypertension.  No diabetes.  Does not smoke.  No family history of MI.  No cardiac history.  The history is provided by the patient.       Past Medical History:  Diagnosis Date  . Arthritis    lower back   . Complication of anesthesia    FYI: Egg and shellfish allergies  . Depression    was on Lexapro but stopped a few months ago  . Environmental allergies    "pretty much anything that grows, I'm allergic to it" (06/17/2013)  . Headache(784.0)    "most days  recently related to my blood pressure" (06/17/2013)  . Hypertension    takes Losartan daily  . IBS (irritable bowel syndrome)   . Insomnia    doesn't take any meds  . Nocturia   . 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 Vitamin D deficiency    takes Vit D weekly  . Weakness    both hands and left leg .Numbness and tingling    Patient Active Problem List   Diagnosis Date Noted  . Severe episode of recurrent major depressive disorder, without psychotic features (HCC)   . 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 Snoring 03/13/2016  . Gasping for breath 03/13/2016  . Fatigue 03/01/2016  . PCP NOTES >>>>>>>>>>>>>>>>>>>>>>>>>>>>>>>. 02/06/2016  . Generalized anxiety disorder 01/18/2016  . Left-sided weakness 01/17/2016  . Word finding difficulty 01/17/2016  . Blurred vision 01/17/2016  . Swallowing difficulty 01/17/2016  . Annual physical exam 08/10/2015  . Hypertension 08/05/2015  . Scrotal mass 08/19/2013  . 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 IBS (irritable bowel syndrome) 06/22/2013  . Neck mass 07/01/2012    Past Surgical History:  Procedure Laterality Date  . COLONOSCOPY    . RADIOLOGY WITH ANESTHESIA N/A 03/02/2016   Procedure: MRI OF BRAIN W/WO    (RADIOLOGY WITH ANESTHESIA);  Surgeon:  Medication Radiologist, MD;  Location: [facility] OR;  Service: Radiology;  Laterality: N/A;  . WISDOM TOOTH EXTRACTION  2000's       Family History  Problem Relation Age of Onset  . Hypertension Mother   . Stroke Mother   . Colon cancer Father 42  . Prostate cancer Neg Hx   . Diabetes Neg Hx     Social History   Tobacco Use  . Smoking status: Never Smoker  . Smokeless tobacco: Never Used  Substance Use Topics  . Alcohol use: No    Comment: 06/17/2013 "1-2 drinks/yr"  . Drug use: No    Home Medications Prior to Admission medications   Medication Sig Start Date End Date Taking? Authorizing Provider  Ascorbic Acid (VITAMIN C PO) Take 1 tablet by mouth daily.   Yes [provider]  aspirin 81 MG tablet Take 81 mg by mouth daily.   Yes [provider]  B Complex Vitamins (B COMPLEX PO) Take 1 tablet by mouth daily.   Yes [provider]  cholecalciferol (VITAMIN D) 1000 units tablet Take 1 tablet (1,000 Units total) by mouth daily. 04/15/16  Yes Rankin, Shuvon B, NP  losartan (COZAAR) 50 MG tablet Take 1 tablet (50 mg total) by mouth daily. 04/04/17  Yes Paz, Nolon Rod, MD  pantoprazole (PROTONIX) 20 MG tablet Take 1 tablet (20 mg total) by mouth daily. 04/26/21 05/26/21 Yes Maxwell Caul, PA-C  Probiotic Product (PROBIOTIC PO) Take 1 capsule by mouth daily.   Yes [provider]  Vitamin D, Ergocalciferol, (DRISDOL) 50000 units CAPS capsule Take 1 capsule (50,000 Units total) by mouth every 7 (seven) days. For 12 weeks Patient not taking: No sig reported 12/26/16   Wanda Plump, MD    Allergies    Eggs or egg-derived products and Shellfish allergy  Review of Systems   Review of Systems  Constitutional: Negative for fever.  Respiratory: Positive for shortness of breath. Negative for cough.   Cardiovascular: Positive for chest pain.  Gastrointestinal: Positive for abdominal pain and nausea. Negative for vomiting.  Genitourinary: Negative for dysuria and hematuria.  Neurological: Negative for headaches.  All other systems reviewed and are negative.   Physical Exam Updated Vital Signs BP 118/89   Pulse 79   Temp 97.9 F (36.6 C) (Oral)   Resp 16   SpO2 100%   Physical Exam Vitals and nursing note reviewed.  Constitutional:      Appearance: Normal appearance. He is well-developed.  HENT:     Head: Normocephalic and atraumatic.  Eyes:     General: Lids are normal.     Conjunctiva/sclera: Conjunctivae normal.     Pupils: Pupils are equal, round, and reactive to light.  Cardiovascular:     Rate and Rhythm: Normal rate and regular rhythm.     Pulses: Normal pulses.          Radial pulses are 2+ on the right side and 2+  on the left side.       Dorsalis pedis pulses are 2+ on the right side and 2+ on the left side.     Heart sounds: Normal heart sounds. No murmur heard. No friction rub. No gallop.   Pulmonary:     Effort: Pulmonary effort is normal.     Breath sounds: Normal breath sounds.     Comments: Lungs clear to auscultation bilaterally.  Symmetric chest rise.  No wheezing, rales, rhonchi. Abdominal:     Palpations: Abdomen is soft.  Abdomen is not rigid.     Tenderness: There is abdominal tenderness in the epigastric area. There is no guarding.     Comments: Abdomen soft, nondistended.  Tenderness palpation in  Musculoskeletal:        General: Normal range of motion.     Cervical back: Full passive range of motion without pain.     Comments: BLE are symmetric in appearance without overlying warmth, erythema, edema.   Skin:    General: Skin is warm and dry.     Capillary Refill: Capillary refill takes less than 2 seconds.  Neurological:     Mental Status: He is alert and oriented to person, place, and time.  Psychiatric:        Speech: Speech normal.       ED Results / Procedures / Treatments   Labs (all labs ordered are listed, but only abnormal results are displayed) Labs Reviewed  COMPREHENSIVE METABOLIC PANEL - Abnormal; Notable for the following components:      Result Value   Sodium 128 (*)    Chloride 94 (*)    Glucose, Bld 114 (*)    All other components within normal limits  URINALYSIS, ROUTINE W REFLEX MICROSCOPIC - Abnormal; Notable for the following components:   Hgb urine dipstick SMALL (*)    Ketones, ur 20 (*)    All other components within normal limits  LIPASE, BLOOD  CBC  TROPONIN I (HIGH SENSITIVITY)  TROPONIN I (HIGH SENSITIVITY)    EKG None  Radiology CT ABDOMEN PELVIS WO CONTRAST  Result Date: 04/26/2021 CLINICAL DATA:  Abdominal pain. EXAM: CT ABDOMEN AND PELVIS WITHOUT CONTRAST TECHNIQUE: Multidetector CT imaging of the abdomen and pelvis was performed  following the standard protocol without IV contrast. COMPARISON:  None. FINDINGS: Lower chest: The lung bases are clear of acute process. No pleural effusion or pulmonary lesions. The heart is normal in size. No pericardial effusion. The distal esophagus and aorta are unremarkable. Hepatobiliary: No hepatic lesions or intrahepatic biliary dilatation. The gallbladder is unremarkable. No common bile duct dilatation. Pancreas: No mass, inflammation or ductal dilatation. Spleen: Normal size.  No focal lesions. Adrenals/Urinary Tract: The adrenal glands are normal. Horseshoe kidney deformity noted but no renal calculi or hydroureteronephrosis. The bladder is unremarkable. Stomach/Bowel: The stomach, duodenum, small and colon are unremarkable. No acute inflammatory changes, mass lesions or obstructive findings. The terminal ileum is normal. The appendix is normal. Vascular/Lymphatic: The aorta is normal in caliber. No dissection. The branch vessels are patent. The major venous structures are patent. No mesenteric or retroperitoneal mass or adenopathy. Small scattered lymph nodes are noted. Reproductive: The prostate gland and seminal vesicles are unremarkable. Other: No pelvic mass or adenopathy. No free pelvic fluid collections. No inguinal mass or adenopathy. No abdominal wall hernia or subcutaneous lesions. 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 No acute bony findings. No worrisome bone lesions. IMPRESSION: 1. No acute abdominal/pelvic findings, mass lesions or adenopathy. 2. Horseshoe kidney deformity. 3. 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 Electronically Signed   By: Rudie Meyer M.D.   On: 04/26/2021 18:10   DG Chest 2 View  Result Date: 04/26/2021 CLINICAL DATA:  Chest and abdominal pain. EXAM: CHEST - 2 VIEW  COMPARISON:  None. FINDINGS: The cardiac silhouette, mediastinal and hilar contours are normal. The lungs are clear. No  pleural effusion. No pulmonary lesions. No pneumothorax. The bony thorax is intact. IMPRESSION: No acute cardiopulmonary findings. Electronically Signed   By: Rudie Meyer M.D.   On: 04/26/2021 18:05    Procedures Procedures   Medications Ordered in ED Medications  sodium chloride 0.9 % bolus 1,000 mL (0 mLs Intravenous Stopped 04/26/21 1927)  ondansetron (ZOFRAN) injection 4 mg (4 mg Intravenous Given 04/26/21 1804)  morphine 4 MG/ML injection 4 mg (4 mg Intravenous Given 04/26/21 1806)    ED Course  I have reviewed the triage vital signs and the nursing notes.  Pertinent labs & imaging results that were available during my care of the patient were reviewed by me and considered in my medical decision making (see chart for details).    MDM Rules/Calculators/A&P                          50 year old male who presents for evaluation of abdominal pain.  He states is been ongoing for about 10 days. He has had some associated nausea.  Was seen here 2 days ago and work-up was unremarkable.  Comes back because he continus to have pain.  He feels occasionally, the pain will go into his chest and he will have some trouble breathing. Patient is afebrile, non-toxic appearing, sitting comfortably on examination table. Vital signs reviewed and stable. On exam, he has some mild epigastric abdominal tenderness.  No rigidity, guarding.  No CVA tenderness.  He is in no distress.  History/physical exam not concerning for dissection.  Low suspicion for ACS etiology given atypical features.  Consider infectious process versus hepatobiliary allergy though low suspicion.  History/physical exam not concerning for PE.  Patient is PERC negative and is therefore low risk for PE.  Blood work ordered at triage.  Will obtain chest x-ray, EKG, troponin.  Additionally, will obtain CT on pelvis without contrast  given national shortage of iodine contrast.  Lipase is normal.  CMP shows sodium of 128, BUN and creatinine within normal limits.  UA shows small amount of hemoglobin.  No infectious etiology. Troponin is negative.   Chest x-ray negative for any acute abnormality.  CT on pelvis shows no acute finding.  There is horseshoe kidney deformity.  There is bilateral pars defect at L5 with grade 1 spondylolisthesis.  He also has some degenerative disc disease.  Discussed results with patient.  Informed him of CT findings.  Delta troponin is negative.  Patient ambulated in the ED while maintaining O2 sats between 98-99% on room air.  Had a long discussion with patient regarding his work-up here in the ED.  At this time, his work-up is unremarkable.  His vitals are stable.  He has no hypoxia, tachycardic here in the ED.  Discussed with him that his symptoms could be related to PUD.  He has history of IBS.  He does not currently follow with a GI doctor.  He states he treats his IBS holistically.  We will plan to start him on a PPI and give him outpatient referral to GI. At this time, patient exhibits no emergent life-threatening condition that require further evaluation in ED. Discussed patient with Dr. Nicanor Alcon who is agreeable to plan. Patient had ample opportunity for questions and discussion. All patient's questions were answered with full understanding. Strict return precautions discussed. Patient expresses understanding and agreement to plan.   This patient was evaluated during a time of global shortage of iodinated contrast media. Based on guidance from  the [employer], best practices, and local institutional approaches an alternative path for evaluating and managing the patient may have been employed in order to provide optimal care during this shortage. The current situation has been discussed with the patient.  Portions of this note were generated with Scientist, clinical (histocompatibility and immunogenetics)Dragon dictation software.  Dictation errors may occur despite best attempts at proofreading.  Final Clinical Impression(s) / ED Diagnoses Final diagnoses:  Epigastric abdominal pain    Rx / DC Orders ED Discharge Orders         Ordered    pantoprazole (PROTONIX) 20 MG tablet  Daily        04/26/21 2117           Rosana HoesLayden, Zeric Baranowski A, PA-C 04/26/21 2315    Palumbo, April, MD 04/26/21 2328

## 2021-04-26 NOTE — ED Triage Notes (Signed)
Pt reports ongoing abd discomfort. Pt seen here Saturday for the same. Feels he either has a kidney stone or liver issues.

## 2021-04-26 NOTE — ED Notes (Signed)
Patient transported to X-ray 

## 2021-04-26 NOTE — ED Notes (Signed)
PT ambulatory to bathroom

## 2022-11-28 IMAGING — CR DG CHEST 2V
2 series · 2 of 2 positions shown · non-contrast
Comparison: None.

CLINICAL DATA: Chest and abdominal pain.

EXAM:
CHEST - 2 VIEW

[chest pa]
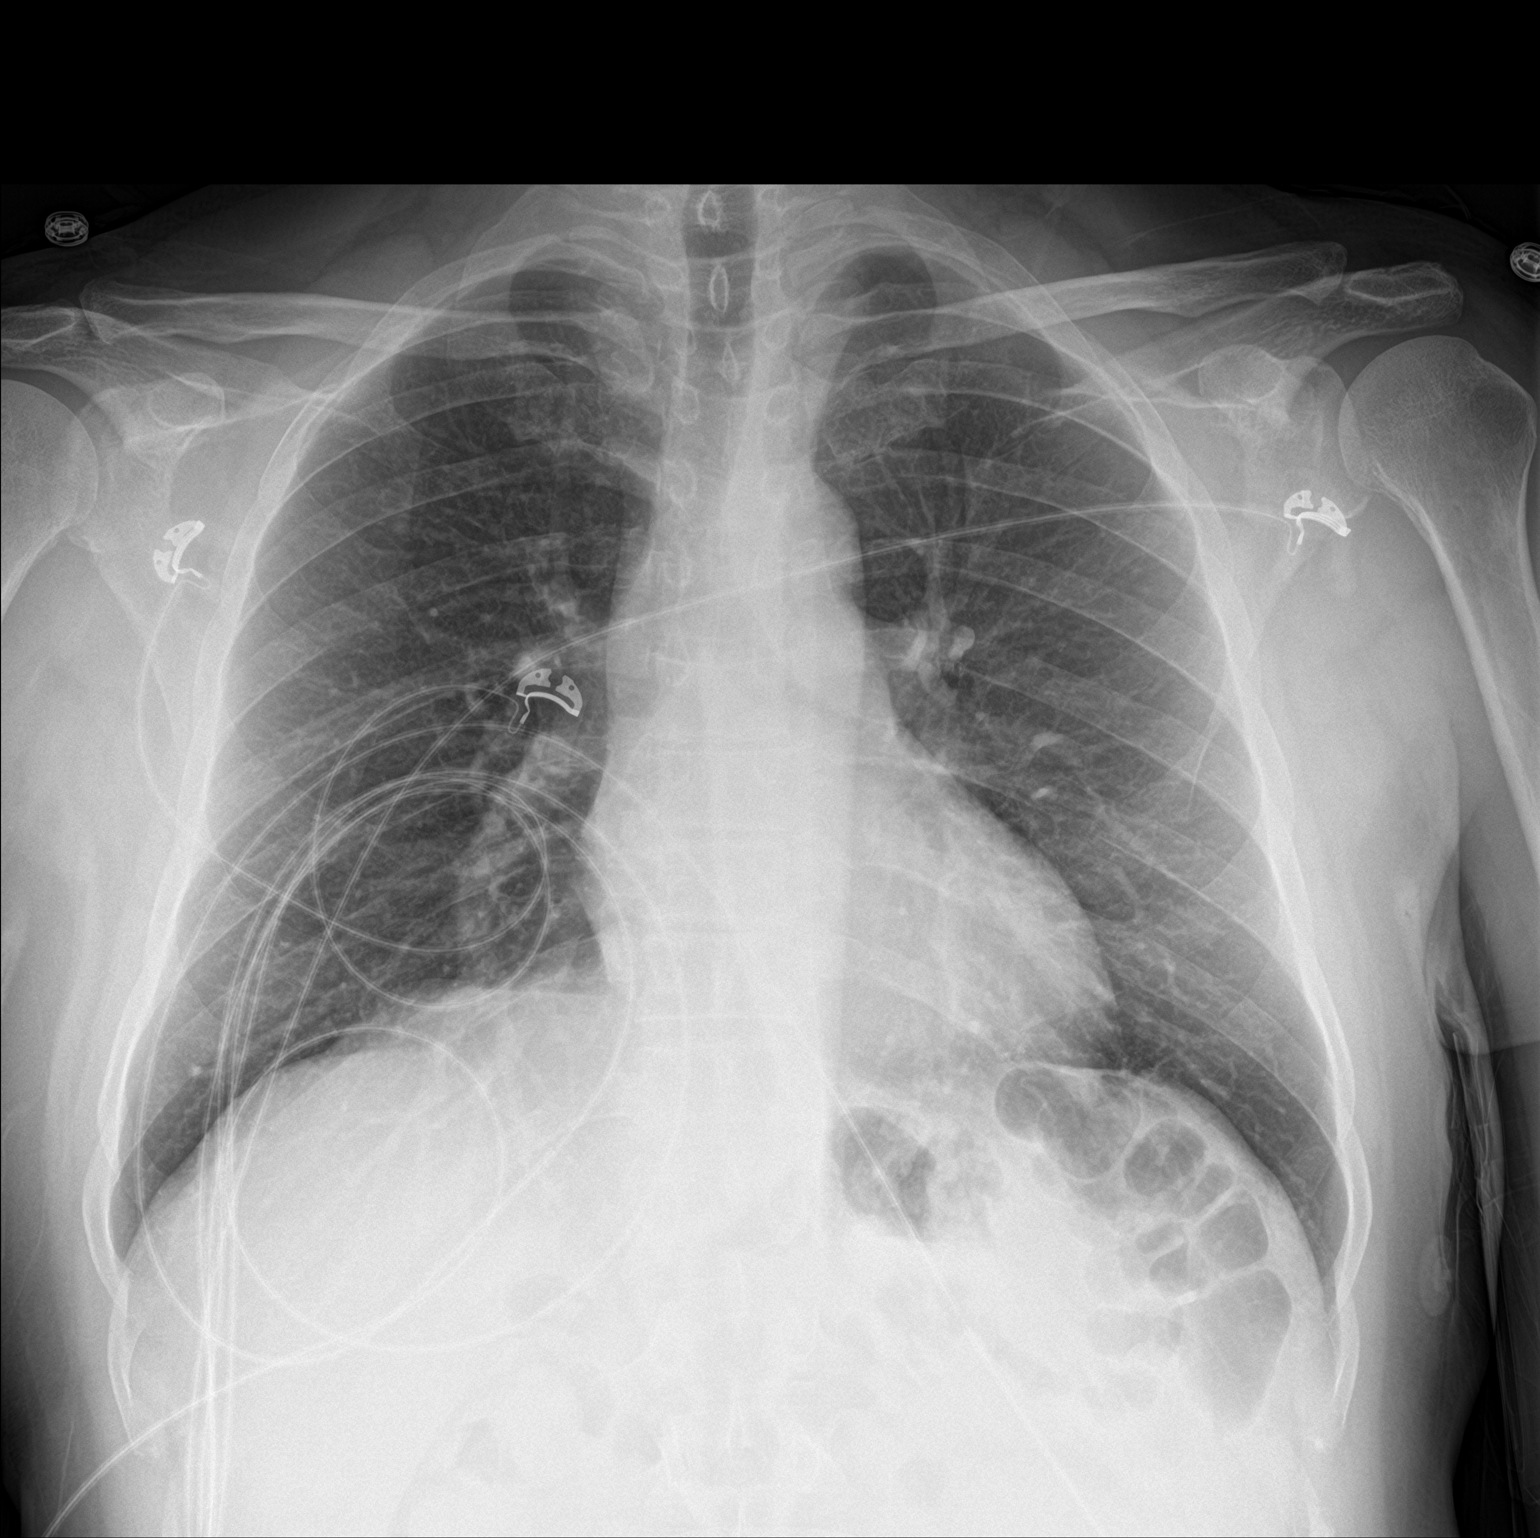

[chest lat]
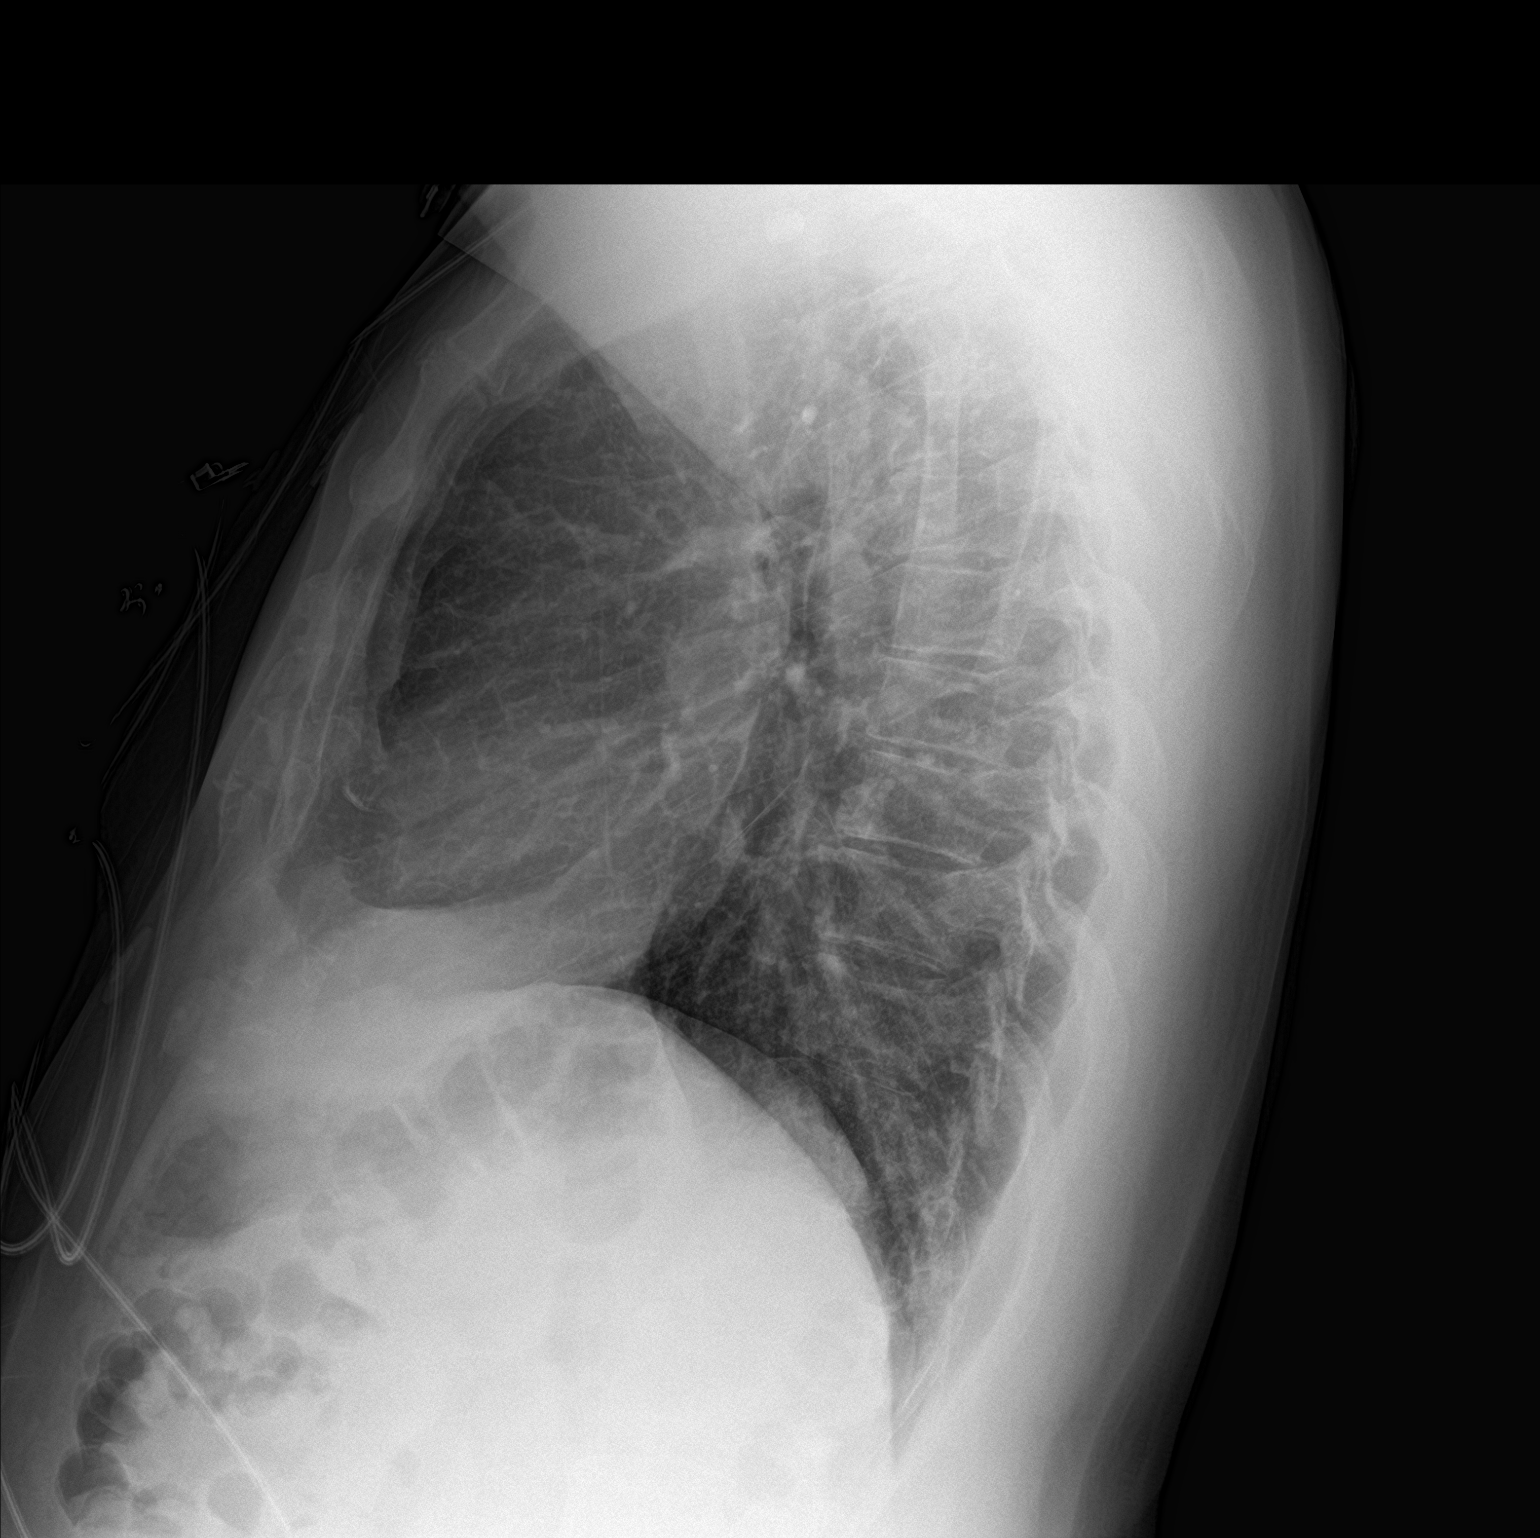

[2 of 2 positions shown; findings below may reference images not displayed]

FINDINGS: The cardiac silhouette, mediastinal and hilar contours are normal.
The lungs are clear. No pleural effusion. No pulmonary lesions. No
pneumothorax. The bony thorax is intact.
IMPRESSION: No acute cardiopulmonary findings.

## 2022-11-28 IMAGING — CT CT ABD-PELV W/O CM
2 of 4 series · 16 of 46 positions shown, 18 images · non-contrast
Comparison: None.

CLINICAL DATA: Abdominal pain.

EXAM:
CT ABDOMEN AND PELVIS WITHOUT CONTRAST
TECHNIQUE: Multidetector CT imaging of the abdomen and pelvis was performed
following the standard protocol without IV contrast.

[Series 3: a/p w/o 5mm · axial · non-contrast · 0.98mm/px · z∈[+978,+1388]mm · 13 of 92 slices shown, 15 images]
[im 5/92  soft-tissue]
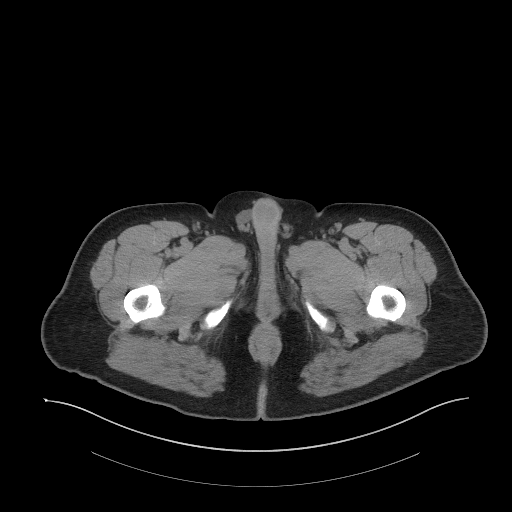
[im 5/92  bone]
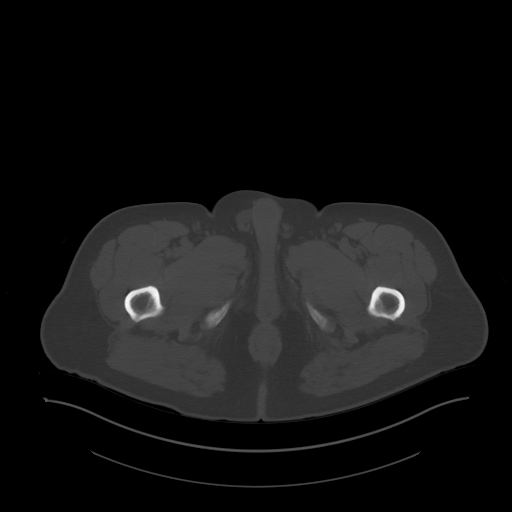
[im 13/92  soft-tissue]
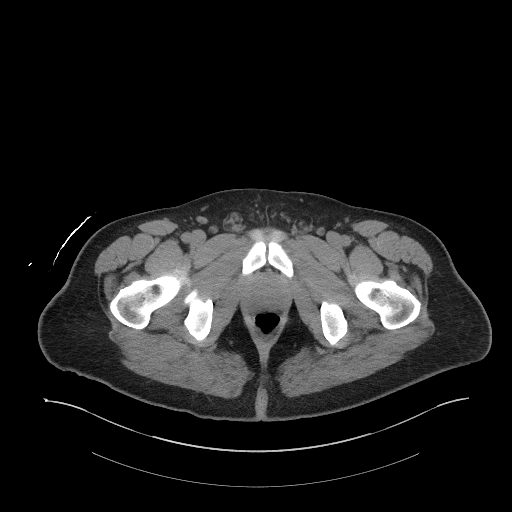
[im 21/92  soft-tissue]
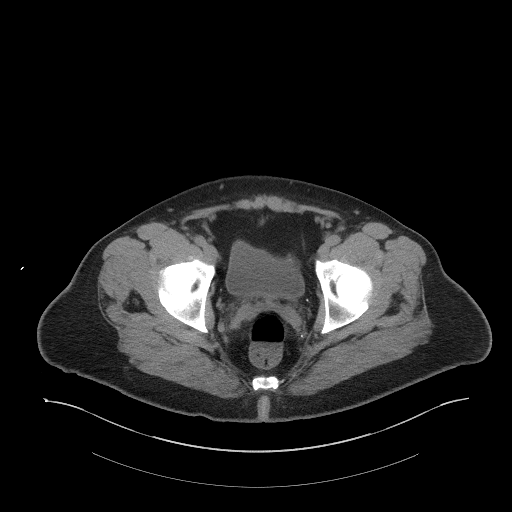
[im 25/92  soft-tissue]
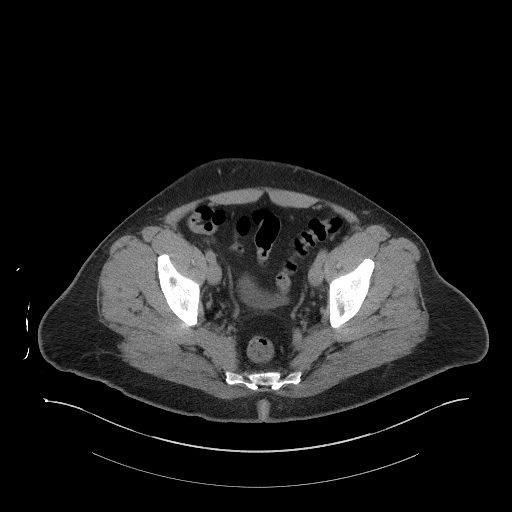
[im 34/92  soft-tissue]
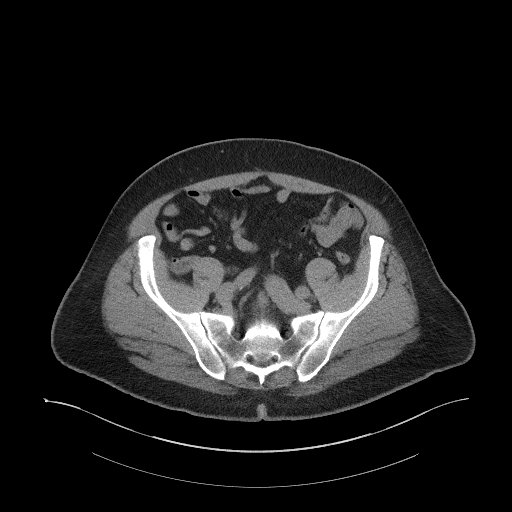
[im 38/92  soft-tissue]
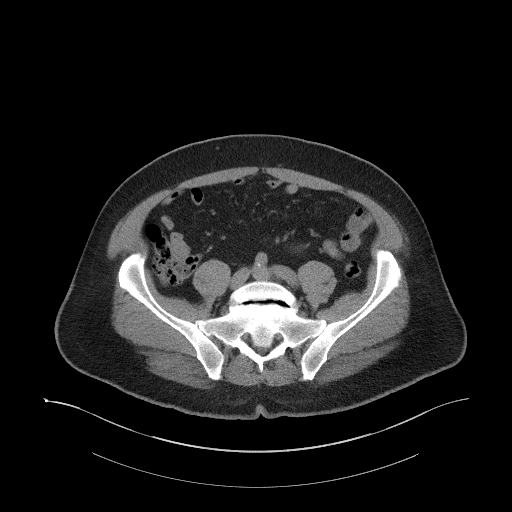
[im 46/92  soft-tissue]
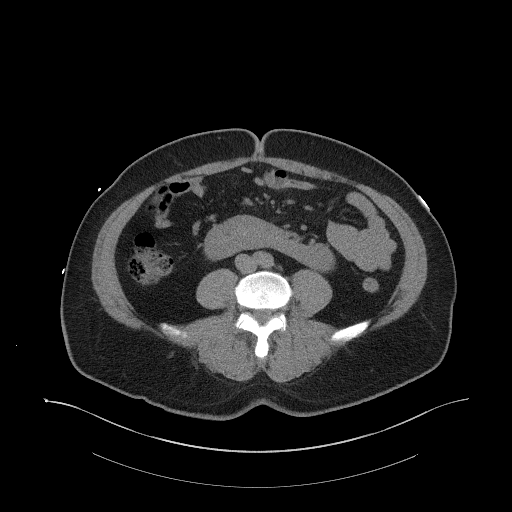
[im 54/92  soft-tissue]
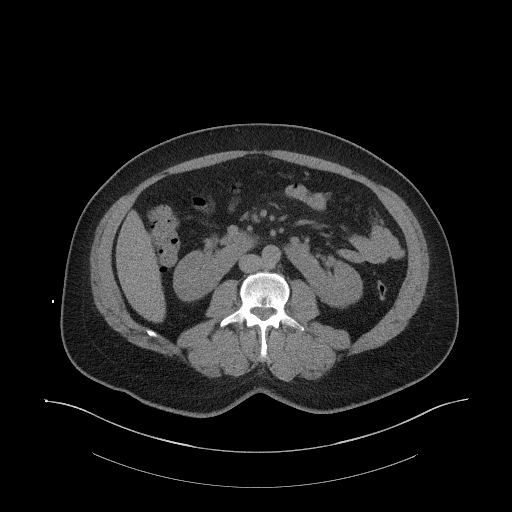
[im 58/92  soft-tissue]
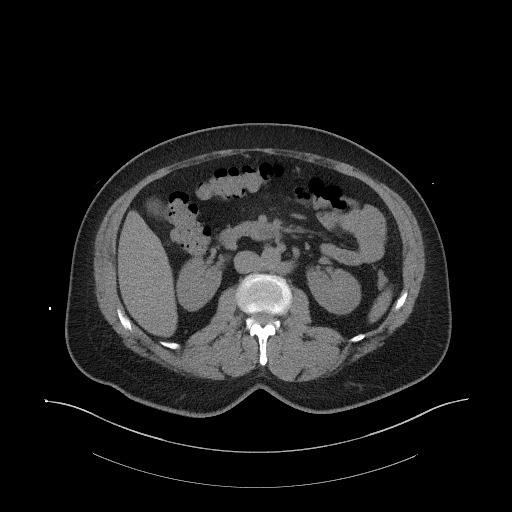
[im 58/92  bone]
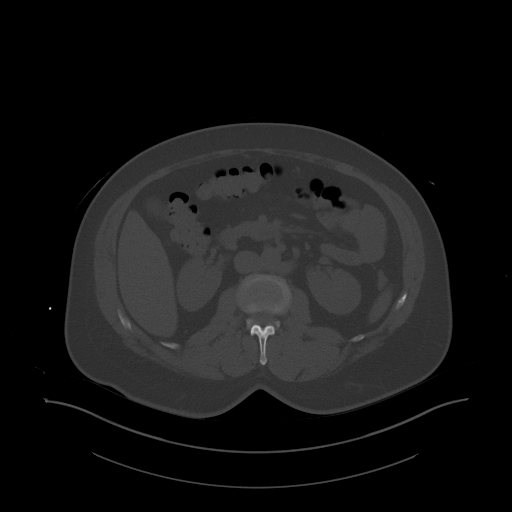
[im 67/92  soft-tissue]
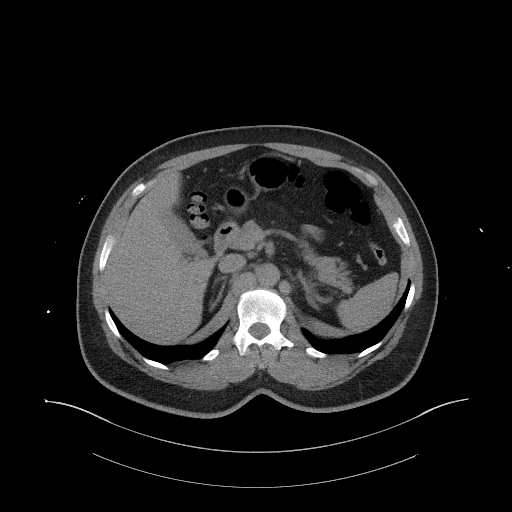
[im 71/92  soft-tissue]
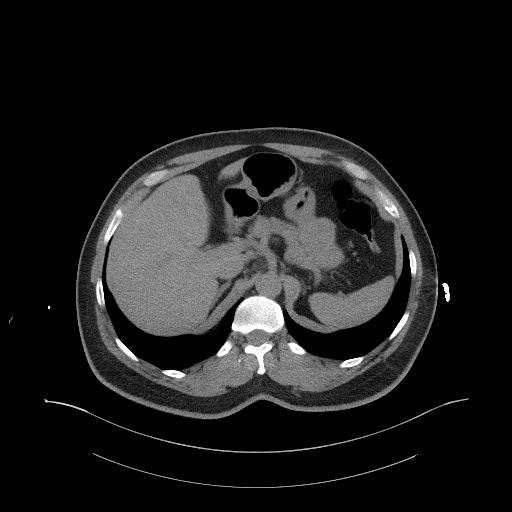
[im 79/92  soft-tissue]
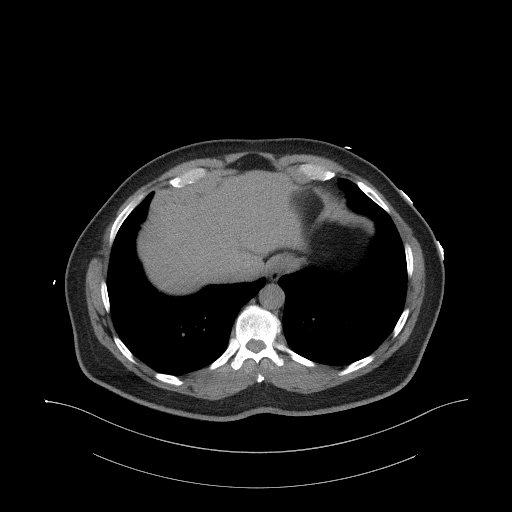
[im 87/92  soft-tissue]
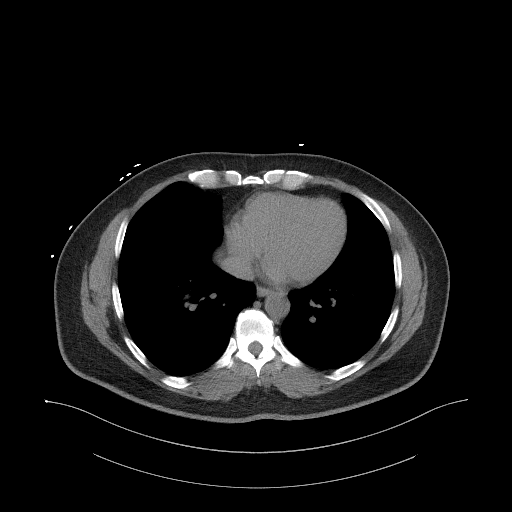

[Series 6: a/p w/o cor · coronal · non-contrast · 0.86mm/px · 3 of 155 slices shown]
[im 52/155  soft-tissue]
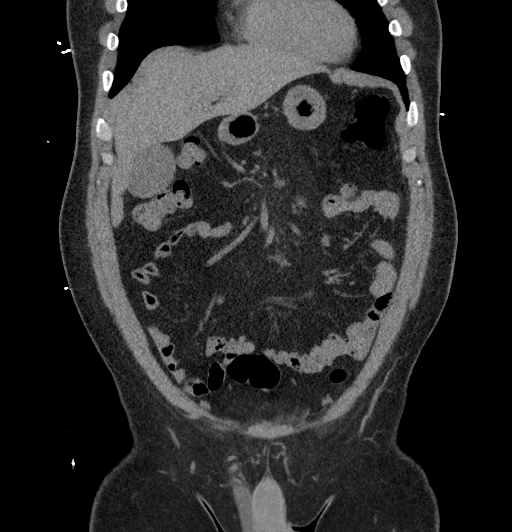
[im 69/155  soft-tissue]
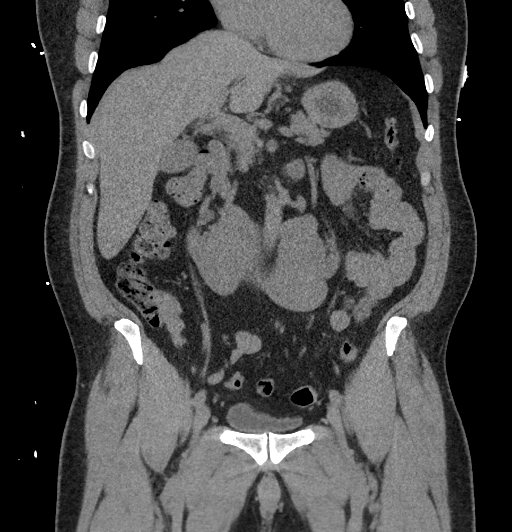
[im 86/155  soft-tissue]
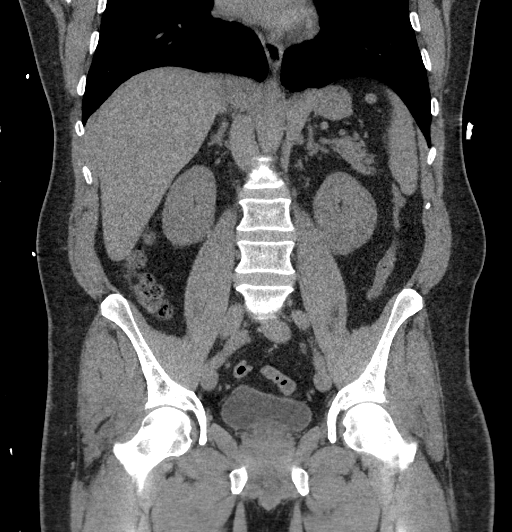

[16 of 46 positions shown; findings below may reference images not displayed]

FINDINGS: Lower chest: The lung bases are clear of acute process. No pleural
effusion or pulmonary lesions. The heart is normal in size. No
pericardial effusion. The distal esophagus and aorta are
unremarkable.

Hepatobiliary: No hepatic lesions or intrahepatic biliary
dilatation. The gallbladder is unremarkable. No common bile duct
dilatation.

Pancreas: No mass, inflammation or ductal dilatation.

Spleen: Normal size.  No focal lesions.

Adrenals/Urinary Tract: The adrenal glands are normal.

Horseshoe kidney deformity noted but no renal calculi or
hydroureteronephrosis. The bladder is unremarkable.

Stomach/Bowel: The stomach, duodenum, small and colon are
unremarkable. No acute inflammatory changes, mass lesions or
obstructive findings. The terminal ileum is normal. The appendix is
normal.

Vascular/Lymphatic: The aorta is normal in caliber. No dissection.
The branch vessels are patent. The major venous structures are
patent. No mesenteric or retroperitoneal mass or adenopathy. Small
scattered lymph nodes are noted.

Reproductive: The prostate gland and seminal vesicles are
unremarkable.

Other: No pelvic mass or adenopathy. No free pelvic fluid
collections. No inguinal mass or adenopathy. No abdominal wall
hernia or subcutaneous lesions.

Musculoskeletal: Bilateral pars defects at L5 with a grade 1
spondylolisthesis and associated degenerative disc disease and facet
disease at L5-S1. No acute bony findings. No worrisome bone lesions.
IMPRESSION: 1. No acute abdominal/pelvic findings, mass lesions or adenopathy.
2. Horseshoe kidney deformity.
3. Bilateral pars defects at L5 with a grade 1 spondylolisthesis and
associated degenerative disc disease and facet disease at L5-S1.

## 2023-06-21 DIAGNOSIS — K589 Irritable bowel syndrome without diarrhea: Secondary | ICD-10-CM | POA: Diagnosis not present

## 2023-06-21 DIAGNOSIS — I10 Essential (primary) hypertension: Secondary | ICD-10-CM | POA: Diagnosis not present

## 2024-06-10 DIAGNOSIS — R079 Chest pain, unspecified: Secondary | ICD-10-CM | POA: Diagnosis not present

## 2024-06-10 DIAGNOSIS — I10 Essential (primary) hypertension: Secondary | ICD-10-CM | POA: Diagnosis not present

## 2024-06-10 DIAGNOSIS — R0789 Other chest pain: Secondary | ICD-10-CM | POA: Diagnosis not present
